# Patient Record
Sex: Female | Born: 1950
Health system: Southern US, Community
[De-identification: ages and names within clinical notes are randomized; demographics above are authoritative.]

## PROBLEM LIST (undated history)

## (undated) DIAGNOSIS — I10 Essential (primary) hypertension: Secondary | ICD-10-CM

## (undated) DIAGNOSIS — N83209 Unspecified ovarian cyst, unspecified side: Secondary | ICD-10-CM

## (undated) DIAGNOSIS — L920 Granuloma annulare: Secondary | ICD-10-CM

## (undated) DIAGNOSIS — N946 Dysmenorrhea, unspecified: Secondary | ICD-10-CM

## (undated) DIAGNOSIS — K219 Gastro-esophageal reflux disease without esophagitis: Secondary | ICD-10-CM

## (undated) DIAGNOSIS — D069 Carcinoma in situ of cervix, unspecified: Secondary | ICD-10-CM

## (undated) DIAGNOSIS — E785 Hyperlipidemia, unspecified: Secondary | ICD-10-CM

## (undated) DIAGNOSIS — T7840XA Allergy, unspecified, initial encounter: Secondary | ICD-10-CM

## (undated) HISTORY — PX: NASAL SINUS SURGERY: SHX719

## (undated) HISTORY — PX: TUBAL LIGATION: SHX77

## (undated) HISTORY — DX: Gastro-esophageal reflux disease without esophagitis: K21.9

## (undated) HISTORY — DX: Hyperlipidemia, unspecified: E78.5

## (undated) HISTORY — DX: Dysmenorrhea, unspecified: N94.6

## (undated) HISTORY — DX: Carcinoma in situ of cervix, unspecified: D06.9

## (undated) HISTORY — PX: POLYPECTOMY: SHX149

## (undated) HISTORY — PX: FOOT SURGERY: SHX648

## (undated) HISTORY — DX: Essential (primary) hypertension: I10

## (undated) HISTORY — DX: Allergy, unspecified, initial encounter: T78.40XA

## (undated) HISTORY — DX: Granuloma annulare: L92.0

## (undated) HISTORY — PX: OVARIAN CYST SURGERY: SHX726

## (undated) HISTORY — PX: VAGINAL HYSTERECTOMY: SUR661

## (undated) HISTORY — DX: Unspecified ovarian cyst, unspecified side: N83.209

---

## 2001-06-20 ENCOUNTER — Encounter: Admission: RE | Admit: 2001-06-20 | Discharge: 2001-06-20 | Payer: Self-pay | Admitting: Family Medicine

## 2003-04-21 ENCOUNTER — Encounter: Payer: Self-pay | Admitting: Family Medicine

## 2003-04-21 ENCOUNTER — Encounter: Admission: RE | Admit: 2003-04-21 | Discharge: 2003-04-21 | Payer: Self-pay | Admitting: Family Medicine

## 2003-06-11 ENCOUNTER — Other Ambulatory Visit: Admission: RE | Admit: 2003-06-11 | Discharge: 2003-06-11 | Payer: Self-pay | Admitting: Obstetrics and Gynecology

## 2004-06-15 ENCOUNTER — Other Ambulatory Visit: Admission: RE | Admit: 2004-06-15 | Discharge: 2004-06-15 | Payer: Self-pay | Admitting: Obstetrics and Gynecology

## 2004-08-15 ENCOUNTER — Ambulatory Visit: Payer: Self-pay | Admitting: Family Medicine

## 2004-08-22 ENCOUNTER — Ambulatory Visit: Payer: Self-pay | Admitting: Family Medicine

## 2005-06-19 ENCOUNTER — Other Ambulatory Visit: Admission: RE | Admit: 2005-06-19 | Discharge: 2005-06-19 | Payer: Self-pay | Admitting: Obstetrics and Gynecology

## 2005-08-23 ENCOUNTER — Ambulatory Visit: Payer: Self-pay | Admitting: Family Medicine

## 2006-03-07 ENCOUNTER — Ambulatory Visit: Payer: Self-pay | Admitting: Family Medicine

## 2006-07-08 ENCOUNTER — Other Ambulatory Visit: Admission: RE | Admit: 2006-07-08 | Discharge: 2006-07-08 | Payer: Self-pay | Admitting: Obstetrics and Gynecology

## 2006-08-26 ENCOUNTER — Ambulatory Visit: Payer: Self-pay | Admitting: Family Medicine

## 2006-08-26 LAB — CONVERTED CEMR LAB
Alkaline Phosphatase: 54 units/L (ref 39–117)
Basophils Relative: 0.2 % (ref 0.0–1.0)
CO2: 30 meq/L (ref 19–32)
Chol/HDL Ratio, serum: 3
Eosinophil percent: 1.6 % (ref 0.0–5.0)
Glomerular Filtration Rate, Af Am: 95 mL/min/{1.73_m2}
Glucose, Bld: 98 mg/dL (ref 70–99)
HCT: 39.6 % (ref 36.0–46.0)
HDL: 81.9 mg/dL (ref >39.0)
Lymphocytes Relative: 26.8 % (ref 12.0–46.0)
Monocytes Absolute: 0.6 10*3/uL (ref 0.2–0.7)
Monocytes Relative: 7.9 % (ref 3.0–11.0)
Platelets: 291 10*3/uL (ref 150–400)
Potassium: 3.5 meq/L (ref 3.5–5.1)
RBC: 4.42 M/uL (ref 3.87–5.11)
RDW: 14.4 % (ref 11.5–14.6)
Sodium: 141 meq/L (ref 135–145)
TSH: 0.97 microintl units/mL (ref 0.35–5.50)
Total Protein: 6.8 g/dL (ref 6.0–8.3)
Triglyceride fasting, serum: 68 mg/dL (ref 0–149)
VLDL: 14 mg/dL (ref 0–40)
WBC: 7.8 10*3/uL (ref 4.5–10.5)

## 2006-11-06 ENCOUNTER — Ambulatory Visit: Payer: Self-pay | Admitting: Family Medicine

## 2007-05-26 ENCOUNTER — Telehealth: Payer: Self-pay | Admitting: Family Medicine

## 2007-06-02 ENCOUNTER — Telehealth: Payer: Self-pay | Admitting: Family Medicine

## 2007-07-09 ENCOUNTER — Telehealth: Payer: Self-pay | Admitting: Family Medicine

## 2007-07-10 ENCOUNTER — Other Ambulatory Visit: Admission: RE | Admit: 2007-07-10 | Discharge: 2007-07-10 | Payer: Self-pay | Admitting: Obstetrics and Gynecology

## 2007-07-14 ENCOUNTER — Telehealth: Payer: Self-pay | Admitting: Family Medicine

## 2007-08-20 ENCOUNTER — Ambulatory Visit: Payer: Self-pay | Admitting: Family Medicine

## 2007-08-20 LAB — CONVERTED CEMR LAB
Specific Gravity, Urine: 1.015
Urobilinogen, UA: 0.2
WBC Urine, dipstick: NEGATIVE

## 2007-08-22 LAB — CONVERTED CEMR LAB
BUN: 10 mg/dL (ref 6–23)
Basophils Absolute: 0 10*3/uL (ref 0.0–0.1)
GFR calc Af Amer: 111 mL/min
GFR calc non Af Amer: 92 mL/min
Glucose, Bld: 89 mg/dL (ref 70–99)
HCT: 38.3 % (ref 36.0–46.0)
Hemoglobin: 13.3 g/dL (ref 12.0–15.0)
TSH: 1.24 microintl units/mL (ref 0.35–5.50)
Total CHOL/HDL Ratio: 3.3
Total Protein: 6.4 g/dL (ref 6.0–8.3)
Triglycerides: 119 mg/dL (ref 0–149)
VLDL: 24 mg/dL (ref 0–40)

## 2007-08-28 ENCOUNTER — Ambulatory Visit: Payer: Self-pay | Admitting: Family Medicine

## 2007-08-28 DIAGNOSIS — G4700 Insomnia, unspecified: Secondary | ICD-10-CM | POA: Insufficient documentation

## 2007-08-28 DIAGNOSIS — J309 Allergic rhinitis, unspecified: Secondary | ICD-10-CM | POA: Insufficient documentation

## 2007-08-28 DIAGNOSIS — E782 Mixed hyperlipidemia: Secondary | ICD-10-CM

## 2007-08-28 DIAGNOSIS — R519 Headache, unspecified: Secondary | ICD-10-CM | POA: Insufficient documentation

## 2007-08-28 DIAGNOSIS — R51 Headache: Secondary | ICD-10-CM | POA: Insufficient documentation

## 2007-08-28 DIAGNOSIS — I10 Essential (primary) hypertension: Secondary | ICD-10-CM | POA: Insufficient documentation

## 2007-09-08 ENCOUNTER — Encounter: Payer: Self-pay | Admitting: Family Medicine

## 2008-02-24 ENCOUNTER — Telehealth: Payer: Self-pay | Admitting: Family Medicine

## 2008-07-14 ENCOUNTER — Ambulatory Visit: Payer: Self-pay | Admitting: Obstetrics and Gynecology

## 2008-07-14 ENCOUNTER — Other Ambulatory Visit: Admission: RE | Admit: 2008-07-14 | Discharge: 2008-07-14 | Payer: Self-pay | Admitting: Obstetrics and Gynecology

## 2008-07-14 ENCOUNTER — Encounter: Payer: Self-pay | Admitting: Obstetrics and Gynecology

## 2008-07-16 ENCOUNTER — Telehealth: Payer: Self-pay | Admitting: Family Medicine

## 2008-08-25 ENCOUNTER — Ambulatory Visit: Payer: Self-pay | Admitting: Family Medicine

## 2008-08-25 LAB — CONVERTED CEMR LAB
Blood in Urine, dipstick: NEGATIVE
Glucose, Urine, Semiquant: NEGATIVE
Specific Gravity, Urine: 1.015
WBC Urine, dipstick: NEGATIVE

## 2008-08-26 LAB — CONVERTED CEMR LAB
ALT: 24 units/L (ref 0–35)
AST: 31 units/L (ref 0–37)
Albumin: 3.8 g/dL (ref 3.5–5.2)
Alkaline Phosphatase: 51 units/L (ref 39–117)
BUN: 12 mg/dL (ref 6–23)
Basophils Absolute: 0 10*3/uL (ref 0.0–0.1)
Bilirubin, Direct: 0.1 mg/dL (ref 0.0–0.3)
CO2: 32 meq/L (ref 19–32)
Chloride: 105 meq/L (ref 96–112)
Cholesterol: 230 mg/dL (ref 0–200)
Eosinophils Absolute: 0.2 10*3/uL (ref 0.0–0.7)
GFR calc Af Amer: 132 mL/min
GFR calc non Af Amer: 109 mL/min
Glucose, Bld: 87 mg/dL (ref 70–99)
HCT: 40.6 % (ref 36.0–46.0)
HDL: 82.5 mg/dL (ref >39.0)
Monocytes Absolute: 0.6 10*3/uL (ref 0.1–1.0)
Monocytes Relative: 8.5 % (ref 3.0–12.0)
Platelets: 210 10*3/uL (ref 150–400)
Potassium: 4 meq/L (ref 3.5–5.1)
RBC: 4.33 M/uL (ref 3.87–5.11)
Total CHOL/HDL Ratio: 2.8
Total Protein: 6.7 g/dL (ref 6.0–8.3)

## 2008-09-01 ENCOUNTER — Ambulatory Visit: Payer: Self-pay | Admitting: Family Medicine

## 2009-06-20 ENCOUNTER — Telehealth: Payer: Self-pay | Admitting: Family Medicine

## 2009-07-11 LAB — CONVERTED CEMR LAB: Pap Smear: NORMAL

## 2009-07-18 ENCOUNTER — Encounter: Payer: Self-pay | Admitting: Obstetrics and Gynecology

## 2009-07-18 ENCOUNTER — Ambulatory Visit: Payer: Self-pay | Admitting: Obstetrics and Gynecology

## 2009-07-18 ENCOUNTER — Other Ambulatory Visit: Admission: RE | Admit: 2009-07-18 | Discharge: 2009-07-18 | Payer: Self-pay | Admitting: Obstetrics and Gynecology

## 2009-08-17 ENCOUNTER — Ambulatory Visit: Payer: Self-pay | Admitting: Family Medicine

## 2009-08-17 LAB — CONVERTED CEMR LAB
AST: 23 units/L (ref 0–37)
Alkaline Phosphatase: 63 units/L (ref 39–117)
BUN: 11 mg/dL (ref 6–23)
Basophils Absolute: 0.1 10*3/uL (ref 0.0–0.1)
Basophils Relative: 0.8 % (ref 0.0–3.0)
Bilirubin, Direct: 0.1 mg/dL (ref 0.0–0.3)
Calcium: 9.4 mg/dL (ref 8.4–10.5)
Cholesterol: 255 mg/dL — ABNORMAL HIGH (ref 0–200)
Creatinine, Ser: 0.7 mg/dL (ref 0.4–1.2)
Direct LDL: 152.8 mg/dL
HCT: 39.5 % (ref 36.0–46.0)
HDL: 83.8 mg/dL (ref >39.00)
Hemoglobin: 13.2 g/dL (ref 12.0–15.0)
Ketones, ur: NEGATIVE mg/dL
Lymphocytes Relative: 24.1 % (ref 12.0–46.0)
MCHC: 33.3 g/dL (ref 30.0–36.0)
MCV: 89.9 fL (ref 78.0–100.0)
Monocytes Absolute: 0.6 10*3/uL (ref 0.1–1.0)
Neutro Abs: 5.4 10*3/uL (ref 1.4–7.7)
Neutrophils Relative %: 66.2 % (ref 43.0–77.0)
Nitrite: NEGATIVE
RBC: 4.4 M/uL (ref 3.87–5.11)
RDW: 13.1 % (ref 11.5–14.6)
Sodium: 143 meq/L (ref 135–145)
Total Bilirubin: 0.8 mg/dL (ref 0.3–1.2)
Total Protein, Urine: NEGATIVE mg/dL
Total Protein: 6.9 g/dL (ref 6.0–8.3)
Triglycerides: 77 mg/dL (ref 0.0–149.0)
Urine Glucose: NEGATIVE mg/dL
Urobilinogen, UA: 0.2 (ref 0.0–1.0)
pH: 7 (ref 5.0–8.0)

## 2009-09-05 ENCOUNTER — Ambulatory Visit: Payer: Self-pay | Admitting: Family Medicine

## 2010-01-17 ENCOUNTER — Telehealth: Payer: Self-pay | Admitting: Family Medicine

## 2010-06-09 ENCOUNTER — Ambulatory Visit: Payer: Self-pay | Admitting: Family Medicine

## 2010-06-09 DIAGNOSIS — D179 Benign lipomatous neoplasm, unspecified: Secondary | ICD-10-CM | POA: Insufficient documentation

## 2010-06-09 DIAGNOSIS — M79609 Pain in unspecified limb: Secondary | ICD-10-CM

## 2010-06-09 DIAGNOSIS — D485 Neoplasm of uncertain behavior of skin: Secondary | ICD-10-CM | POA: Insufficient documentation

## 2010-06-14 ENCOUNTER — Telehealth: Payer: Self-pay | Admitting: Family Medicine

## 2010-06-19 ENCOUNTER — Encounter: Payer: Self-pay | Admitting: Family Medicine

## 2010-07-10 ENCOUNTER — Telehealth: Payer: Self-pay | Admitting: Family Medicine

## 2010-07-10 ENCOUNTER — Encounter: Payer: Self-pay | Admitting: Family Medicine

## 2010-07-26 ENCOUNTER — Ambulatory Visit: Payer: Self-pay | Admitting: Obstetrics and Gynecology

## 2010-07-26 ENCOUNTER — Other Ambulatory Visit: Admission: RE | Admit: 2010-07-26 | Discharge: 2010-07-26 | Payer: Self-pay | Admitting: Obstetrics and Gynecology

## 2010-08-30 ENCOUNTER — Ambulatory Visit: Payer: Self-pay | Admitting: Family Medicine

## 2010-08-30 LAB — CONVERTED CEMR LAB
Glucose, Urine, Semiquant: NEGATIVE
Ketones, urine, test strip: NEGATIVE
Nitrite: NEGATIVE
Specific Gravity, Urine: 1.015

## 2010-09-06 ENCOUNTER — Encounter: Payer: Self-pay | Admitting: Family Medicine

## 2010-09-06 ENCOUNTER — Ambulatory Visit: Payer: Self-pay | Admitting: Family Medicine

## 2010-09-06 LAB — CONVERTED CEMR LAB
BUN: 14 mg/dL (ref 6–23)
Basophils Absolute: 0 10*3/uL (ref 0.0–0.1)
Chloride: 102 meq/L (ref 96–112)
Cholesterol: 255 mg/dL — ABNORMAL HIGH (ref 0–200)
Creatinine, Ser: 0.7 mg/dL (ref 0.4–1.2)
Direct LDL: 168.2 mg/dL
GFR calc non Af Amer: 90.63 mL/min (ref >60.00)
Glucose, Bld: 84 mg/dL (ref 70–99)
HCT: 41 % (ref 36.0–46.0)
HDL: 78.7 mg/dL (ref >39.00)
Lymphs Abs: 1.9 10*3/uL (ref 0.7–4.0)
MCV: 92.2 fL (ref 78.0–100.0)
Monocytes Absolute: 0.5 10*3/uL (ref 0.1–1.0)
Neutrophils Relative %: 64.5 % (ref 43.0–77.0)
Platelets: 206 10*3/uL (ref 150.0–400.0)
Potassium: 3.8 meq/L (ref 3.5–5.1)
RDW: 13.6 % (ref 11.5–14.6)
TSH: 1.2 microintl units/mL (ref 0.35–5.50)
Total Bilirubin: 1.2 mg/dL (ref 0.3–1.2)

## 2010-10-10 NOTE — Progress Notes (Signed)
Summary: answered  Phone Note Call from Patient Call back at Home Phone [phone]   Caller: vm Summary of Call: Last month Forgot to mention to Dr. Clent Ridges that 1) needs note to take Vitamin B, D,E & allergy pill for ear problems for health reimbursement.  Needs note that these are recommended.  Also Dr. Lajoyce Corners who he referred me to about feet recommended Vit B & I told him that I take it.   2) Ambien Rx 30 day.  [employer] prefers we use mail order.  90 day Rx  that I can pick up.  3) Rx Valium requested.   Initial call taken by: Rudy Jew, RN,  July 10, 2010 11:30 AM  Follow-up for Phone Call        what allergy pill does she take? Also I can support taking vitamin B for neuropathy, and D for her bones, but not E. Follow-up by: Nelwyn Salisbury MD,  July 10, 2010 1:05 PM  Additional Follow-up for Phone Call Additional follow up Details #1::        [facility] equiv of Claritin - Wal-trin, one daily. Additional Follow-up by: Rudy Jew, RN,  July 10, 2010 2:07 PM    Additional Follow-up for Phone Call Additional follow up Details #2::    done Follow-up by: Nelwyn Salisbury MD,  July 10, 2010 3:06 PM  Additional Follow-up for Phone Call Additional follow up Details #3:: Details for Additional Follow-up Action Taken: pt called  letter out front  Additional Follow-up by: Pura Spice, RN,  July 10, 2010 3:11 PM  New/Updated Medications: DIAZEPAM 5 MG TABS (DIAZEPAM) two times a day as needed anxiety Prescriptions: DIAZEPAM 5 MG TABS (DIAZEPAM) two times a day as needed anxiety  #180 x 1   Entered and Authorized by:   Nelwyn Salisbury MD   Signed by:   Nelwyn Salisbury MD on 07/10/2010   Method used:   Print then Give to Patient   RxID:   [id number] AMBIEN 10 MG  TABS (ZOLPIDEM TARTRATE) at bedtime  #90 x 1   Entered and Authorized by:   Nelwyn Salisbury MD   Signed by:   Nelwyn Salisbury MD on 07/10/2010   Method used:   Print then Give to Patient   RxID:    [id number]

## 2010-10-10 NOTE — Progress Notes (Signed)
Summary: refill Ambien  Phone Note Refill Request Message from:  Fax from Pharmacy on Jan 17, 2010 10:02 AM  Refills Requested: Medication #1:  AMBIEN 10 MG  TABS at bedtime   Dosage confirmed as above?Dosage Confirmed   Supply Requested: 3 months  Method Requested: Telephone to Pharmacy Initial call taken by: Raechel Ache, RN,  Jan 17, 2010 10:02 AM Caller: [facility]  Follow-up for Phone Call        call in #90 with one rf Follow-up by: Nelwyn Salisbury MD,  Jan 17, 2010 11:59 AM  Additional Follow-up for Phone Call Additional follow up Details #1::        Rx faxed to pharmacy Additional Follow-up by: Raechel Ache, RN,  Jan 17, 2010 12:43 PM    Prescriptions: Remus Loffler 10 MG  TABS (ZOLPIDEM TARTRATE) at bedtime  #90 x 1   Entered by:   Raechel Ache, RN   Authorized by:   Nelwyn Salisbury MD   Signed by:   Raechel Ache, RN on 01/17/2010   Method used:   Historical   RxID:   [id number]

## 2010-10-10 NOTE — Progress Notes (Signed)
Summary: Zpack, Zolpidem Filled  Phone Note Call from Patient   Caller: Patient Call For: Nelwyn Salisbury MD Summary of Call: Pt is complaining of dry, hacking cough and sore throat, and would like a Zpack.  Needs a note stating she needs Vitamin D, B and E for health reimbursement.  Needs refill on Ambien.  [facility] Initial call taken by: Lynann Beaver CMA,  June 14, 2010 8:56 AM  Follow-up for Phone Call        call in Zolpidem, #30 with 5 rf. Also call in a Zpack. I cannot write an order for vitamins. Follow-up by: Nelwyn Salisbury MD,  June 14, 2010 2:17 PM    New/Updated Medications: ZITHROMAX 1 GM PACK (AZITHROMYCIN) Take as directed Prescriptions: AMBIEN 10 MG  TABS (ZOLPIDEM TARTRATE) at bedtime  #30 x 5   Entered by:   Kathrynn Speed CMA   Authorized by:   Nelwyn Salisbury MD   Signed by:   Kathrynn Speed CMA on 06/14/2010   Method used:   Telephoned to ...       [facility]  [phone]* (retail)       [address] [place], [place]  [address]       Ph: [phone] or [phone]       Fax: [phone]   RxID:   [id number] ZITHROMAX 1 GM PACK (AZITHROMYCIN) Take as directed  #1 x 1   Entered by:   Kathrynn Speed CMA   Authorized by:   Nelwyn Salisbury MD   Signed by:   Kathrynn Speed CMA on 06/14/2010   Method used:   Telephoned to ...       [facility]  [phone]* (retail)       [address] [place], [place]  [address]       Ph: [phone] or [phone]       Fax: [phone]   RxID:   [id number]

## 2010-10-10 NOTE — Letter (Signed)
Summary: Requesting Coverage of Over the Counter Medications  Requesting Coverage of Over the Counter Medications   Imported By: Maryln Gottron 07/13/2010 11:26:06  _____________________________________________________________________  External Attachment:    Type:   Image     Comment:   External Document

## 2010-10-10 NOTE — Assessment & Plan Note (Signed)
Summary: fup//ccm   Vital Signs:  Patient profile:   60 year old female Menstrual status:  hysterectomy Weight:      150 pounds O2 Sat:      97 % Temp:     98.4 degrees F Pulse rate:   65 / minute BP sitting:   140 / 84  (left arm)  Vitals Entered By: Pura Spice, RN (June 09, 2010 8:45 AM) CC: painful feet all time.    History of Present Illness: Here for 3 reasons. First she has had chronic pain in both feet for 20 years. She has seen [facility] and had molded arch supports made. These did not help much. Also she has been getting more and more small lesions over her face and forehead that she wants removed. She has seen Dr. Jorja Loa in the past. Last, she has had a mildly tender lump in the left side for several months.   Allergies (verified): No Known Drug Allergies  Past History:  Past Medical History: Reviewed history from 09/01/2008 and no changes required. Allergic rhinitis Headache Hypertension Hyperlipidemia insomnia granuloma annulare (sees [facility]) xerophthalmia , sees Dr. Nile Riggs DEXA 2005, normal sees Dr. Eda Paschal for GYN exams  Past Surgical History: Reviewed history from 08/28/2007 and no changes required. Ovarian cyst removed 1976 Appendectomy Hysterectomy Tubal ligation colonoscopy 01-26-04 per Dr. Juanda Chance, repeat in 10 yrs  Review of Systems  The patient denies anorexia, fever, weight loss, weight gain, vision loss, decreased hearing, hoarseness, chest pain, syncope, dyspnea on exertion, peripheral edema, prolonged cough, headaches, hemoptysis, abdominal pain, melena, hematochezia, severe indigestion/heartburn, hematuria, incontinence, genital sores, muscle weakness, suspicious skin lesions, transient blindness, difficulty walking, depression, unusual weight change, abnormal bleeding, enlarged lymph nodes, angioedema, breast masses, and testicular masses.         Flu Vaccine Consent Questions     Do you have a history of  severe allergic reactions to this vaccine? no    Any prior history of allergic reactions to egg and/or gelatin? no    Do you have a sensitivity to the preservative Thimersol? no    Do you have a past history of Guillan-Barre Syndrome? no    Do you currently have an acute febrile illness? no    Have you ever had a severe reaction to latex? no    Vaccine information given and explained to patient? yes    Are you currently pregnant? no    Lot Number:[id number]   Exp Date:03/10/2011   Site Given  Left Deltoid IM Pura Spice, RN  June 09, 2010 8:48 AM   Physical Exam  General:  Well-developed,well-nourished,in no acute distress; alert,appropriate and cooperative throughout examination Neck:  No deformities, masses, or tenderness noted. Lungs:  Normal respiratory effort, chest expands symmetrically. Lungs are clear to auscultation, no crackles or wheezes. Heart:  Normal rate and regular rhythm. S1 and S2 normal without gallop, murmur, click, rub or other extra sounds. Extremities:  No clubbing, cyanosis, edema, or deformity noted with normal full range of motion of all joints.   Skin:  numerous seborrheic keratoses over her body including the face. Has a small lipoma inferior to the left axilla Axillary Nodes:  No palpable lymphadenopathy   Impression & Recommendations:  Problem # 1:  FOOT PAIN, BILATERAL (ICD-729.5)  Orders: Orthopedic Referral (Ortho)  Problem # 2:  SEBORRHEIC KERATOSIS (ICD-702.19)  Problem # 3:  LIPOMA (ICD-214.9)  Complete Medication List: 1)  Nabumetone 750 Mg Tabs (Nabumetone) .... Two times a  day 2)  Ambien 10 Mg Tabs (Zolpidem tartrate) .... At bedtime 3)  Klor-con M20 20 Meq Tbcr (Potassium chloride crys cr) .... Take 1 tab by mouth daily 4)  Atenolol-chlorthalidone 50-25 Mg Tabs (Atenolol-chlorthalidone) .Marland Kitchen.. 1 by mouth every day 5)  Bl Vitamin E 1000 Unit Caps (Vitamin e) .Marland Kitchen.. 1 by mouth once daily 6)  Bl Vitamin C 500 Mg Tabs (Ascorbic acid)  .Marland Kitchen.. 1 by mouth once daily 7)  Vivelle-dot 0.025 Mg/24hr Pttw (Estradiol) .... Once daily 8)  Sodium Chloride Opthalmic Ointment 5%  .... At bedtime  Other Orders: Admin 1st Vaccine (16109) Flu Vaccine 39yrs + (60454) Dermatology Referral (Derma)  Patient Instructions: 1)  Reassured her about the lipoma. will refer to Orthopedics for the feet and to Dr. Jorja Loa for her skin.

## 2010-10-10 NOTE — Consult Note (Signed)
Summary: [facility]   Imported By: Maryln Gottron 06/27/2010 11:11:43  _____________________________________________________________________  External Attachment:    Type:   Image     Comment:   External Document

## 2010-10-12 NOTE — Assessment & Plan Note (Signed)
Summary: cpx/njr   Vital Signs:  Patient profile:   60 year old female Menstrual status:  hysterectomy Height:      65.5 inches Weight:      154 pounds BMI:     25.33 O2 Sat:      97 % Temp:     98.5 degrees F Pulse rate:   68 / minute BP sitting:   140 / 80  (left arm) Cuff size:   regular  Vitals Entered By: Pura Spice, RN (September 06, 2010 8:59 AM) CC: cpx without pap .    History of Present Illness: 59 yr old female for a cpx. She feels fine and has no complaints. Her recent labs showed an excellent HDL of 78 but a very high LDL at 168. She admits to eating a poor diet lately including a lot of fatty and fried foods.   Allergies (verified): No Known Drug Allergies  Past History:  Past Medical History: Reviewed history from 09/01/2008 and no changes required. Allergic rhinitis Headache Hypertension Hyperlipidemia insomnia granuloma annulare (sees [facility]) xerophthalmia , sees Dr. Nile Riggs DEXA 2005, normal sees Dr. Eda Paschal for GYN exams  Past Surgical History: Reviewed history from 08/28/2007 and no changes required. Ovarian cyst removed 1976 Appendectomy Hysterectomy Tubal ligation colonoscopy 01-26-04 per Dr. Juanda Chance, repeat in 10 yrs  Past History:  Care Management: Gastroenterology: Corinda Gubler Gynecology: Dr Dianne Dun  Family History: Reviewed history from 08/28/2007 and no changes required. Family History of Arthritis Family History Hypertension  Social History: Reviewed history from 08/28/2007 and no changes required. Divorced Former Smoker Alcohol use-yes Drug use-yes  Review of Systems  The patient denies anorexia, fever, weight loss, weight gain, vision loss, decreased hearing, hoarseness, chest pain, syncope, dyspnea on exertion, peripheral edema, prolonged cough, headaches, hemoptysis, abdominal pain, melena, hematochezia, severe indigestion/heartburn, hematuria, incontinence, genital sores, muscle weakness, suspicious  skin lesions, transient blindness, difficulty walking, depression, unusual weight change, abnormal bleeding, enlarged lymph nodes, angioedema, breast masses, and testicular masses.    Physical Exam  General:  Well-developed,well-nourished,in no acute distress; alert,appropriate and cooperative throughout examination Head:  Normocephalic and atraumatic without obvious abnormalities. No apparent alopecia or balding. Eyes:  No corneal or conjunctival inflammation noted. EOMI. Perrla. Funduscopic exam benign, without hemorrhages, exudates or papilledema. Vision grossly normal. Ears:  External ear exam shows no significant lesions or deformities.  Otoscopic examination reveals clear canals, tympanic membranes are intact bilaterally without bulging, retraction, inflammation or discharge. Hearing is grossly normal bilaterally. Nose:  External nasal examination shows no deformity or inflammation. Nasal mucosa are pink and moist without lesions or exudates. Mouth:  Oral mucosa and oropharynx without lesions or exudates.  Teeth in good repair. Neck:  No deformities, masses, or tenderness noted. Chest Wall:  No deformities, masses, or tenderness noted. Lungs:  Normal respiratory effort, chest expands symmetrically. Lungs are clear to auscultation, no crackles or wheezes. Heart:  Normal rate and regular rhythm. S1 and S2 normal without gallop, murmur, click, rub or other extra sounds. EKG normal  Abdomen:  Bowel sounds positive,abdomen soft and non-tender without masses, organomegaly or hernias noted. Msk:  No deformity or scoliosis noted of thoracic or lumbar spine.   Pulses:  R and L carotid,radial,femoral,dorsalis pedis and posterior tibial pulses are full and equal bilaterally Extremities:  No clubbing, cyanosis, edema, or deformity noted with normal full range of motion of all joints.   Neurologic:  No cranial nerve deficits noted. Station and gait are normal. Plantar reflexes are down-going bilaterally.  DTRs  are symmetrical throughout. Sensory, motor and coordinative functions appear intact. Skin:  Intact without suspicious lesions or rashes Cervical Nodes:  No lymphadenopathy noted Axillary Nodes:  No palpable lymphadenopathy Inguinal Nodes:  No significant adenopathy Psych:  Cognition and judgment appear intact. Alert and cooperative with normal attention span and concentration. No apparent delusions, illusions, hallucinations   Impression & Recommendations:  Problem # 1:  WELL ADULT EXAM (ICD-V70.0)  Orders: EKG w/ Interpretation (93000)  Complete Medication List: 1)  Nabumetone 750 Mg Tabs (Nabumetone) .... Two times a day 2)  Ambien 10 Mg Tabs (Zolpidem tartrate) .... At bedtime 3)  Klor-con M20 20 Meq Tbcr (Potassium chloride crys cr) .... Take 1 tab by mouth daily 4)  Atenolol-chlorthalidone 50-25 Mg Tabs (Atenolol-chlorthalidone) .Marland Kitchen.. 1 by mouth every day 5)  Bl Vitamin E 1000 Unit Caps (Vitamin e) .Marland Kitchen.. 1 by mouth once daily 6)  Bl Vitamin C 500 Mg Tabs (Ascorbic acid) .Marland Kitchen.. 1 by mouth once daily 7)  Vivelle-dot 0.025 Mg/24hr Pttw (Estradiol) .... Once daily 8)  Sodium Chloride Opthalmic Ointment 5%  .... At bedtime 9)  Diazepam 5 Mg Tabs (Diazepam) .... Two times a day as needed anxiety 10)  Claritin 10 Mg Tabs (Loratadine)  Other Orders: Tdap => 68yrs IM (16109) Admin 1st Vaccine (60454)  Patient Instructions: 1)  we discussed her making some major changes in her diet, and she agreed. We will recheck lipids in 6 months.  Prescriptions: ATENOLOL-CHLORTHALIDONE 50-25 MG TABS (ATENOLOL-CHLORTHALIDONE) 1 by mouth every day  #90 x 3   Entered and Authorized by:   Nelwyn Salisbury MD   Signed by:   Nelwyn Salisbury MD on 09/06/2010   Method used:   Print then Give to Patient   RxID:   [id number] NABUMETONE 750 MG  TABS (NABUMETONE) two times a day  #180 x 3   Entered and Authorized by:   Nelwyn Salisbury MD   Signed by:   Nelwyn Salisbury MD on 09/06/2010   Method used:   Print  then Give to Patient   RxID:   [id number]    Orders Added: 1)  Tdap => 80yrs IM [41324] 2)  Admin 1st Vaccine [90471] 3)  Est. Patient 40-64 years [99396] 4)  EKG w/ Interpretation [93000]   Immunizations Administered:  Tetanus Vaccine:    Vaccine Type: Tdap    Site: left deltoid    Mfr: GlaxoSmithKline    Dose: 0.5 ml    Route: IM    Given by: Pura Spice, RN    Exp. Date: 06/29/2012    Lot #: [id number]    VIS given: 07/28/08 version given September 06, 2010.   Immunizations Administered:  Tetanus Vaccine:    Vaccine Type: Tdap    Site: left deltoid    Mfr: GlaxoSmithKline    Dose: 0.5 ml    Route: IM    Given by: Pura Spice, RN    Exp. Date: 06/29/2012    Lot #: [id number]    VIS given: 07/28/08 version given September 06, 2010.

## 2010-12-11 ENCOUNTER — Other Ambulatory Visit: Payer: Self-pay

## 2010-12-11 NOTE — Telephone Encounter (Signed)
Refill zolpidem tartrate 10mg 

## 2010-12-12 ENCOUNTER — Telehealth: Payer: Self-pay

## 2010-12-12 MED ORDER — ZOLPIDEM TARTRATE 10 MG PO TABS: 10.0000 mg | ORAL_TABLET | Freq: Every evening | ORAL | Status: DC | PRN

## 2010-12-12 NOTE — Telephone Encounter (Signed)
Called in [place]

## 2010-12-12 NOTE — Telephone Encounter (Signed)
Opened in error

## 2010-12-12 NOTE — Telephone Encounter (Signed)
Call in #30 with 5 rf 

## 2011-01-26 NOTE — Assessment & Plan Note (Signed)
[facility] OFFICE NOTE   Melody Sutton, Melody Sutton                     MRN:          [id number]  DATE:08/26/2006                            DOB:          [birth year]    This is a 60 year old woman here for a nongynecological physical exam.  In general, she is doing well and has no complaints at all.  Her joint  pains are under good control.  She is sleeping well on Ambien.  She is  off of Nexium and has good control of her acid reflux with an over-the-  counter herb she has been taking for the past month or so.  She  continues to see Dr. Eda Paschal for gynecology exams.  She had a normal  colonoscopy in 2005 and a 10-year followup was recommended at that time.  For further details of her past medical history, family history, social  history, habits, etc., refer to her last physical note dated August 24, 2005.   ALLERGIES:  None.   CURRENT MEDICATIONS:  1. Vivelle-Dot weekly.  2. Calcium and vitamin D daily.  3. Tenoretic 50/25 daily.  4. K-Dur 20 mEq daily.  5. Relafen 750 mg two tablets daily.  6. Ambien CR 12.5 mg at bedtime.   OBJECTIVE:  VITAL SIGNS:  Height 66 inches, weight 151 pounds, BP  130/80, pulse 70 and regular.  GENERAL:  She appears to be at her baseline.  SKIN:  Clear.  HEENT:  Eyes are clear, she wears glasses.  Ears are clear, pharynx  clear.  NECK:  Supple without lymphadenopathy or masses.  LUNGS:  Clear.  CARDIAC:  Rate and rhythm are regular without gallops, murmurs, rubs.  Distal pulses are full.  ABDOMEN:  Soft, normal bowel sounds, nontender, no masses.  EXTREMITIES:  No clubbing, cyanosis, or edema.  NEUROLOGIC:  Grossly intact.   ASSESSMENT AND PLAN:  1. Complete physical.  She is fasting so I will check the usual      laboratories today.  2. Hypertension, stable.  3. Insomnia, stable.  4. Gastroesophageal reflux disease, stable.  5. Degenerative joint disease,  stable.    Tera Mater. Clent Ridges, MD  Electronically Signed   SAF/MedQ  DD: 08/26/2006  DT: 08/26/2006  Job #: [id number]

## 2011-03-20 ENCOUNTER — Telehealth: Payer: Self-pay | Admitting: Family Medicine

## 2011-03-20 NOTE — Telephone Encounter (Signed)
Pt needs 3 scripts for her health savings plan ( flex spending account). #1 Claritin, #2 Vit D, #3 Vit B Complex. She is not sure of the correct dosage. She said that you helped with this in the past. She would like to pick up when ready.

## 2011-03-26 NOTE — Telephone Encounter (Signed)
Pt.notified

## 2011-03-26 NOTE — Telephone Encounter (Signed)
Please tell her that with our new Epic system we are no longer able to prescribe OTC meds

## 2011-05-19 ENCOUNTER — Other Ambulatory Visit: Payer: Self-pay | Admitting: Family Medicine

## 2011-05-23 NOTE — Telephone Encounter (Signed)
Scripts sent e-scribe and pt needs a office visit.

## 2011-06-13 ENCOUNTER — Other Ambulatory Visit: Payer: Self-pay

## 2011-06-13 NOTE — Telephone Encounter (Signed)
Rx request for zolpidem tartrate 10 mg tablet #30. Pt last seen 09/06/10. Pls advise.

## 2011-06-14 MED ORDER — ZOLPIDEM TARTRATE 10 MG PO TABS: 10.0000 mg | ORAL_TABLET | Freq: Every evening | ORAL | Status: DC | PRN

## 2011-06-14 NOTE — Telephone Encounter (Signed)
rx has been called into the pharmacy

## 2011-06-14 NOTE — Telephone Encounter (Signed)
Call in #30 with 5 rf 

## 2011-06-29 ENCOUNTER — Ambulatory Visit (INDEPENDENT_AMBULATORY_CARE_PROVIDER_SITE_OTHER): Payer: 59 | Admitting: Family Medicine

## 2011-06-29 DIAGNOSIS — E539 Vitamin B deficiency, unspecified: Secondary | ICD-10-CM

## 2011-06-29 MED ORDER — CYANOCOBALAMIN 1000 MCG/ML IJ SOLN
1000.0000 ug | Freq: Once | INTRAMUSCULAR | Status: AC
  Administered 2011-06-29: 1000 ug via INTRAMUSCULAR

## 2011-07-25 ENCOUNTER — Telehealth: Payer: Self-pay | Admitting: *Deleted

## 2011-08-03 ENCOUNTER — Ambulatory Visit (INDEPENDENT_AMBULATORY_CARE_PROVIDER_SITE_OTHER): Payer: 59

## 2011-08-03 DIAGNOSIS — Z23 Encounter for immunization: Secondary | ICD-10-CM

## 2011-08-10 ENCOUNTER — Other Ambulatory Visit: Payer: Self-pay | Admitting: Obstetrics and Gynecology

## 2011-08-29 ENCOUNTER — Encounter: Payer: Self-pay | Admitting: Gynecology

## 2011-08-29 DIAGNOSIS — D069 Carcinoma in situ of cervix, unspecified: Secondary | ICD-10-CM | POA: Insufficient documentation

## 2011-08-29 DIAGNOSIS — L920 Granuloma annulare: Secondary | ICD-10-CM | POA: Insufficient documentation

## 2011-08-29 DIAGNOSIS — N946 Dysmenorrhea, unspecified: Secondary | ICD-10-CM | POA: Insufficient documentation

## 2011-08-29 DIAGNOSIS — N83209 Unspecified ovarian cyst, unspecified side: Secondary | ICD-10-CM | POA: Insufficient documentation

## 2011-08-29 DIAGNOSIS — I10 Essential (primary) hypertension: Secondary | ICD-10-CM | POA: Insufficient documentation

## 2011-09-06 ENCOUNTER — Other Ambulatory Visit (HOSPITAL_COMMUNITY)
Admission: RE | Admit: 2011-09-06 | Discharge: 2011-09-06 | Disposition: A | Discharge status: Home / Self Care | Payer: 59 | Source: Ambulatory Visit | Attending: Obstetrics and Gynecology | Admitting: Obstetrics and Gynecology

## 2011-09-06 ENCOUNTER — Encounter: Payer: Self-pay | Admitting: Obstetrics and Gynecology

## 2011-09-06 ENCOUNTER — Ambulatory Visit (INDEPENDENT_AMBULATORY_CARE_PROVIDER_SITE_OTHER): Payer: 59 | Admitting: Obstetrics and Gynecology

## 2011-09-06 VITALS — BP 116/74 | Ht 65.5 in | Wt 150.0 lb

## 2011-09-06 DIAGNOSIS — Z01419 Encounter for gynecological examination (general) (routine) without abnormal findings: Secondary | ICD-10-CM | POA: Insufficient documentation

## 2011-09-06 MED ORDER — VIVELLE-DOT 0.05 MG/24HR TD PTTW: 1.0000 | MEDICATED_PATCH | TRANSDERMAL | Status: DC

## 2011-09-06 NOTE — Progress Notes (Signed)
Patient came to see me today for her annual GYN exam. She's been married since I last saw her. She remains on her estrogen patch with excellent results. She is scheduled her mammogram. She does her lab work from her PCP. She's having no vaginal bleeding or pelvic pain. She is having no menopausal symptoms on her HRT. While  HEENT: Within normal limits.  Melody Sutton present. Neck: No masses. Supraclavicular lymph nodes: Not enlarged. Breasts: Examined in both sitting and lying position. Symmetrical without skin changes or masses. Abdomen: Soft no masses guarding or rebound. No hernias. Pelvic: External within normal limits. BUS within normal limits. Vaginal examination shows good estrogen effect, no cystocele enterocele or rectocele. Cervix and uterus absent. Adnexa within normal limits. Rectovaginal confirmatory. Extremities within normal limits.  Assessment: #1. Menopausal symptoms #2. CIN-3 status post vaginal hysterectomy  Plan: Continue Vivelle patch. Continue yearly mammograms. Patient to check and see if she is due for another colonoscopy.

## 2011-09-07 ENCOUNTER — Other Ambulatory Visit: Payer: Self-pay | Admitting: Family Medicine

## 2011-09-10 ENCOUNTER — Encounter: Payer: Self-pay | Admitting: Family Medicine

## 2011-09-10 ENCOUNTER — Ambulatory Visit (INDEPENDENT_AMBULATORY_CARE_PROVIDER_SITE_OTHER): Payer: 59 | Admitting: Family Medicine

## 2011-09-10 VITALS — BP 118/78 | HR 68 | Temp 98.0°F | Ht 65.5 in | Wt 151.0 lb

## 2011-09-10 DIAGNOSIS — E785 Hyperlipidemia, unspecified: Secondary | ICD-10-CM

## 2011-09-10 DIAGNOSIS — Z0000 Encounter for general adult medical examination without abnormal findings: Secondary | ICD-10-CM

## 2011-09-10 LAB — BASIC METABOLIC PANEL
BUN: 10 mg/dL (ref 6–23)
Calcium: 9.1 mg/dL (ref 8.4–10.5)
Creatinine, Ser: 0.6 mg/dL (ref 0.4–1.2)
GFR: 103.9 mL/min (ref >60.00)

## 2011-09-10 LAB — POCT URINALYSIS DIPSTICK
Bilirubin, UA: NEGATIVE
Ketones, UA: NEGATIVE
Leukocytes, UA: NEGATIVE
Protein, UA: NEGATIVE

## 2011-09-10 LAB — CBC WITH DIFFERENTIAL/PLATELET
Basophils Relative: 0.2 % (ref 0.0–3.0)
Eosinophils Relative: 1.4 % (ref 0.0–5.0)
HCT: 38.5 % (ref 36.0–46.0)
Hemoglobin: 12.9 g/dL (ref 12.0–15.0)
Lymphs Abs: 2 10*3/uL (ref 0.7–4.0)
Monocytes Relative: 5.1 % (ref 3.0–12.0)
Neutro Abs: 8.6 10*3/uL — ABNORMAL HIGH (ref 1.4–7.7)
RBC: 4.31 Mil/uL (ref 3.87–5.11)
WBC: 11.4 10*3/uL — ABNORMAL HIGH (ref 4.5–10.5)

## 2011-09-10 LAB — LIPID PANEL
Cholesterol: 288 mg/dL — ABNORMAL HIGH (ref 0–200)
HDL: 88.7 mg/dL (ref >39.00)
Triglycerides: 92 mg/dL (ref 0.0–149.0)
VLDL: 18.4 mg/dL (ref 0.0–40.0)

## 2011-09-10 LAB — HEPATIC FUNCTION PANEL
ALT: 20 U/L (ref 0–35)
Total Bilirubin: 0.7 mg/dL (ref 0.3–1.2)

## 2011-09-10 LAB — LDL CHOLESTEROL, DIRECT: Direct LDL: 183.7 mg/dL

## 2011-09-10 LAB — TSH: TSH: 1.28 u[IU]/mL (ref 0.35–5.50)

## 2011-09-10 MED ORDER — ZOLPIDEM TARTRATE 10 MG PO TABS: 10.0000 mg | ORAL_TABLET | Freq: Every evening | ORAL | Status: DC | PRN

## 2011-09-10 MED ORDER — MELOXICAM 15 MG PO TABS: 15.0000 mg | ORAL_TABLET | Freq: Every day | ORAL | Status: DC

## 2011-09-10 MED ORDER — ATENOLOL-CHLORTHALIDONE 50-25 MG PO TABS: 1.0000 | ORAL_TABLET | Freq: Every day | ORAL | Status: DC

## 2011-09-10 MED ORDER — POTASSIUM CHLORIDE CRYS ER 20 MEQ PO TBCR: 20.0000 meq | EXTENDED_RELEASE_TABLET | Freq: Every day | ORAL | Status: DC

## 2011-09-10 NOTE — Progress Notes (Signed)
  Subjective:    Patient ID: Melody Sutton, female    DOB: [birth year], 60 y.o.   MRN: [id number]  HPI 60 yr old female for a cpx. She is doing well with the exception of her joints are aching more lately despite taking Relafen. She has been on this for some time.    Review of Systems  Constitutional: Negative.   HENT: Negative.   Eyes: Negative.   Respiratory: Negative.   Cardiovascular: Negative.   Gastrointestinal: Negative.   Genitourinary: Negative for dysuria, urgency, frequency, hematuria, flank pain, decreased urine volume, enuresis, difficulty urinating, pelvic pain and dyspareunia.  Musculoskeletal: Positive for arthralgias. Negative for myalgias, back pain, joint swelling and gait problem.  Skin: Negative.   Neurological: Negative.   Hematological: Negative.   Psychiatric/Behavioral: Negative.        Objective:   Physical Exam  Constitutional: She is oriented to person, place, and time. She appears well-developed and well-nourished. No distress.  HENT:  Head: Normocephalic and atraumatic.  Right Ear: External ear normal.  Left Ear: External ear normal.  Nose: Nose normal.  Mouth/Throat: Oropharynx is clear and moist. No oropharyngeal exudate.  Eyes: Conjunctivae and EOM are normal. Pupils are equal, round, and reactive to light. No scleral icterus.  Neck: Normal range of motion. Neck supple. No JVD present. No thyromegaly present.  Cardiovascular: Normal rate, regular rhythm, normal heart sounds and intact distal pulses.  Exam reveals no gallop and no friction rub.   No murmur heard.      EKG normal   Pulmonary/Chest: Effort normal and breath sounds normal. No respiratory distress. She has no wheezes. She has no rales. She exhibits no tenderness.  Abdominal: Soft. Bowel sounds are normal. She exhibits no distension and no mass. There is no tenderness. There is no rebound and no guarding.  Musculoskeletal: Normal range of motion. She exhibits no edema and no  tenderness.  Lymphadenopathy:    She has no cervical adenopathy.  Neurological: She is alert and oriented to person, place, and time. She has normal reflexes. No cranial nerve deficit. She exhibits normal muscle tone. Coordination normal.  Skin: Skin is warm and dry. No rash noted. No erythema.  Psychiatric: She has a normal mood and affect. Her behavior is normal. Judgment and thought content normal.          Assessment & Plan:  Well exam. Refilled meds, but we will try Meloxicam for her arthritis. Get another DEXA soon.

## 2011-09-12 MED ORDER — NABUMETONE 750 MG PO TABS: ORAL_TABLET | ORAL | Status: DC

## 2011-09-12 NOTE — Telephone Encounter (Signed)
I called [facility] and these 2 orders have been processed.

## 2011-09-13 ENCOUNTER — Encounter: Payer: Self-pay | Admitting: Family Medicine

## 2011-09-13 MED ORDER — ATORVASTATIN CALCIUM 40 MG PO TABS: 40.0000 mg | ORAL_TABLET | Freq: Every day | ORAL | Status: DC

## 2011-09-13 NOTE — Progress Notes (Signed)
Addended by: Aniceto Boss A on: 09/13/2011 02:35 PM   Modules accepted: Orders

## 2011-09-13 NOTE — Progress Notes (Signed)
Quick Note:  Left voice message, put a copy of results in mail, script sent e-scribe and the future lab order was already in system. ______

## 2011-09-14 ENCOUNTER — Encounter: Payer: Self-pay | Admitting: Obstetrics and Gynecology

## 2011-10-01 ENCOUNTER — Ambulatory Visit (INDEPENDENT_AMBULATORY_CARE_PROVIDER_SITE_OTHER)
Admission: RE | Admit: 2011-10-01 | Discharge: 2011-10-01 | Disposition: A | Discharge status: Home / Self Care | Payer: 59 | Source: Ambulatory Visit

## 2011-10-01 DIAGNOSIS — Z13820 Encounter for screening for osteoporosis: Secondary | ICD-10-CM

## 2011-10-01 DIAGNOSIS — Z0000 Encounter for general adult medical examination without abnormal findings: Secondary | ICD-10-CM

## 2011-10-11 ENCOUNTER — Encounter: Payer: Self-pay | Admitting: Family Medicine

## 2011-11-02 NOTE — Telephone Encounter (Signed)
Opened in error

## 2011-12-31 ENCOUNTER — Other Ambulatory Visit: Payer: Self-pay | Admitting: Family Medicine

## 2012-01-04 ENCOUNTER — Telehealth: Payer: Self-pay

## 2012-01-04 NOTE — Telephone Encounter (Signed)
Melody Sutton - make change to fax i left with you and fax back I made change to med list

## 2012-01-04 NOTE — Telephone Encounter (Signed)
Faxed back to pharmacy

## 2012-01-04 NOTE — Telephone Encounter (Signed)
Fax request from [facility] needing clarification on possible duplicate tx - Namumntone 750 - rx'd on 12/31/11 Med list also has mobic 15mg  - last written 09/10/2011 Please advise is pt using both meds? You last saw 09/10/11 for cpx

## 2012-01-04 NOTE — Telephone Encounter (Signed)
At he last OV on 09-10-11 we stopped Nabumetone and switched to Mobic. She should be on Mobic only

## 2012-03-11 ENCOUNTER — Other Ambulatory Visit: Payer: Self-pay | Admitting: Family Medicine

## 2012-06-10 ENCOUNTER — Telehealth: Payer: Self-pay | Admitting: Family Medicine

## 2012-06-10 MED ORDER — ATORVASTATIN CALCIUM 40 MG PO TABS: 40.0000 mg | ORAL_TABLET | Freq: Every day | ORAL | Status: DC

## 2012-06-10 NOTE — Telephone Encounter (Signed)
Refill request for Atorvastatin and I did send to [facility].

## 2012-09-01 ENCOUNTER — Other Ambulatory Visit: Payer: Self-pay | Admitting: *Deleted

## 2012-09-01 ENCOUNTER — Other Ambulatory Visit: Payer: Self-pay | Admitting: Family Medicine

## 2012-09-01 MED ORDER — ATENOLOL-CHLORTHALIDONE 50-25 MG PO TABS: 1.0000 | ORAL_TABLET | Freq: Every day | ORAL | Status: DC

## 2012-09-01 NOTE — Telephone Encounter (Signed)
Patient request a new prescription printed for [facility].  Left message on machine for patient Rx ready for pick up.

## 2012-09-11 ENCOUNTER — Other Ambulatory Visit: Payer: Self-pay | Admitting: Family Medicine

## 2012-09-22 ENCOUNTER — Other Ambulatory Visit (HOSPITAL_COMMUNITY)
Admission: RE | Admit: 2012-09-22 | Discharge: 2012-09-22 | Disposition: A | Discharge status: Home / Self Care | Payer: 59 | Source: Ambulatory Visit | Attending: Gynecology | Admitting: Gynecology

## 2012-09-22 ENCOUNTER — Ambulatory Visit (INDEPENDENT_AMBULATORY_CARE_PROVIDER_SITE_OTHER): Payer: 59 | Admitting: Gynecology

## 2012-09-22 ENCOUNTER — Encounter: Payer: Self-pay | Admitting: Gynecology

## 2012-09-22 VITALS — BP 128/70 | Ht 65.5 in | Wt 154.0 lb

## 2012-09-22 DIAGNOSIS — Z01419 Encounter for gynecological examination (general) (routine) without abnormal findings: Secondary | ICD-10-CM | POA: Insufficient documentation

## 2012-09-22 DIAGNOSIS — Z79890 Hormone replacement therapy (postmenopausal): Secondary | ICD-10-CM

## 2012-09-22 DIAGNOSIS — Z1151 Encounter for screening for human papillomavirus (HPV): Secondary | ICD-10-CM | POA: Insufficient documentation

## 2012-09-22 DIAGNOSIS — N951 Menopausal and female climacteric states: Secondary | ICD-10-CM | POA: Insufficient documentation

## 2012-09-22 MED ORDER — VIVELLE-DOT 0.05 MG/24HR TD PTTW: 1.0000 | MEDICATED_PATCH | TRANSDERMAL | Status: DC

## 2012-09-22 NOTE — Progress Notes (Signed)
Melody Sutton 28-Apr-1951 [id number]   History:    62 y.o.  for annual gyn exam with no complaints today. 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 Follow up pap smears have been normal. Patient has been doing well on twice a week Vivelle Dot (0.05 mg). Review of records indicated that her last colonoscopy was normal in 2005. History of benign ovarian cystectomy in [place] in 1975. Dr. Clent Ridges is her primary physician who will be doing her labs next week. Normal bone density in 2013. Tdap in 2011. Shingles  Vaccine?? Mammogram 2012 normal.  Past medical history,surgical history, family history and social history were all reviewed and documented in the EPIC chart.  Gynecologic History Patient's last menstrual period was 09/10/1978. Contraception: post menopausal status Last Pap: 2012 . Results were: normal Last mammogram: 2012. Results were: normal  Obstetric History OB History    Grav Para Term Preterm Abortions TAB SAB Ect Mult Living   1    1          # Outc Date GA Lbr Len/2nd Wgt Sex Del Anes PTL Lv   1 ABT                ROS: A ROS was performed and pertinent positives and negatives are included in the history.  GENERAL: No fevers or chills. HEENT: No change in vision, no earache, sore throat or sinus congestion. NECK: No pain or stiffness. CARDIOVASCULAR: No chest pain or pressure. No palpitations. PULMONARY: No shortness of breath, cough or wheeze. GASTROINTESTINAL: No abdominal pain, nausea, vomiting or diarrhea, melena or bright red blood per rectum. GENITOURINARY: No urinary frequency, urgency, hesitancy or dysuria. MUSCULOSKELETAL: No joint or muscle pain, no back pain, no recent trauma. DERMATOLOGIC: No rash, no itching, no lesions. ENDOCRINE: No polyuria, polydipsia, no heat or cold intolerance. No recent change in weight. HEMATOLOGICAL: No anemia or easy bruising or bleeding. NEUROLOGIC: No headache, seizures, numbness, tingling or weakness. PSYCHIATRIC: No  depression, no loss of interest in normal activity or change in sleep pattern.     Exam: chaperone present  BP 128/70  Ht 5' 5.5" (1.664 m)  Wt 154 lb (69.854 kg)  BMI 25.24 kg/m2  LMP 09/10/1978  Body mass index is 25.24 kg/(m^2).  General appearance : Well developed well nourished female. No acute distress HEENT: Neck supple, trachea midline, no carotid bruits, no thyroidmegaly Lungs: Clear to auscultation, no rhonchi or wheezes, or rib retractions  Heart: Regular rate and rhythm, no murmurs or gallops Breast:Examined in sitting and supine position were symmetrical in appearance, no palpable masses or tenderness,  no skin retraction, no nipple inversion, no nipple discharge, no skin discoloration, no axillary or supraclavicular lymphadenopathy Abdomen: no palpable masses or tenderness, no rebound or guarding Extremities: no edema or skin discoloration or tenderness   multiple senile keratotic lesions seen. 3 hyperpigment ones were note: right side of neck, left breast upper outer quadrant and left side of med abdomen  Pelvic:  Bartholin, Urethra, Skene Glands: Within normal limits             Vagina: No gross lesions or discharge  Cervix: absent  Uterus absent  Adnexa  Without masses or tenderness  Anus and perineum  normal   Rectovaginal  normal sphincter tone without palpated masses or tenderness             Hemoccult cards provided     Assessment/Plan:  62 y.o. female for annual exam . Patient  will be referred to dermatologist for possible skin biopsies:  multiple senile keratotic lesions seen. 3 hyperpigment ones were note: right side of neck, left breast upper outer quadrant and left side of med abdomen. No labs drawn. Pap smear done today. Literature information on Shingles Vaccine provide. Importance of calcium, vitamin D and regular exercise for osteoporosis provided. Patient to submit to office hemoccult card for testing.     Ok Edwards MD, 8:44 PM  09/22/2012

## 2012-09-22 NOTE — Patient Instructions (Addendum)
Shingles Vaccine What You Need to Know WHAT IS SHINGLES?  Shingles is a painful skin rash, often with blisters. It is also called Herpes Zoster or just Zoster.  A shingles rash usually appears on one side of the face or body and lasts from 2 to 4 weeks. Its main symptom is pain, which can be quite severe. Other symptoms of shingles can include fever, headache, chills, and upset stomach. Very rarely, a shingles infection can lead to pneumonia, hearing problems, blindness, brain inflammation (encephalitis), or death.  For about 1 person in 5, severe pain can continue even after the rash clears up. This is called post-herpetic neuralgia.  Shingles is caused by the Varicella Zoster virus. This is the same virus that causes chickenpox. Only someone who has had a case of chickenpox or rarely, has gotten chickenpox vaccine, can get shingles. The virus stays in your body. It can reappear many years later to cause a case of shingles.  You cannot catch shingles from another person with shingles. However, a person who has never had chickenpox (or chickenpox vaccine) could get chickenpox from someone with shingles. This is not very common.  Shingles is far more common in people 63 and older than in younger people. It is also more common in people whose immune systems are weakened because of a disease such as cancer or drugs such as steroids or chemotherapy.  At least 1 million people get shingles per year in the [place]. SHINGLES VACCINE  A vaccine for shingles was licensed in 2006. In clinical trials, the vaccine reduced the risk of shingles by 50%. It can also reduce the pain in people who still get shingles after being vaccinated.  A single dose of shingles vaccine is recommended for adults 63 years of age and older. SOME PEOPLE SHOULD NOT GET SHINGLES VACCINE OR SHOULD WAIT A person should not get shingles vaccine if he or she:  Has ever had a life-threatening allergic reaction to gelatin, the  antibiotic neomycin, or any other component of shingles vaccine. Tell your caregiver if you have any severe allergies.  Has a weakened immune system because of current:  AIDS or another disease that affects the immune system.  Treatment with drugs that affect the immune system, such as prolonged use of high-dose steroids.  Cancer treatment, such as radiation or chemotherapy.  Cancer affecting the bone marrow or lymphatic system, such as leukemia or lymphoma.  Is pregnant, or might be pregnant. Women should not become pregnant until at least 4 weeks after getting shingles vaccine. Someone with a minor illness, such as a cold, may be vaccinated. Anyone with a moderate or severe acute illness should usually wait until he or she recovers before getting the vaccine. This includes anyone with a temperature of 101.3 F (38 C) or higher. WHAT ARE THE RISKS FROM SHINGLES VACCINE?  A vaccine, like any medicine, could possibly cause serious problems, such as severe allergic reactions. However, the risk of a vaccine causing serious harm, or death, is extremely small.  No serious problems have been identified with shingles vaccine. Mild Problems  Redness, soreness, swelling, or itching at the site of the injection (about 1 person in 3).  Headache (about 1 person in 70). Like all vaccines, shingles vaccine is being closely monitored for unusual or severe problems. WHAT IF THERE IS A MODERATE OR SEVERE REACTION? What should I look for? Any unusual condition, such as a severe allergic reaction or a high fever. If a severe allergic reaction   occurred, it would be within a few minutes to an hour after the shot. Signs of a serious allergic reaction can include difficulty breathing, weakness, hoarseness or wheezing, a fast heartbeat, hives, dizziness, paleness, or swelling of the throat. What should I do?  Call your caregiver, or get the person to a caregiver right away.  Tell the caregiver what  happened, the date and time it happened, and when the vaccination was given.  Ask the caregiver to report the reaction by filing a Vaccine Adverse Event Reporting System (VAERS) form. Or, you can file this report through the VAERS web site at www.vaers.hhs.gov or by calling 1-800-822-7967. VAERS does not provide medical advice. HOW CAN I LEARN MORE?  Ask your caregiver. He or she can give you the vaccine package insert or suggest other sources of information.  Contact the Centers for Disease Control and Prevention (CDC):  Call 1-800-232-4636 (1-800-CDC-INFO).  Visit the CDC website at www.cdc.gov/vaccines CDC Shingles Vaccine VIS (06/15/08) Document Released: 06/24/2006 Document Revised: 11/19/2011 Document Reviewed: 06/15/2008 ExitCare Patient Information 2013 [employer].  

## 2012-09-23 ENCOUNTER — Other Ambulatory Visit: Payer: Self-pay | Admitting: Family Medicine

## 2012-09-26 ENCOUNTER — Other Ambulatory Visit: Payer: Self-pay | Admitting: Family Medicine

## 2012-09-29 ENCOUNTER — Other Ambulatory Visit (INDEPENDENT_AMBULATORY_CARE_PROVIDER_SITE_OTHER): Payer: 59

## 2012-09-29 DIAGNOSIS — Z0000 Encounter for general adult medical examination without abnormal findings: Secondary | ICD-10-CM

## 2012-09-29 LAB — LIPID PANEL
Cholesterol: 167 mg/dL (ref 0–200)
LDL Cholesterol: 74 mg/dL (ref 0–99)
Total CHOL/HDL Ratio: 2
Triglycerides: 64 mg/dL (ref 0.0–149.0)

## 2012-09-29 LAB — CBC WITH DIFFERENTIAL/PLATELET
Basophils Absolute: 0.1 10*3/uL (ref 0.0–0.1)
Basophils Relative: 0.9 % (ref 0.0–3.0)
Eosinophils Absolute: 0.2 10*3/uL (ref 0.0–0.7)
HCT: 37.8 % (ref 36.0–46.0)
Hemoglobin: 12.4 g/dL (ref 12.0–15.0)
Lymphs Abs: 1.6 10*3/uL (ref 0.7–4.0)
MCHC: 32.9 g/dL (ref 30.0–36.0)
Neutro Abs: 5.1 10*3/uL (ref 1.4–7.7)
RBC: 4.27 Mil/uL (ref 3.87–5.11)
RDW: 13.9 % (ref 11.5–14.6)

## 2012-09-29 LAB — HEPATIC FUNCTION PANEL
Alkaline Phosphatase: 54 U/L (ref 39–117)
Bilirubin, Direct: 0.1 mg/dL (ref 0.0–0.3)
Total Bilirubin: 0.6 mg/dL (ref 0.3–1.2)
Total Protein: 6.9 g/dL (ref 6.0–8.3)

## 2012-09-29 LAB — BASIC METABOLIC PANEL
BUN: 11 mg/dL (ref 6–23)
CO2: 29 mEq/L (ref 19–32)
Calcium: 8.9 mg/dL (ref 8.4–10.5)
Chloride: 100 mEq/L (ref 96–112)
Creatinine, Ser: 0.7 mg/dL (ref 0.4–1.2)

## 2012-09-30 ENCOUNTER — Encounter: Payer: Self-pay | Admitting: Gynecology

## 2012-09-30 NOTE — Progress Notes (Signed)
Quick Note:  Pt has CPE on 10/07/12 will go over then. ______ 

## 2012-10-07 ENCOUNTER — Encounter: Payer: Self-pay | Admitting: Family Medicine

## 2012-10-07 ENCOUNTER — Other Ambulatory Visit: Payer: Self-pay | Admitting: Anesthesiology

## 2012-10-07 ENCOUNTER — Other Ambulatory Visit: Payer: Self-pay | Admitting: Gynecology

## 2012-10-07 ENCOUNTER — Ambulatory Visit (INDEPENDENT_AMBULATORY_CARE_PROVIDER_SITE_OTHER): Payer: 59 | Admitting: Family Medicine

## 2012-10-07 VITALS — BP 120/80 | HR 93 | Temp 97.9°F | Ht 65.5 in | Wt 150.0 lb

## 2012-10-07 DIAGNOSIS — Z0000 Encounter for general adult medical examination without abnormal findings: Secondary | ICD-10-CM

## 2012-10-07 DIAGNOSIS — Z1211 Encounter for screening for malignant neoplasm of colon: Secondary | ICD-10-CM

## 2012-10-07 MED ORDER — ATENOLOL-CHLORTHALIDONE 50-25 MG PO TABS: 1.0000 | ORAL_TABLET | Freq: Every day | ORAL | Status: DC

## 2012-10-07 MED ORDER — ZOLPIDEM TARTRATE 10 MG PO TABS: 10.0000 mg | ORAL_TABLET | Freq: Every evening | ORAL | Status: DC | PRN

## 2012-10-07 MED ORDER — MELOXICAM 15 MG PO TABS: 15.0000 mg | ORAL_TABLET | Freq: Every day | ORAL | Status: DC

## 2012-10-07 MED ORDER — ATORVASTATIN CALCIUM 40 MG PO TABS: 40.0000 mg | ORAL_TABLET | Freq: Every day | ORAL | Status: DC

## 2012-10-07 MED ORDER — POTASSIUM CHLORIDE CRYS ER 20 MEQ PO TBCR: 20.0000 meq | EXTENDED_RELEASE_TABLET | Freq: Every day | ORAL | Status: DC

## 2012-10-07 NOTE — Progress Notes (Signed)
  Subjective:    Patient ID: Melody Sutton, female    DOB: 1951/08/31, 62 y.o.   MRN: [id number]  HPI 62 yr old female for a cpx. She feels fine and has no concerns.    Review of Systems  Constitutional: Negative.   HENT: Negative.   Eyes: Negative.   Respiratory: Negative.   Cardiovascular: Negative.   Gastrointestinal: Negative.   Genitourinary: Negative for dysuria, urgency, frequency, hematuria, flank pain, decreased urine volume, enuresis, difficulty urinating, pelvic pain and dyspareunia.  Musculoskeletal: Negative.   Skin: Negative.   Neurological: Negative.   Hematological: Negative.   Psychiatric/Behavioral: Negative.        Objective:   Physical Exam  Constitutional: She is oriented to person, place, and time. She appears well-developed and well-nourished. No distress.  HENT:  Head: Normocephalic and atraumatic.  Right Ear: External ear normal.  Left Ear: External ear normal.  Nose: Nose normal.  Mouth/Throat: Oropharynx is clear and moist. No oropharyngeal exudate.  Eyes: Conjunctivae normal and EOM are normal. Pupils are equal, round, and reactive to light. No scleral icterus.  Neck: Normal range of motion. Neck supple. No JVD present. No thyromegaly present.  Cardiovascular: Normal rate, regular rhythm, normal heart sounds and intact distal pulses.  Exam reveals no gallop and no friction rub.   No murmur heard.      EKG normal   Pulmonary/Chest: Effort normal and breath sounds normal. No respiratory distress. She has no wheezes. She has no rales. She exhibits no tenderness.  Abdominal: Soft. Bowel sounds are normal. She exhibits no distension and no mass. There is no tenderness. There is no rebound and no guarding.  Musculoskeletal: Normal range of motion. She exhibits no edema and no tenderness.  Lymphadenopathy:    She has no cervical adenopathy.  Neurological: She is alert and oriented to person, place, and time. She has normal reflexes. No cranial nerve  deficit. She exhibits normal muscle tone. Coordination normal.  Skin: Skin is warm and dry. No rash noted. No erythema.  Psychiatric: She has a normal mood and affect. Her behavior is normal. Judgment and thought content normal.          Assessment & Plan:  Well exam.

## 2012-10-25 ENCOUNTER — Other Ambulatory Visit: Payer: Self-pay

## 2013-03-04 ENCOUNTER — Ambulatory Visit (INDEPENDENT_AMBULATORY_CARE_PROVIDER_SITE_OTHER): Payer: 59 | Admitting: Family Medicine

## 2013-03-04 ENCOUNTER — Encounter: Payer: Self-pay | Admitting: Family Medicine

## 2013-03-04 VITALS — BP 120/80 | HR 60 | Temp 97.8°F | Wt 148.0 lb

## 2013-03-04 DIAGNOSIS — K5904 Chronic idiopathic constipation: Secondary | ICD-10-CM

## 2013-03-04 DIAGNOSIS — K5909 Other constipation: Secondary | ICD-10-CM

## 2013-03-04 MED ORDER — POLYETHYLENE GLYCOL 3350 17 GM/SCOOP PO POWD: 17.0000 g | Freq: Two times a day (BID) | ORAL | Status: DC | PRN

## 2013-03-04 MED ORDER — DOCUSATE SODIUM 100 MG PO CAPS: 100.0000 mg | ORAL_CAPSULE | Freq: Two times a day (BID) | ORAL | Status: DC

## 2013-03-04 NOTE — Progress Notes (Signed)
  Subjective:    Patient ID: Melody Sutton, female    DOB: June 14, 1951, 62 y.o.   MRN: [id number]  HPI Here for 2 months of feeling bloated with small BMs and decreased appetite. She has passed more flatus than usual. No fever or nausea. She has tried Metamucil with no response.    Review of Systems  Constitutional: Negative.   Gastrointestinal: Positive for abdominal pain, constipation and abdominal distention. Negative for nausea, vomiting, diarrhea, blood in stool and rectal pain.       Objective:   Physical Exam  Constitutional: She appears well-developed and well-nourished. No distress.  Abdominal: Soft. Bowel sounds are normal. She exhibits no distension and no mass. There is no tenderness. There is no rebound and no guarding.          Assessment & Plan:  She seems to be constipated. Switch from metamucil to Miralax to use this bid. Add Colace 100 mg bid. She will take one bottle of magnesium citrate tonight. Drink plenty of water. Recheck prn

## 2013-07-16 ENCOUNTER — Other Ambulatory Visit: Payer: Self-pay

## 2013-07-21 ENCOUNTER — Other Ambulatory Visit: Payer: Self-pay | Admitting: Physician Assistant

## 2013-07-30 ENCOUNTER — Telehealth: Payer: Self-pay | Admitting: Family Medicine

## 2013-07-30 NOTE — Telephone Encounter (Signed)
Pt is requesting a refill of the generic Ambien. Please advise.

## 2013-07-31 MED ORDER — ZOLPIDEM TARTRATE 10 MG PO TABS: 10.0000 mg | ORAL_TABLET | Freq: Every evening | ORAL | Status: DC | PRN

## 2013-07-31 NOTE — Telephone Encounter (Signed)
Pt requested a 90 day supply and send to [facility]. I printed script and it was faxed.

## 2013-07-31 NOTE — Telephone Encounter (Signed)
Okay for 6 months 

## 2013-08-02 ENCOUNTER — Other Ambulatory Visit: Payer: Self-pay | Admitting: Family Medicine

## 2013-08-30 ENCOUNTER — Other Ambulatory Visit: Payer: Self-pay | Admitting: Family Medicine

## 2013-10-15 ENCOUNTER — Telehealth: Payer: Self-pay | Admitting: Family Medicine

## 2013-10-15 ENCOUNTER — Encounter: Payer: Self-pay | Admitting: Family Medicine

## 2013-10-15 ENCOUNTER — Ambulatory Visit (INDEPENDENT_AMBULATORY_CARE_PROVIDER_SITE_OTHER): Payer: 59 | Admitting: Family Medicine

## 2013-10-15 VITALS — BP 130/80 | Temp 97.9°F | Wt 148.0 lb

## 2013-10-15 DIAGNOSIS — J019 Acute sinusitis, unspecified: Secondary | ICD-10-CM

## 2013-10-15 NOTE — Telephone Encounter (Signed)
Pt is still waiting on zpak call [facility]

## 2013-10-15 NOTE — Progress Notes (Signed)
Pre visit review using our clinic review tool, if applicable. No additional management support is needed unless otherwise documented below in the visit note. 

## 2013-10-15 NOTE — Progress Notes (Signed)
   Subjective:    Patient ID: Melody Sutton, female    DOB: 1950-12-31, 63 y.o.   MRN: [id number]  HPI Here for 3 days of sinus pressure, PND, and coughing up green sputum.    Review of Systems  Constitutional: Negative.   HENT: Positive for congestion, postnasal drip and sinus pressure.   Eyes: Negative.   Respiratory: Positive for cough.        Objective:   Physical Exam  Constitutional: She appears well-developed and well-nourished.  HENT:  Right Ear: External ear normal.  Left Ear: External ear normal.  Nose: Nose normal.  Mouth/Throat: Oropharynx is clear and moist.  Eyes: Conjunctivae are normal.  Pulmonary/Chest: Effort normal and breath sounds normal.  Lymphadenopathy:    She has no cervical adenopathy.          Assessment & Plan:  Add Mucinex

## 2013-10-16 MED ORDER — AZITHROMYCIN 250 MG PO TABS: ORAL_TABLET | ORAL | Status: DC

## 2013-10-16 NOTE — Telephone Encounter (Signed)
I sent script e-scribe and spoke with pt. 

## 2013-10-16 NOTE — Telephone Encounter (Signed)
Please call in a Zpack  °

## 2013-10-28 ENCOUNTER — Encounter: Payer: 59 | Admitting: Family Medicine

## 2013-10-28 ENCOUNTER — Other Ambulatory Visit: Payer: 59

## 2013-11-04 ENCOUNTER — Encounter: Payer: 59 | Admitting: Family Medicine

## 2013-11-26 ENCOUNTER — Encounter: Payer: Self-pay | Admitting: Internal Medicine

## 2013-12-02 ENCOUNTER — Other Ambulatory Visit (INDEPENDENT_AMBULATORY_CARE_PROVIDER_SITE_OTHER): Payer: 59

## 2013-12-02 ENCOUNTER — Other Ambulatory Visit (HOSPITAL_COMMUNITY)
Admission: RE | Admit: 2013-12-02 | Discharge: 2013-12-02 | Disposition: A | Discharge status: Home / Self Care | Payer: 59 | Source: Ambulatory Visit | Attending: Gynecology | Admitting: Gynecology

## 2013-12-02 ENCOUNTER — Encounter: Payer: Self-pay | Admitting: Gynecology

## 2013-12-02 ENCOUNTER — Ambulatory Visit (INDEPENDENT_AMBULATORY_CARE_PROVIDER_SITE_OTHER): Payer: 59 | Admitting: Gynecology

## 2013-12-02 VITALS — BP 132/78 | Ht 65.0 in | Wt 149.8 lb

## 2013-12-02 DIAGNOSIS — Z0000 Encounter for general adult medical examination without abnormal findings: Secondary | ICD-10-CM

## 2013-12-02 DIAGNOSIS — Z79890 Hormone replacement therapy (postmenopausal): Secondary | ICD-10-CM

## 2013-12-02 DIAGNOSIS — Z01419 Encounter for gynecological examination (general) (routine) without abnormal findings: Secondary | ICD-10-CM | POA: Insufficient documentation

## 2013-12-02 DIAGNOSIS — Z87410 Personal history of cervical dysplasia: Secondary | ICD-10-CM

## 2013-12-02 LAB — CBC WITH DIFFERENTIAL/PLATELET
BASOS PCT: 0.6 % (ref 0.0–3.0)
Basophils Absolute: 0 10*3/uL (ref 0.0–0.1)
EOS PCT: 2.7 % (ref 0.0–5.0)
Eosinophils Absolute: 0.2 10*3/uL (ref 0.0–0.7)
HCT: 39.4 % (ref 36.0–46.0)
HEMOGLOBIN: 13 g/dL (ref 12.0–15.0)
LYMPHS PCT: 24.2 % (ref 12.0–46.0)
Lymphs Abs: 1.7 10*3/uL (ref 0.7–4.0)
MCHC: 33 g/dL (ref 30.0–36.0)
MCV: 91 fl (ref 78.0–100.0)
MONOS PCT: 8.6 % (ref 3.0–12.0)
Monocytes Absolute: 0.6 10*3/uL (ref 0.1–1.0)
NEUTROS ABS: 4.6 10*3/uL (ref 1.4–7.7)
Neutrophils Relative %: 63.9 % (ref 43.0–77.0)
Platelets: 231 10*3/uL (ref 150.0–400.0)
RBC: 4.33 Mil/uL (ref 3.87–5.11)
RDW: 14.2 % (ref 11.5–14.6)
WBC: 7.1 10*3/uL (ref 4.5–10.5)

## 2013-12-02 LAB — HEPATIC FUNCTION PANEL
ALBUMIN: 4.1 g/dL (ref 3.5–5.2)
ALK PHOS: 48 U/L (ref 39–117)
ALT: 21 U/L (ref 0–35)
AST: 24 U/L (ref 0–37)
Bilirubin, Direct: 0.1 mg/dL (ref 0.0–0.3)
TOTAL PROTEIN: 6.8 g/dL (ref 6.0–8.3)
Total Bilirubin: 0.7 mg/dL (ref 0.3–1.2)

## 2013-12-02 LAB — POCT URINALYSIS DIPSTICK
Bilirubin, UA: NEGATIVE
GLUCOSE UA: NEGATIVE
KETONES UA: NEGATIVE
Leukocytes, UA: NEGATIVE
Nitrite, UA: NEGATIVE
SPEC GRAV UA: 1.015
UROBILINOGEN UA: 0.2
pH, UA: 8.5

## 2013-12-02 LAB — LIPID PANEL
CHOL/HDL RATIO: 2
Cholesterol: 179 mg/dL (ref 0–200)
HDL: 92.4 mg/dL (ref >39.00)
LDL CALC: 76 mg/dL (ref 0–99)
TRIGLYCERIDES: 53 mg/dL (ref 0.0–149.0)
VLDL: 10.6 mg/dL (ref 0.0–40.0)

## 2013-12-02 LAB — TSH: TSH: 1.37 u[IU]/mL (ref 0.35–5.50)

## 2013-12-02 LAB — BASIC METABOLIC PANEL
BUN: 15 mg/dL (ref 6–23)
CALCIUM: 9.6 mg/dL (ref 8.4–10.5)
CO2: 29 meq/L (ref 19–32)
Chloride: 102 mEq/L (ref 96–112)
Creatinine, Ser: 0.6 mg/dL (ref 0.4–1.2)
GFR: 103.15 mL/min (ref >60.00)
Glucose, Bld: 97 mg/dL (ref 70–99)
Potassium: 4.3 mEq/L (ref 3.5–5.1)
SODIUM: 140 meq/L (ref 135–145)

## 2013-12-02 MED ORDER — NONFORMULARY OR COMPOUNDED ITEM: Status: DC

## 2013-12-02 MED ORDER — VIVELLE-DOT 0.05 MG/24HR TD PTTW: 1.0000 | MEDICATED_PATCH | TRANSDERMAL | Status: DC

## 2013-12-02 NOTE — Progress Notes (Signed)
Melody Sutton 1951-09-02 [id number]   History:    63 y.o.  who presented to the office for her annual gynecological exam.Patient with past history in 1980 of CIN III and had TVH. Follow up pap smears have been normal. Patient has been doing well on twice a week Vivelle Dot (0.05 mg). Review of records indicated that her last colonoscopy was normal in 2005. History of benign ovarian cystectomy in [place] in 1975. Dr. Sarajane Jews is her primary physician who will be doing her labs next week. Normal bone density in 2013. Tdap in 2011.   Patient has informed me that she has been on estrogen low-dose patch for 10 years and has attempted in the past to taper off and we will try again this year. Her colonoscopy was 10 years ago and she is overdue.   Past medical history,surgical history, family history and social history were all reviewed and documented in the EPIC chart.  Gynecologic History Patient's last menstrual period was 09/10/1978. Contraception: status post hysterectomy Last Pap: 2014. Results were: normal Last mammogram: 2015. Results were: normal  Obstetric History OB History  Gravida Para Term Preterm AB SAB TAB Ectopic Multiple Living  1    1         # Outcome Date GA Lbr Len/2nd Weight Sex Delivery Anes PTL Lv  1 ABT                ROS: A ROS was performed and pertinent positives and negatives are included in the history.  GENERAL: No fevers or chills. HEENT: No change in vision, no earache, sore throat or sinus congestion. NECK: No pain or stiffness. CARDIOVASCULAR: No chest pain or pressure. No palpitations. PULMONARY: No shortness of breath, cough or wheeze. GASTROINTESTINAL: No abdominal pain, nausea, vomiting or diarrhea, melena or bright red blood per rectum. GENITOURINARY: No urinary frequency, urgency, hesitancy or dysuria. MUSCULOSKELETAL: No joint or muscle pain, no back pain, no recent trauma. DERMATOLOGIC: No rash, no itching, no lesions. ENDOCRINE: No polyuria,  polydipsia, no heat or cold intolerance. No recent change in weight. HEMATOLOGICAL: No anemia or easy bruising or bleeding. NEUROLOGIC: No headache, seizures, numbness, tingling or weakness. PSYCHIATRIC: No depression, no loss of interest in normal activity or change in sleep pattern.     Exam: chaperone present  BP 132/78  Ht 5\' 5"  (1.651 m)  Wt 149 lb 12.8 oz (67.949 kg)  BMI 24.93 kg/m2  LMP 09/10/1978  Body mass index is 24.93 kg/(m^2).  General appearance : Well developed well nourished female. No acute distress HEENT: Neck supple, trachea midline, no carotid bruits, no thyroidmegaly Lungs: Clear to auscultation, no rhonchi or wheezes, or rib retractions  Heart: Regular rate and rhythm, no murmurs or gallops Breast:Examined in sitting and supine position were symmetrical in appearance, no palpable masses or tenderness,  no skin retraction, no nipple inversion, no nipple discharge, no skin discoloration, no axillary or supraclavicular lymphadenopathy Abdomen: no palpable masses or tenderness, no rebound or guarding Extremities: no edema or skin discoloration or tenderness  Pelvic:  Bartholin, Urethra, Skene Glands: Within normal limits             Vagina: No gross lesions or discharge  Cervix: Absent  Uterus  Absent  Adnexa  Without masses or tenderness  Anus and perineum  normal   Rectovaginal  normal sphincter tone without palpated masses or tenderness             Hemoccult PCP we'll provide  Assessment/Plan:  63 y.o. female for annual exam who has been on transdermal estrogen for 10 years. We discussed the women's health initiative studies and the recommendation to taper off to minimize any potential risk for breast cancer. She will begin transitioning off the patch over the next couple weeks. I have tentatively given her a prescription for estradiol vaginal cream 0.02% to apply twice a week when she comes off the transdermal estrogen. Because of her history of CIN 3 in  the past we will continue to do Pap smears every year as we did today. She will check with her PCP but scheduling her overdue colonoscopy and he will be drawn her blood work. All her medications are up to date. She is also due for bone density study as well. We discussed importance of calcium and vitamin D in regular exercise for osteoporosis prevention.   This dictation was prepared with  Dragon/digital dictation along withSmart phrase technology. Any transcriptional errors that result from this process are unintentional.   Terrance Mass MD, 12:31 PM 12/02/2013

## 2013-12-02 NOTE — Patient Instructions (Signed)
Shingles Vaccine  What You Need to Know  WHAT IS SHINGLES?  · Shingles is a painful skin rash, often with blisters. It is also called Herpes Zoster or just Zoster.  · A shingles rash usually appears on one side of the face or body and lasts from 2 to 4 weeks. Its main symptom is pain, which can be quite severe. Other symptoms of shingles can include fever, headache, chills, and upset stomach. Very rarely, a shingles infection can lead to pneumonia, hearing problems, blindness, brain inflammation (encephalitis), or death.  · For about 1 person in 5, severe pain can continue even after the rash clears up. This is called post-herpetic neuralgia.  · Shingles is caused by the Varicella Zoster virus. This is the same virus that causes chickenpox. Only someone who has had a case of chickenpox or rarely, has gotten chickenpox vaccine, can get shingles. The virus stays in your body. It can reappear many years later to cause a case of shingles.  · You cannot catch shingles from another person with shingles. However, a person who has never had chickenpox (or chickenpox vaccine) could get chickenpox from someone with shingles. This is not very common.  · Shingles is far more common in people 50 and older than in younger people. It is also more common in people whose immune systems are weakened because of a disease such as cancer or drugs such as steroids or chemotherapy.  · At least 1 million people get shingles per year in the [place].  SHINGLES VACCINE  · A vaccine for shingles was licensed in 2006. In clinical trials, the vaccine reduced the risk of shingles by 50%. It can also reduce the pain in people who still get shingles after being vaccinated.  · A single dose of shingles vaccine is recommended for adults 60 years of age and older.  SOME PEOPLE SHOULD NOT GET SHINGLES VACCINE OR SHOULD WAIT  A person should not get shingles vaccine if he or she:  · Has ever had a life-threatening allergic reaction to gelatin, the  antibiotic neomycin, or any other component of shingles vaccine. Tell your caregiver if you have any severe allergies.  · Has a weakened immune system because of current:  · AIDS or another disease that affects the immune system.  · Treatment with drugs that affect the immune system, such as prolonged use of high-dose steroids.  · Cancer treatment, such as radiation or chemotherapy.  · Cancer affecting the bone marrow or lymphatic system, such as leukemia or lymphoma.  · Is pregnant, or might be pregnant. Women should not become pregnant until at least 4 weeks after getting shingles vaccine.  Someone with a minor illness, such as a cold, may be vaccinated. Anyone with a moderate or severe acute illness should usually wait until he or she recovers before getting the vaccine. This includes anyone with a temperature of 101.3° F (38° C) or higher.  WHAT ARE THE RISKS FROM SHINGLES VACCINE?  · A vaccine, like any medicine, could possibly cause serious problems, such as severe allergic reactions. However, the risk of a vaccine causing serious harm, or death, is extremely small.  · No serious problems have been identified with shingles vaccine.  Mild Problems  · Redness, soreness, swelling, or itching at the site of the injection (about 1 person in 3).  · Headache (about 1 person in 70).  Like all vaccines, shingles vaccine is being closely monitored for unusual or severe problems.  WHAT IF   THERE IS A MODERATE OR SEVERE REACTION?  What should I look for?  Any unusual condition, such as a severe allergic reaction or a high fever. If a severe allergic reaction occurred, it would be within a few minutes to an hour after the shot. Signs of a serious allergic reaction can include difficulty breathing, weakness, hoarseness or wheezing, a fast heartbeat, hives, dizziness, paleness, or swelling of the throat.  What should I do?  · Call your caregiver, or get the person to a caregiver right away.  · Tell the caregiver what  happened, the date and time it happened, and when the vaccination was given.  · Ask the caregiver to report the reaction by filing a Vaccine Adverse Event Reporting System (VAERS) form. Or, you can file this report through the VAERS web site at www.vaers.hhs.gov or by calling 1-800-822-7967.  VAERS does not provide medical advice.  HOW CAN I LEARN MORE?  · Ask your caregiver. He or she can give you the vaccine package insert or suggest other sources of information.  · Contact the Centers for Disease Control and Prevention (CDC):  · Call 1-800-232-4636 (1-800-CDC-INFO).  · Visit the CDC website at www.cdc.gov/vaccines  CDC Shingles Vaccine VIS (06/15/08)  Document Released: 06/24/2006 Document Revised: 11/19/2011 Document Reviewed: 12/17/2012  Exit[employer]® Patient Information ©2014 [employer].

## 2013-12-08 ENCOUNTER — Ambulatory Visit (INDEPENDENT_AMBULATORY_CARE_PROVIDER_SITE_OTHER): Payer: 59 | Admitting: Family Medicine

## 2013-12-08 ENCOUNTER — Encounter: Payer: Self-pay | Admitting: Family Medicine

## 2013-12-08 VITALS — BP 126/80 | HR 79 | Temp 98.2°F | Ht 65.0 in | Wt 150.0 lb

## 2013-12-08 DIAGNOSIS — Z0000 Encounter for general adult medical examination without abnormal findings: Secondary | ICD-10-CM

## 2013-12-08 MED ORDER — POTASSIUM CHLORIDE CRYS ER 20 MEQ PO TBCR: 20.0000 meq | EXTENDED_RELEASE_TABLET | Freq: Every day | ORAL | Status: DC

## 2013-12-08 MED ORDER — TEMAZEPAM 30 MG PO CAPS: 30.0000 mg | ORAL_CAPSULE | Freq: Every evening | ORAL | Status: DC | PRN

## 2013-12-08 MED ORDER — ATORVASTATIN CALCIUM 40 MG PO TABS: 40.0000 mg | ORAL_TABLET | Freq: Every day | ORAL | Status: DC

## 2013-12-08 MED ORDER — MELOXICAM 15 MG PO TABS: ORAL_TABLET | ORAL | Status: DC

## 2013-12-08 MED ORDER — ATENOLOL-CHLORTHALIDONE 50-25 MG PO TABS: 1.0000 | ORAL_TABLET | Freq: Every day | ORAL | Status: DC

## 2013-12-08 NOTE — Progress Notes (Signed)
Pre visit review using our clinic review tool, if applicable. No additional management support is needed unless otherwise documented below in the visit note. 

## 2013-12-08 NOTE — Progress Notes (Signed)
   Subjective:    Patient ID: Melody Sutton, female    DOB: [birth year], 63 y.o.   MRN: [id number]  HPI 63 yr old female for a cpx. She feels well. She has been on Ambien for some years and it does not works as well as it used to. She would like to try something else for sleep.    Review of Systems  Constitutional: Negative.   HENT: Negative.   Eyes: Negative.   Respiratory: Negative.   Cardiovascular: Negative.   Gastrointestinal: Negative.   Genitourinary: Negative for dysuria, urgency, frequency, hematuria, flank pain, decreased urine volume, enuresis, difficulty urinating, pelvic pain and dyspareunia.  Musculoskeletal: Negative.   Skin: Negative.   Neurological: Negative.   Psychiatric/Behavioral: Negative.        Objective:   Physical Exam  Constitutional: She is oriented to person, place, and time. She appears well-developed and well-nourished. No distress.  HENT:  Head: Normocephalic and atraumatic.  Right Ear: External ear normal.  Left Ear: External ear normal.  Nose: Nose normal.  Mouth/Throat: Oropharynx is clear and moist. No oropharyngeal exudate.  Eyes: Conjunctivae and EOM are normal. Pupils are equal, round, and reactive to light. No scleral icterus.  Neck: Normal range of motion. Neck supple. No JVD present. No thyromegaly present.  Cardiovascular: Normal rate, regular rhythm, normal heart sounds and intact distal pulses.  Exam reveals no gallop and no friction rub.   No murmur heard. EKG normal   Pulmonary/Chest: Effort normal and breath sounds normal. No respiratory distress. She has no wheezes. She has no rales. She exhibits no tenderness.  Abdominal: Soft. Bowel sounds are normal. She exhibits no distension and no mass. There is no tenderness. There is no rebound and no guarding.  Musculoskeletal: Normal range of motion. She exhibits no edema and no tenderness.  Lymphadenopathy:    She has no cervical adenopathy.  Neurological: She is alert and oriented  to person, place, and time. She has normal reflexes. No cranial nerve deficit. She exhibits normal muscle tone. Coordination normal.  Skin: Skin is warm and dry. No rash noted. No erythema.  Psychiatric: She has a normal mood and affect. Her behavior is normal. Judgment and thought content normal.          Assessment & Plan:  Well exam. Try Temazepam qhs.

## 2013-12-21 ENCOUNTER — Encounter: Payer: Self-pay | Admitting: Internal Medicine

## 2013-12-24 ENCOUNTER — Encounter: Payer: Self-pay | Admitting: Family Medicine

## 2014-02-03 ENCOUNTER — Ambulatory Visit (AMBULATORY_SURGERY_CENTER): Payer: Self-pay

## 2014-02-03 VITALS — Ht 65.5 in | Wt 152.6 lb

## 2014-02-03 DIAGNOSIS — Z1211 Encounter for screening for malignant neoplasm of colon: Secondary | ICD-10-CM

## 2014-02-03 MED ORDER — MOVIPREP 100 G PO SOLR: 1.0000 | Freq: Once | ORAL | Status: DC

## 2014-02-03 NOTE — Progress Notes (Signed)
No allergies to eggs or soy No home oxygen No past problems with anesthesia No diet/weight loss meds  Has email.  Emmi instructions given for colonoscopy. 

## 2014-02-04 ENCOUNTER — Telehealth: Payer: Self-pay | Admitting: Family Medicine

## 2014-02-04 NOTE — Telephone Encounter (Signed)
Pt wants to know can she go back to [place]?  She is currently having horrible nightmares with the current rx she is taking.  Please advise.

## 2014-02-05 MED ORDER — ZOLPIDEM TARTRATE 10 MG PO TABS: 10.0000 mg | ORAL_TABLET | Freq: Every evening | ORAL | Status: DC | PRN

## 2014-02-05 NOTE — Telephone Encounter (Signed)
Cancel Temazepam and call in Zolpidem 10 mg qhs #30 with 5 rf

## 2014-02-05 NOTE — Telephone Encounter (Signed)
I called in new script, cancelled refills on Temazepam and left a voice message for pt with this information.

## 2014-02-17 ENCOUNTER — Ambulatory Visit (AMBULATORY_SURGERY_CENTER): Payer: 59 | Admitting: Internal Medicine

## 2014-02-17 ENCOUNTER — Encounter: Payer: Self-pay | Admitting: Internal Medicine

## 2014-02-17 VITALS — BP 150/75 | HR 59 | Temp 96.9°F | Resp 15 | Ht 65.5 in | Wt 152.0 lb

## 2014-02-17 DIAGNOSIS — D126 Benign neoplasm of colon, unspecified: Secondary | ICD-10-CM

## 2014-02-17 DIAGNOSIS — Z1211 Encounter for screening for malignant neoplasm of colon: Secondary | ICD-10-CM

## 2014-02-17 HISTORY — PX: COLONOSCOPY: SHX174

## 2014-02-17 MED ORDER — SODIUM CHLORIDE 0.9 % IV SOLN: 500.0000 mL | INTRAVENOUS | Status: DC

## 2014-02-17 NOTE — Op Note (Signed)
[place]  [address]. [place], [id number]   COLONOSCOPY PROCEDURE REPORT  PATIENT: Melody Sutton, Melody Sutton  MR#: [id number] BIRTHDATE: 07/21/1951 , 62  yrs. old GENDER: Female ENDOSCOPIST: Lafayette Dragon, MD REFERRED DS:[id number] Sarajane Jews, M.D. PROCEDURE DATE:  02/17/2014 PROCEDURE:   Colonoscopy with snare polypectomy and Colonoscopy with cold biopsy polypectomy First Screening Colonoscopy - Avg.  risk and is 50 yrs.  old or older - No.  Prior Negative Screening - Now for repeat screening. 10 or more years since last screening  History of Adenoma - Now for follow-up colonoscopy & has been > or = to 3 yrs.  N/A  Polyps Removed Today? Yes. ASA CLASS:   Class II INDICATIONS:Average risk patient for colon cancer and loss colonoscopy in May 2005 was a normal exam. MEDICATIONS: MAC sedation, administered by CRNA and Propofol (Diprivan) 270 mg IV  DESCRIPTION OF PROCEDURE:   After the risks benefits and alternatives of the procedure were thoroughly explained, informed consent was obtained.  A digital rectal exam revealed no abnormalities of the rectum.   The LB [id number]-[id number]  endoscope was introduced through the anus and advanced to the cecum, which was identified by both the appendix and ileocecal valve. No adverse events experienced.   The quality of the prep was good, using MoviPrep  The instrument was then slowly withdrawn as the colon was fully examined.      COLON FINDINGS: Two sessile polyps ranging between 3-19mm in size were found at the cecum and in the sigmoid colon.  A polypectomy was performed using snare cautery and with cold forceps.  The resection was complete and the polyp tissue was completely retrieved.   Mild diverticulosis was noted in the sigmoid colon. Retroflexed views revealed no abnormalities. The time to cecum=4 minutes 48 seconds.  Withdrawal time=8 minutes 47 seconds.  The scope was withdrawn and the procedure completed. COMPLICATIONS:  There were no complications.  ENDOSCOPIC IMPRESSION: 1.   Two sessile polyps ranging between 3-67mm in size were found at the cecum and in the sigmoid colon at 20 cm; polypectomy was performed using snare cautery ( cecal polyp) and with cold forceps 2.   Mild diverticulosis was noted in the sigmoid colon  RECOMMENDATIONS: 1.  Await pathology results 2.  high-fiber diet Recall colonoscopy pending path report   eSigned:  Lafayette Dragon, MD 02/17/2014 9:10 AM   cc:   PATIENT NAME:  Melody Sutton, Melody Sutton MR#: [id number]

## 2014-02-17 NOTE — Progress Notes (Signed)
Report to PACU, RN, vss, BBS= Clear.  

## 2014-02-17 NOTE — Patient Instructions (Signed)
YOU HAD AN ENDOSCOPIC PROCEDURE TODAY AT THE [facility]: Refer to the procedure report that was given to you for any specific questions about what was found during the examination.  If the procedure report does not answer your questions, please call your gastroenterologist to clarify.  If you requested that your care partner not be given the details of your procedure findings, then the procedure report has been included in a sealed envelope for you to review at your convenience later.  YOU SHOULD EXPECT: Some feelings of bloating in the abdomen. Passage of more gas than usual.  Walking can help get rid of the air that was put into your GI tract during the procedure and reduce the bloating. If you had a lower endoscopy (such as a colonoscopy or flexible sigmoidoscopy) you may notice spotting of blood in your stool or on the toilet paper. If you underwent a bowel prep for your procedure, then you may not have a normal bowel movement for a few days.  DIET: Your first meal following the procedure should be a light meal and then it is ok to progress to your normal diet.  A half-sandwich or bowl of soup is an example of a good first meal.  Heavy or fried foods are harder to digest and may make you feel nauseous or bloated.  Likewise meals heavy in dairy and vegetables can cause extra gas to form and this can also increase the bloating.  Drink plenty of fluids but you should avoid alcoholic beverages for 24 hours.  ACTIVITY: Your care partner should take you home directly after the procedure.  You should plan to take it easy, moving slowly for the rest of the day.  You can resume normal activity the day after the procedure however you should NOT DRIVE or use heavy machinery for 24 hours (because of the sedation medicines used during the test).    SYMPTOMS TO REPORT IMMEDIATELY: A gastroenterologist can be reached at any hour.  During normal business hours, 8:30 AM to 5:00 PM Monday through Friday,  call [phone].  After hours and on weekends, please call the GI answering service at [phone] who will take a message and have the physician on call contact you.   Following lower endoscopy (colonoscopy or flexible sigmoidoscopy):  Excessive amounts of blood in the stool  Significant tenderness or worsening of abdominal pains  Swelling of the abdomen that is new, acute  Fever of 100F or higher    FOLLOW UP: If any biopsies were taken you will be contacted by phone or by letter within the next 1-3 weeks.  Call your gastroenterologist if you have not heard about the biopsies in 3 weeks.  Our staff will call the home number listed on your records the next business day following your procedure to check on you and address any questions or concerns that you may have at that time regarding the information given to you following your procedure. This is a courtesy call and so if there is no answer at the home number and we have not heard from you through the emergency physician on call, we will assume that you have returned to your regular daily activities without incident.  SIGNATURES/CONFIDENTIALITY: You and/or your care partner have signed paperwork which will be entered into your electronic medical record.  These signatures attest to the fact that that the information above on your After Visit Summary has been reviewed and is understood.  Full responsibility of the confidentiality   of this discharge information lies with you and/or your care-partner.   Information on polyps ,diverticulosis ,& high fiber diet given to you today 

## 2014-02-17 NOTE — Progress Notes (Signed)
Called to room to assist during endoscopic procedure.  Patient ID and intended procedure confirmed with present staff. Received instructions for my participation in the procedure from the performing physician.  

## 2014-02-18 ENCOUNTER — Telehealth: Payer: Self-pay

## 2014-02-18 NOTE — Telephone Encounter (Signed)
  Follow up Call-  Call back number 02/17/2014  Post procedure Call Back phone  # [phone]  Permission to leave phone message Yes     Patient questions:  Do you have a fever, pain , or abdominal swelling? no Pain Score  0 *  Have you tolerated food without any problems? yes  Have you been able to return to your normal activities? yes  Do you have any questions about your discharge instructions: Diet   no Medications  no Follow up visit  no  Do you have questions or concerns about your Care? no  Actions: * If pain score is 4 or above: No action needed, pain <4.

## 2014-02-22 ENCOUNTER — Encounter: Payer: Self-pay | Admitting: Internal Medicine

## 2014-02-24 ENCOUNTER — Encounter: Payer: Self-pay | Admitting: *Deleted

## 2014-06-12 ENCOUNTER — Other Ambulatory Visit: Payer: Self-pay | Admitting: Family Medicine

## 2014-07-12 ENCOUNTER — Encounter: Payer: Self-pay | Admitting: Internal Medicine

## 2014-08-11 ENCOUNTER — Other Ambulatory Visit: Payer: Self-pay | Admitting: Family Medicine

## 2014-08-11 NOTE — Telephone Encounter (Signed)
Refill for 6 months. 

## 2014-08-22 ENCOUNTER — Other Ambulatory Visit: Payer: Self-pay | Admitting: Family Medicine

## 2014-08-24 NOTE — Telephone Encounter (Signed)
Call in #30 with 5 rf 

## 2014-09-05 ENCOUNTER — Other Ambulatory Visit: Payer: Self-pay | Admitting: Family Medicine

## 2014-10-30 ENCOUNTER — Other Ambulatory Visit: Payer: Self-pay | Admitting: Family Medicine

## 2014-12-10 ENCOUNTER — Encounter: Payer: Self-pay | Admitting: Gynecology

## 2014-12-20 ENCOUNTER — Other Ambulatory Visit: Payer: Self-pay

## 2014-12-20 ENCOUNTER — Telehealth: Payer: Self-pay

## 2014-12-20 MED ORDER — VIVELLE-DOT 0.05 MG/24HR TD PTTW: 1.0000 | MEDICATED_PATCH | TRANSDERMAL | Status: DC

## 2014-12-20 NOTE — Telephone Encounter (Signed)
[facility] refill request for atorvastatin (LIPITOR) 40 MG tablet, potassium chloride SA (KLOR-CON M20) 20 MEQ tablet, atenolol-chlorthalidone (TENORETIC) 50-25 MG per tablet and meloxicam (MOBIC) 15 MG tablet

## 2014-12-21 MED ORDER — MELOXICAM 15 MG PO TABS: ORAL_TABLET | ORAL | Status: DC

## 2014-12-21 MED ORDER — ATORVASTATIN CALCIUM 40 MG PO TABS: 40.0000 mg | ORAL_TABLET | Freq: Every day | ORAL | Status: DC

## 2014-12-21 MED ORDER — ATENOLOL-CHLORTHALIDONE 50-25 MG PO TABS
1.0000 | ORAL_TABLET | Freq: Every day | ORAL | Status: DC
Start: 2014-12-21 — End: 2016-03-02

## 2014-12-21 MED ORDER — POTASSIUM CHLORIDE CRYS ER 20 MEQ PO TBCR: 20.0000 meq | EXTENDED_RELEASE_TABLET | Freq: Every day | ORAL | Status: DC

## 2014-12-21 NOTE — Telephone Encounter (Signed)
rx sent in electronically 

## 2014-12-22 ENCOUNTER — Encounter: Payer: Self-pay | Admitting: Family Medicine

## 2014-12-22 ENCOUNTER — Ambulatory Visit (INDEPENDENT_AMBULATORY_CARE_PROVIDER_SITE_OTHER): Payer: 59 | Admitting: Family Medicine

## 2014-12-22 VITALS — BP 131/75 | HR 63 | Temp 99.3°F | Ht 65.5 in | Wt 155.0 lb

## 2014-12-22 DIAGNOSIS — Z0000 Encounter for general adult medical examination without abnormal findings: Secondary | ICD-10-CM

## 2014-12-22 MED ORDER — CALCIUM CARBONATE ANTACID 500 MG PO CHEW: 1.0000 | CHEWABLE_TABLET | Freq: Two times a day (BID) | ORAL | Status: DC

## 2014-12-22 MED ORDER — ZOLPIDEM TARTRATE 10 MG PO TABS: ORAL_TABLET | ORAL | Status: DC

## 2014-12-22 MED ORDER — VITAMIN E 1000 UNITS PO CAPS: 1000.0000 [IU] | ORAL_CAPSULE | Freq: Every day | ORAL | Status: DC

## 2014-12-22 MED ORDER — VITAMIN D 1000 UNITS PO TABS: 1000.0000 [IU] | ORAL_TABLET | Freq: Every day | ORAL | Status: DC

## 2014-12-22 MED ORDER — TERBINAFINE HCL 250 MG PO TABS: 250.0000 mg | ORAL_TABLET | Freq: Every day | ORAL | Status: DC

## 2014-12-22 NOTE — Progress Notes (Signed)
Pre visit review using our clinic review tool, if applicable. No additional management support is needed unless otherwise documented below in the visit note. 

## 2014-12-22 NOTE — Progress Notes (Signed)
   Subjective:    Patient ID: Melody Sutton, female    DOB: 1951/09/06, 64 y.o.   MRN: [id number]  HPI 64 yr old female for a cpx. She feels well. She does ask about a discoloration on several toenails. She is keeping up with mammograms and colonoscopies.    Review of Systems  Constitutional: Negative.   HENT: Negative.   Eyes: Negative.   Respiratory: Negative.   Cardiovascular: Negative.   Gastrointestinal: Negative.   Genitourinary: Negative for dysuria, urgency, frequency, hematuria, flank pain, decreased urine volume, enuresis, difficulty urinating, pelvic pain and dyspareunia.  Musculoskeletal: Negative.   Skin: Negative.   Neurological: Negative.   Psychiatric/Behavioral: Negative.        Objective:   Physical Exam  Constitutional: She is oriented to person, place, and time. She appears well-developed and well-nourished. No distress.  HENT:  Head: Normocephalic and atraumatic.  Right Ear: External ear normal.  Left Ear: External ear normal.  Nose: Nose normal.  Mouth/Throat: Oropharynx is clear and moist. No oropharyngeal exudate.  Eyes: Conjunctivae and EOM are normal. Pupils are equal, round, and reactive to light. No scleral icterus.  Neck: Normal range of motion. Neck supple. No JVD present. No thyromegaly present.  Cardiovascular: Normal rate, regular rhythm, normal heart sounds and intact distal pulses.  Exam reveals no gallop and no friction rub.   No murmur heard. EKG normal   Pulmonary/Chest: Effort normal and breath sounds normal. No respiratory distress. She has no wheezes. She has no rales. She exhibits no tenderness.  Abdominal: Soft. Bowel sounds are normal. She exhibits no distension and no mass. There is no tenderness. There is no rebound and no guarding.  Musculoskeletal: Normal range of motion. She exhibits no edema or tenderness.  Lymphadenopathy:    She has no cervical adenopathy.  Neurological: She is alert and oriented to person, place, and  time. She has normal reflexes. No cranial nerve deficit. She exhibits normal muscle tone. Coordination normal.  Skin: Skin is warm and dry. No rash noted. No erythema.  Several toenails are thickened and dark   Psychiatric: She has a normal mood and affect. Her behavior is normal. Judgment and thought content normal.          Assessment & Plan:  Well exam. Treat the toenails with Terbinafine.

## 2015-01-04 ENCOUNTER — Telehealth: Payer: Self-pay | Admitting: Family Medicine

## 2015-01-04 NOTE — Telephone Encounter (Signed)
PA for Lamisil was denied. Patient's plan requires patient to have diagnosis of diabetes or if the patient is not immunocompromised.

## 2015-01-05 NOTE — Telephone Encounter (Signed)
Noted. Tell her she can pay cash for it

## 2015-01-06 NOTE — Telephone Encounter (Signed)
I left a voice message with below information. 

## 2015-01-07 ENCOUNTER — Other Ambulatory Visit (INDEPENDENT_AMBULATORY_CARE_PROVIDER_SITE_OTHER): Payer: 59

## 2015-01-07 DIAGNOSIS — Z0000 Encounter for general adult medical examination without abnormal findings: Secondary | ICD-10-CM

## 2015-01-07 LAB — CBC WITH DIFFERENTIAL/PLATELET
BASOS ABS: 0.1 10*3/uL (ref 0.0–0.1)
Basophils Relative: 0.8 % (ref 0.0–3.0)
Eosinophils Absolute: 0.2 10*3/uL (ref 0.0–0.7)
Eosinophils Relative: 3 % (ref 0.0–5.0)
HCT: 35.9 % — ABNORMAL LOW (ref 36.0–46.0)
HEMOGLOBIN: 12.3 g/dL (ref 12.0–15.0)
LYMPHS PCT: 20.3 % (ref 12.0–46.0)
Lymphs Abs: 1.6 10*3/uL (ref 0.7–4.0)
MCHC: 34.2 g/dL (ref 30.0–36.0)
MCV: 87.1 fl (ref 78.0–100.0)
Monocytes Absolute: 0.6 10*3/uL (ref 0.1–1.0)
Monocytes Relative: 7.1 % (ref 3.0–12.0)
Neutro Abs: 5.6 10*3/uL (ref 1.4–7.7)
Neutrophils Relative %: 68.8 % (ref 43.0–77.0)
Platelets: 247 10*3/uL (ref 150.0–400.0)
RBC: 4.12 Mil/uL (ref 3.87–5.11)
RDW: 13.6 % (ref 11.5–15.5)
WBC: 8.1 10*3/uL (ref 4.0–10.5)

## 2015-01-07 LAB — BASIC METABOLIC PANEL
BUN: 11 mg/dL (ref 6–23)
CHLORIDE: 102 meq/L (ref 96–112)
CO2: 31 meq/L (ref 19–32)
Calcium: 9.3 mg/dL (ref 8.4–10.5)
Creatinine, Ser: 0.63 mg/dL (ref 0.40–1.20)
GFR: 100.91 mL/min (ref >60.00)
Glucose, Bld: 102 mg/dL — ABNORMAL HIGH (ref 70–99)
POTASSIUM: 3.7 meq/L (ref 3.5–5.1)
Sodium: 139 mEq/L (ref 135–145)

## 2015-01-07 LAB — LIPID PANEL
CHOLESTEROL: 157 mg/dL (ref 0–200)
HDL: 75.1 mg/dL (ref >39.00)
LDL Cholesterol: 67 mg/dL (ref 0–99)
NONHDL: 81.9
Total CHOL/HDL Ratio: 2
Triglycerides: 76 mg/dL (ref 0.0–149.0)
VLDL: 15.2 mg/dL (ref 0.0–40.0)

## 2015-01-07 LAB — POCT URINALYSIS DIPSTICK
Bilirubin, UA: NEGATIVE
Glucose, UA: NEGATIVE
Ketones, UA: NEGATIVE
Leukocytes, UA: NEGATIVE
Nitrite, UA: NEGATIVE
Protein, UA: NEGATIVE
Spec Grav, UA: 1.015
Urobilinogen, UA: 0.2
pH, UA: 8.5

## 2015-01-07 LAB — HEPATIC FUNCTION PANEL
ALK PHOS: 57 U/L (ref 39–117)
ALT: 22 U/L (ref 0–35)
AST: 24 U/L (ref 0–37)
Albumin: 3.9 g/dL (ref 3.5–5.2)
Bilirubin, Direct: 0.1 mg/dL (ref 0.0–0.3)
Total Bilirubin: 0.6 mg/dL (ref 0.2–1.2)
Total Protein: 6.4 g/dL (ref 6.0–8.3)

## 2015-01-07 LAB — TSH: TSH: 0.96 u[IU]/mL (ref 0.35–4.50)

## 2015-01-09 ENCOUNTER — Other Ambulatory Visit: Payer: Self-pay | Admitting: Family Medicine

## 2015-01-18 ENCOUNTER — Encounter: Payer: Self-pay | Admitting: Gynecology

## 2015-01-24 ENCOUNTER — Ambulatory Visit (INDEPENDENT_AMBULATORY_CARE_PROVIDER_SITE_OTHER): Payer: 59 | Admitting: Gynecology

## 2015-01-24 ENCOUNTER — Other Ambulatory Visit (HOSPITAL_COMMUNITY)
Admission: RE | Admit: 2015-01-24 | Discharge: 2015-01-24 | Disposition: A | Discharge status: Home / Self Care | Payer: 59 | Source: Ambulatory Visit | Attending: Gynecology | Admitting: Gynecology

## 2015-01-24 ENCOUNTER — Encounter: Payer: Self-pay | Admitting: Gynecology

## 2015-01-24 VITALS — BP 160/92 | Ht 65.25 in | Wt 155.0 lb

## 2015-01-24 DIAGNOSIS — Z79890 Hormone replacement therapy (postmenopausal): Secondary | ICD-10-CM | POA: Diagnosis not present

## 2015-01-24 DIAGNOSIS — Z780 Asymptomatic menopausal state: Secondary | ICD-10-CM

## 2015-01-24 DIAGNOSIS — Z01419 Encounter for gynecological examination (general) (routine) without abnormal findings: Secondary | ICD-10-CM | POA: Diagnosis not present

## 2015-01-24 DIAGNOSIS — Z87410 Personal history of cervical dysplasia: Secondary | ICD-10-CM

## 2015-01-24 DIAGNOSIS — I10 Essential (primary) hypertension: Secondary | ICD-10-CM | POA: Diagnosis not present

## 2015-01-24 NOTE — Progress Notes (Signed)
Melody Sutton 02-Aug-1951 [id number]   History:    64 y.o.  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 Follow up pap smears have been normal. Patient has been doing well on twice a week Vivelle Dot (0.05 mg). Review of records indicated that her last colonoscopy was normal in 2005. History of benign ovarian cystectomy in [place] in 1975. Dr. Sarajane Jews is her primary physician who will be doing her labs next week. Normal bone density in 2013. Tdap in 2011. Her colonoscopy was normal in 2015.  Patient has informed me that she has been on estrogen low-dose patch for 10 years and has attempted in the past to taper off and we will try again this year.  Past medical history,surgical history, family history and social history were all reviewed and documented in the EPIC chart.  Gynecologic History Patient's last menstrual period was 09/10/1978. Contraception: status post hysterectomy Last Pap: 2015. Results were: normal Last mammogram: 2016. Results were: normal  Obstetric History OB History  Gravida Para Term Preterm AB SAB TAB Ectopic Multiple Living  1    1         # Outcome Date GA Lbr Len/2nd Weight Sex Delivery Anes PTL Lv  1 AB                ROS: A ROS was performed and pertinent positives and negatives are included in the history.  GENERAL: No fevers or chills. HEENT: No change in vision, no earache, sore throat or sinus congestion. NECK: No pain or stiffness. CARDIOVASCULAR: No chest pain or pressure. No palpitations. PULMONARY: No shortness of breath, cough or wheeze. GASTROINTESTINAL: No abdominal pain, nausea, vomiting or diarrhea, melena or bright red blood per rectum. GENITOURINARY: No urinary frequency, urgency, hesitancy or dysuria. MUSCULOSKELETAL: No joint or muscle pain, no back pain, no recent trauma. DERMATOLOGIC: No rash, no itching, no lesions. ENDOCRINE: No polyuria, polydipsia, no heat or cold intolerance. No  recent change in weight. HEMATOLOGICAL: No anemia or easy bruising or bleeding. NEUROLOGIC: No headache, seizures, numbness, tingling or weakness. PSYCHIATRIC: No depression, no loss of interest in normal activity or change in sleep pattern.     Exam: chaperone present  BP 160/92 mmHg  Ht 5' 5.25" (1.657 m)  Wt 155 lb (70.308 kg)  BMI 25.61 kg/m2  LMP 09/10/1978  Body mass index is 25.61 kg/(m^2).  General appearance : Well developed well nourished female. No acute distress HEENT: Eyes: no retinal hemorrhage or exudates,  Neck supple, trachea midline, no carotid bruits, no thyroidmegaly Lungs: Clear to auscultation, no rhonchi or wheezes, or rib retractions  Heart: Regular rate and rhythm, no murmurs or gallops Breast:Examined in sitting and supine position were symmetrical in appearance, no palpable masses or tenderness,  no skin retraction, no nipple inversion, no nipple discharge, no skin discoloration, no axillary or supraclavicular lymphadenopathy Abdomen: no palpable masses or tenderness, no rebound or guarding Extremities: no edema or skin discoloration or tenderness  Pelvic:  Bartholin, Urethra, Skene Glands: Within normal limits             Vagina: No gross lesions or discharge  Cervix: Absent  Uterus  , absent  Adnexa  Without masses or tenderness  Anus and perineum  normal   Rectovaginal  normal sphincter tone without palpated masses or tenderness             Hemoccult PCP provides  Assessment/Plan:  64 year old female she refuses to come off except the risk. We discussed the women's health initiative study outlining the potential risk of breast cancer as well as DVT and PE.Because of her history of CIN 3 in the past we will continue to do Pap smears every year as we did today. All her vaccinations are up-to-date. She had a normal bone density study in 2013 and her PCP will schedule. We discussed importance of calcium vitamin D and regular exercise for osteoporosis  prevention. Pap smear done today.   Terrance Mass MD, 4:42 PM 01/24/2015

## 2015-01-24 NOTE — Patient Instructions (Signed)
Shingles Vaccine What You Need to Know WHAT IS SHINGLES?  Shingles is a painful skin rash, often with blisters. It is also called Herpes Zoster or just Zoster.  A shingles rash usually appears on one side of the face or body and lasts from 2 to 4 weeks. Its main symptom is pain, which can be quite severe. Other symptoms of shingles can include fever, headache, chills, and upset stomach. Very rarely, a shingles infection can lead to pneumonia, hearing problems, blindness, brain inflammation (encephalitis), or death.  For about 1 person in 5, severe pain can continue even after the rash clears up. This is called post-herpetic neuralgia.  Shingles is caused by the Varicella Zoster virus. This is the same virus that causes chickenpox. Only someone who has had a case of chickenpox or rarely, has gotten chickenpox vaccine, can get shingles. The virus stays in your body. It can reappear many years later to cause a case of shingles.  You cannot catch shingles from another person with shingles. However, a person who has never had chickenpox (or chickenpox vaccine) could get chickenpox from someone with shingles. This is not very common.  Shingles is far more common in people 50 and older than in younger people. It is also more common in people whose immune systems are weakened because of a disease such as cancer or drugs such as steroids or chemotherapy.  At least 1 million people get shingles per year in the [place]. SHINGLES VACCINE  A vaccine for shingles was licensed in 2006. In clinical trials, the vaccine reduced the risk of shingles by 50%. It can also reduce the pain in people who still get shingles after being vaccinated.  A single dose of shingles vaccine is recommended for adults 60 years of age and older. SOME PEOPLE SHOULD NOT GET SHINGLES VACCINE OR SHOULD WAIT A person should not get shingles vaccine if he or she:  Has ever had a life-threatening allergic reaction to gelatin, the  antibiotic neomycin, or any other component of shingles vaccine. Tell your caregiver if you have any severe allergies.  Has a weakened immune system because of current:  AIDS or another disease that affects the immune system.  Treatment with drugs that affect the immune system, such as prolonged use of high-dose steroids.  Cancer treatment, such as radiation or chemotherapy.  Cancer affecting the bone marrow or lymphatic system, such as leukemia or lymphoma.  Is pregnant, or might be pregnant. Women should not become pregnant until at least 4 weeks after getting shingles vaccine. Someone with a minor illness, such as a cold, may be vaccinated. Anyone with a moderate or severe acute illness should usually wait until he or she recovers before getting the vaccine. This includes anyone with a temperature of 101.3 F (38 C) or higher. WHAT ARE THE RISKS FROM SHINGLES VACCINE?  A vaccine, like any medicine, could possibly cause serious problems, such as severe allergic reactions. However, the risk of a vaccine causing serious harm, or death, is extremely small.  No serious problems have been identified with shingles vaccine. Mild Problems  Redness, soreness, swelling, or itching at the site of the injection (about 1 person in 3).  Headache (about 1 person in 70). Like all vaccines, shingles vaccine is being closely monitored for unusual or severe problems. WHAT IF THERE IS A MODERATE OR SEVERE REACTION? What should I look for? Any unusual condition, such as a severe allergic reaction or a high fever. If a severe allergic reaction   occurred, it would be within a few minutes to an hour after the shot. Signs of a serious allergic reaction can include difficulty breathing, weakness, hoarseness or wheezing, a fast heartbeat, hives, dizziness, paleness, or swelling of the throat. What should I do?  Call your caregiver, or get the person to a caregiver right away.  Tell the caregiver what  happened, the date and time it happened, and when the vaccination was given.  Ask the caregiver to report the reaction by filing a Vaccine Adverse Event Reporting System (VAERS) form. Or, you can file this report through the VAERS web site at www.vaers.hhs.gov or by calling 1-800-822-7967. VAERS does not provide medical advice. HOW CAN I LEARN MORE?  Ask your caregiver. He or she can give you the vaccine package insert or suggest other sources of information.  Contact the Centers for Disease Control and Prevention (CDC):  Call 1-800-232-4636 (1-800-CDC-INFO).  Visit the CDC website at www.cdc.gov/vaccines CDC Shingles Vaccine VIS (06/15/08) Document Released: 06/24/2006 Document Revised: 11/19/2011 Document Reviewed: 12/17/2012 ExitCare Patient Information 2015 [employer]. This information is not intended to replace advice given to you by your health care provider. Make sure you discuss any questions you have with your health care provider.  

## 2015-01-26 ENCOUNTER — Telehealth: Payer: Self-pay | Admitting: Family Medicine

## 2015-01-26 LAB — CYTOLOGY - PAP

## 2015-01-26 NOTE — Telephone Encounter (Signed)
Pt called to ask if Dr Sarajane Jews can rx her LAMISIL . Pt had a visit with obgyn and doing her visit her  bp was high .    [place]

## 2015-01-28 NOTE — Telephone Encounter (Signed)
I left a voice message with below information. 

## 2015-01-28 NOTE — Telephone Encounter (Signed)
Her BP was quite normal when she was here one week ago. Lets follow this (she can check it at home several times a week) for a few weeks and see what we get

## 2015-03-08 ENCOUNTER — Other Ambulatory Visit: Payer: Self-pay | Admitting: Physician Assistant

## 2015-03-24 ENCOUNTER — Telehealth: Payer: Self-pay

## 2015-03-24 ENCOUNTER — Other Ambulatory Visit: Payer: Self-pay | Admitting: Gynecology

## 2015-03-24 MED ORDER — VIVELLE-DOT 0.05 MG/24HR TD PTTW: 1.0000 | MEDICATED_PATCH | TRANSDERMAL | Status: DC

## 2015-03-24 MED ORDER — ESTRADIOL 0.05 MG/24HR TD PTTW: 1.0000 | MEDICATED_PATCH | TRANSDERMAL | Status: DC

## 2015-03-24 NOTE — Telephone Encounter (Signed)
Patient called stating that at her [facility] visit in May she asked Dr. Moshe Salisbury to send her Vivelle patch 3 mos supply to [facility] order. She has not received Rx and called today and they told her Rx not sent yet. She asked if we can send 30 day supply to local pharmacy as she will be out of patches on Monday and mail order will take 2 weeks to receive. Also send Rx to mail-order.  Rx for month supply sent to local and 90 day supply sent to [place]. Patient informed.

## 2015-05-06 ENCOUNTER — Encounter: Payer: Self-pay | Admitting: Family Medicine

## 2015-05-06 ENCOUNTER — Ambulatory Visit (INDEPENDENT_AMBULATORY_CARE_PROVIDER_SITE_OTHER): Payer: 59 | Admitting: Family Medicine

## 2015-05-06 VITALS — BP 158/84 | HR 71 | Temp 98.4°F | Wt 154.0 lb

## 2015-05-06 DIAGNOSIS — J069 Acute upper respiratory infection, unspecified: Secondary | ICD-10-CM | POA: Diagnosis not present

## 2015-05-06 DIAGNOSIS — B9789 Other viral agents as the cause of diseases classified elsewhere: Principal | ICD-10-CM

## 2015-05-06 MED ORDER — GUAIFENESIN-CODEINE 100-10 MG/5ML PO SOLN: 2.5000 mL | Freq: Four times a day (QID) | ORAL | Status: DC | PRN

## 2015-05-06 NOTE — Progress Notes (Signed)
Garret Reddish, MD  Subjective:  Melody Sutton is a 64 y.o. year old very pleasant female patient who presents with:  Cough, congestion -Tuesday started with sneezing, coughing, watery eyes, nasal congestion. Coughing worst part. Mainly dry, starting to feel a little more loose. Struggle to sleep at night. No sick contacts. Decreased appetite. Mild sore throat- no contact with kids. OTC antihistamine for allergies at baseline. Slightly better today.   ROS- temperature running in 99s but no fever, some chills. No nausea/vomiting. No sinus pressure. Mild headaches in AM.   Past Medical History- hypertension, hyperlipidemia, arthritis, insomnia- still not sleeping despite ambien  Medications- reviewed and updated Current Outpatient Prescriptions  Medication Sig Dispense Refill  . atenolol-chlorthalidone (TENORETIC) 50-25 MG per tablet Take 1 tablet by mouth daily. 90 tablet 0  . atorvastatin (LIPITOR) 40 MG tablet TAKE 1 TABLET DAILY 90 tablet 0  . calcium carbonate (TUMS - DOSED IN MG ELEMENTAL CALCIUM) 500 MG chewable tablet Chew 1 tablet (200 mg of elemental calcium total) by mouth 2 (two) times daily. 180 tablet 3  . cholecalciferol (VITAMIN D) 1000 UNITS tablet Take 1 tablet (1,000 Units total) by mouth daily. 90 tablet 3  . estradiol (VIVELLE-DOT) 0.05 MG/24HR patch Place 1 patch (0.05 mg total) onto the skin 2 (two) times a week. 8 patch 0  . KLOR-CON M20 20 MEQ tablet TAKE 1 TABLET DAILY 90 tablet 0  . meloxicam (MOBIC) 15 MG tablet TAKE 1 TABLET DAILY 90 tablet 0  . OVER THE COUNTER MEDICATION [facility] brand 24-hour nondrowsy allergy med daily equivalent to Claritin    . polyethylene glycol powder (GLYCOLAX/MIRALAX) powder Take 17 g by mouth 2 (two) times daily as needed. 3350 g 1  . vitamin E 1000 UNIT capsule Take 1 capsule (1,000 Units total) by mouth daily. 90 capsule 3  . zolpidem (AMBIEN) 10 MG tablet TAKE 1 TABLET EVERY DAY AT BEDTIME AS NEEDED (Patient not taking: Reported  on 05/06/2015) 90 tablet 1   No current facility-administered medications for this visit.   Objective: BP 158/84 mmHg  Pulse 71  Temp(Src) 98.4 F (36.9 C)  Wt 154 lb (69.854 kg)  SpO2 96%  LMP 09/10/1978 Gen: NAD, resting comfortably, appears fatigued Tm normal, pharynx with mild erythema, no tonsillar exudate or swelling, no lymphadenopathy cervical, no sinus tenderness,  Nose with clear rhinorrhea CV: RRR no murmurs rubs or gallops Lungs: CTAB no crackles, wheeze, rhonchi Abdomen: soft/nontender/nondistended/normal bowel sounds. No rebound or guarding.  Ext: no edema Skin: warm, dry, no rash Neuro: grossly normal, moves all extremities  Assessment/Plan:  Viral URI with cough Symptomatic care with codeine cough syrup as cough is most bothersome issue Return precautions given Discussed no indication for antibiotics at this point  Will follow up with Dr. Sarajane Jews about Bp- slight elevations last 2 visits- likely today due to feeling poorly and up all night due to cold  Meds ordered this encounter  Medications  . guaiFENesin-codeine 100-10 MG/5ML syrup    Sig: Take 2.5-5 mLs by mouth every 6 (six) hours as needed for cough.    Dispense:  180 mL    Refill:  0

## 2015-05-06 NOTE — Patient Instructions (Signed)
Viral upper respiratory infection vs. Sinusitis  Codeine cough syrup for symptom relief  Return to care if shortness of breath, symptoms not improving significantly by 10 days, or symptoms getting better than get worse

## 2015-05-09 ENCOUNTER — Ambulatory Visit (INDEPENDENT_AMBULATORY_CARE_PROVIDER_SITE_OTHER): Payer: 59 | Admitting: Family Medicine

## 2015-05-09 ENCOUNTER — Encounter: Payer: Self-pay | Admitting: Family Medicine

## 2015-05-09 VITALS — BP 161/93 | HR 66 | Temp 98.1°F | Ht 65.25 in | Wt 150.0 lb

## 2015-05-09 DIAGNOSIS — J019 Acute sinusitis, unspecified: Secondary | ICD-10-CM

## 2015-05-09 MED ORDER — AMOXICILLIN-POT CLAVULANATE 875-125 MG PO TABS: 1.0000 | ORAL_TABLET | Freq: Two times a day (BID) | ORAL | Status: DC

## 2015-05-09 NOTE — Progress Notes (Signed)
Pre visit review using our clinic review tool, if applicable. No additional management support is needed unless otherwise documented below in the visit note. 

## 2015-05-09 NOTE — Progress Notes (Signed)
   Subjective:    Patient ID: Melody Sutton, female    DOB: [birth year], 64 y.o.   MRN: [id number]  HPI Here for 2 weeks of sinus pressure, blowing green mucus from the nose, PND, and coughing up mucus. No fever. She was here last week for this and it was felt to be viral at that time.    Review of Systems  Constitutional: Negative.   HENT: Positive for congestion, postnasal drip and sinus pressure.   Eyes: Negative.   Respiratory: Positive for cough. Negative for shortness of breath.        Objective:   Physical Exam  Constitutional: She appears well-developed and well-nourished.  HENT:  Right Ear: External ear normal.  Left Ear: External ear normal.  Nose: Nose normal.  Mouth/Throat: Oropharynx is clear and moist.  Eyes: Conjunctivae are normal.  Neck: No thyromegaly present.  Pulmonary/Chest: Effort normal and breath sounds normal.  Lymphadenopathy:    She has no cervical adenopathy.          Assessment & Plan:  Sinusitis, treat with Augmentin.

## 2015-05-20 ENCOUNTER — Telehealth: Payer: Self-pay | Admitting: Family Medicine

## 2015-05-20 MED ORDER — HYDROCODONE-HOMATROPINE 5-1.5 MG/5ML PO SYRP: 5.0000 mL | ORAL_SOLUTION | ORAL | Status: DC | PRN

## 2015-05-20 NOTE — Telephone Encounter (Signed)
Pt request refill of the following: guaiFENesin-codeine 100-10 MG/5ML syrup  Pt said Dr Yong Channel rx this med and is asking for a refill     Phamacy:

## 2015-05-20 NOTE — Telephone Encounter (Signed)
I will give her a bottle of Hydromet syrup

## 2015-05-23 NOTE — Telephone Encounter (Signed)
Script is ready for pick up and I left a voice message for pt. 

## 2015-06-21 ENCOUNTER — Other Ambulatory Visit: Payer: Self-pay

## 2015-06-21 MED ORDER — ESTRADIOL 0.05 MG/24HR TD PTTW: 1.0000 | MEDICATED_PATCH | TRANSDERMAL | Status: DC

## 2015-06-27 ENCOUNTER — Other Ambulatory Visit: Payer: Self-pay

## 2015-06-27 ENCOUNTER — Telehealth: Payer: Self-pay

## 2015-06-27 MED ORDER — ATORVASTATIN CALCIUM 40 MG PO TABS: 40.0000 mg | ORAL_TABLET | Freq: Every day | ORAL | Status: DC

## 2015-06-27 NOTE — Telephone Encounter (Signed)
Refill request Zolpidem 10mg  #90. [place].

## 2015-06-28 NOTE — Telephone Encounter (Signed)
Call in #90 with one rf 

## 2015-06-29 MED ORDER — ZOLPIDEM TARTRATE 10 MG PO TABS: ORAL_TABLET | ORAL | Status: DC

## 2015-06-29 NOTE — Telephone Encounter (Signed)
I called in script 

## 2015-06-29 NOTE — Addendum Note (Signed)
Addended by: Aggie Hacker A on: 06/29/2015 03:16 PM   Modules accepted: Orders

## 2015-08-17 ENCOUNTER — Other Ambulatory Visit: Payer: Self-pay | Admitting: Family Medicine

## 2015-08-17 MED ORDER — MELOXICAM 15 MG PO TABS: 15.0000 mg | ORAL_TABLET | Freq: Every day | ORAL | Status: DC

## 2015-08-17 MED ORDER — POTASSIUM CHLORIDE CRYS ER 20 MEQ PO TBCR: 20.0000 meq | EXTENDED_RELEASE_TABLET | Freq: Every day | ORAL | Status: DC

## 2015-08-22 ENCOUNTER — Telehealth: Payer: Self-pay | Admitting: *Deleted

## 2015-08-22 NOTE — Telephone Encounter (Signed)
PA form faxed to [facility] for vivelle-dot patch 0.05 mg, will wait for response.

## 2015-08-26 NOTE — Telephone Encounter (Signed)
Medication approved until 09/09/2016

## 2015-10-19 ENCOUNTER — Other Ambulatory Visit: Payer: Self-pay | Admitting: *Deleted

## 2015-10-19 NOTE — Telephone Encounter (Signed)
[facility] [place] [phone] Refill request zolpidem (AMBIEN) 10 MG tablet

## 2015-10-19 NOTE — Telephone Encounter (Signed)
Refill for 6 months. 

## 2015-10-20 MED ORDER — ZOLPIDEM TARTRATE 10 MG PO TABS: ORAL_TABLET | ORAL | Status: DC

## 2015-12-01 ENCOUNTER — Other Ambulatory Visit: Payer: Self-pay | Admitting: Gynecology

## 2015-12-07 ENCOUNTER — Other Ambulatory Visit: Payer: Self-pay | Admitting: Family Medicine

## 2015-12-07 NOTE — Telephone Encounter (Signed)
Can we refill this? 

## 2015-12-08 MED ORDER — POTASSIUM CHLORIDE CRYS ER 20 MEQ PO TBCR: 20.0000 meq | EXTENDED_RELEASE_TABLET | Freq: Every day | ORAL | Status: DC

## 2015-12-16 ENCOUNTER — Other Ambulatory Visit: Payer: Self-pay | Admitting: Family Medicine

## 2016-01-25 ENCOUNTER — Encounter: Payer: Self-pay | Admitting: Gynecology

## 2016-01-25 ENCOUNTER — Ambulatory Visit (INDEPENDENT_AMBULATORY_CARE_PROVIDER_SITE_OTHER): Payer: Medicare Other | Admitting: Gynecology

## 2016-01-25 VITALS — BP 136/86 | Ht 65.0 in | Wt 155.0 lb

## 2016-01-25 DIAGNOSIS — Z01419 Encounter for gynecological examination (general) (routine) without abnormal findings: Secondary | ICD-10-CM | POA: Diagnosis not present

## 2016-01-25 DIAGNOSIS — Z87410 Personal history of cervical dysplasia: Secondary | ICD-10-CM

## 2016-01-25 DIAGNOSIS — N951 Menopausal and female climacteric states: Secondary | ICD-10-CM

## 2016-01-25 DIAGNOSIS — Z79890 Hormone replacement therapy (postmenopausal): Secondary | ICD-10-CM

## 2016-01-25 NOTE — Patient Instructions (Signed)

## 2016-01-25 NOTE — Progress Notes (Addendum)
ZOEYLYNN CELESTINE 07-09-1951 [id number]   History:    65 y.o.  for annual gyn exam  With hot flashes especially during the night. Patient had been on Vivelle-Dot and she states that we had switched her because of the cost although I do not have that information  But she will obtain the name of the transdermal patch that she is using now twice a week  And the dosage indicated in the event we need to increase her dose because of her symptoms.History of benign ovarian cystectomy in [place] in 1975. Dr. Sarajane Jews is her primary physician who will be doing her labs next week. Normal bone density in 2013.   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 Follow up pap smears have been normal. Patient has been doing well on twice a week Vivelle Dot (0.05 mg). Review of records indicated that her last colonoscopy   In 2015 she was found have benign polyps and she is in her 5 year recall.   patient has informed me that she had been on low-dose transdermal estrogen patch for 10 years that has attempted to come off of it in the past and has been unsuccessful because of her severe vasomotor symptoms.  Past medical history,surgical history, family history and social history were all reviewed and documented in the EPIC chart.  Gynecologic History Patient's last menstrual period was 09/10/1978. Contraception: status post hysterectomy Last Pap:  2016. Results were: normal Last mammogram:  2016. Results were: normal  Obstetric History OB History  Gravida Para Term Preterm AB SAB TAB Ectopic Multiple Living  1    1         # Outcome Date GA Lbr Len/2nd Weight Sex Delivery Anes PTL Lv  1 AB                ROS: A ROS was performed and pertinent positives and negatives are included in the history.  GENERAL: No fevers or chills. HEENT: No change in vision, no earache, sore throat or sinus congestion. NECK: No pain or stiffness. CARDIOVASCULAR: No chest pain or pressure. No palpitations. PULMONARY: No  shortness of breath, cough or wheeze. GASTROINTESTINAL: No abdominal pain, nausea, vomiting or diarrhea, melena or bright red blood per rectum. GENITOURINARY: No urinary frequency, urgency, hesitancy or dysuria. MUSCULOSKELETAL: No joint or muscle pain, no back pain, no recent trauma. DERMATOLOGIC: No rash, no itching, no lesions. ENDOCRINE: No polyuria, polydipsia, no heat or cold intolerance. No recent change in weight. HEMATOLOGICAL: No anemia or easy bruising or bleeding. NEUROLOGIC: No headache, seizures, numbness, tingling or weakness. PSYCHIATRIC: No depression, no loss of interest in normal activity or change in sleep pattern.     Exam: chaperone present  BP 136/86 mmHg  Ht 5\' 5"  (1.651 m)  Wt 155 lb (70.308 kg)  BMI 25.79 kg/m2  LMP 09/10/1978  Body mass index is 25.79 kg/(m^2).  General appearance : Well developed well nourished female. No acute distress HEENT: Eyes: no retinal hemorrhage or exudates,  Neck supple, trachea midline, no carotid bruits, no thyroidmegaly Lungs: Clear to auscultation, no rhonchi or wheezes, or rib retractions  Heart: Regular rate and rhythm, no murmurs or gallops Breast:Examined in sitting and supine position were symmetrical in appearance, no palpable masses or tenderness,  no skin retraction, no nipple inversion, no nipple discharge, no skin discoloration, no axillary or supraclavicular lymphadenopathy Abdomen: no palpable masses or tenderness, no rebound or guarding Extremities: no edema or skin discoloration or  tenderness  Pelvic:  Bartholin, Urethra, Skene Glands: Within normal limits             Vagina: No gross lesions or discharge , atrophic changes  Cervix:  absent  Uterus   absent  Adnexa  Without masses or tenderness  Anus and perineum  normal   Rectovaginal  normal sphincter tone without palpated masses or tenderness             Hemoccult  PCP provides colonoscopy  In 2015     Assessment/Plan:  65 y.o. female for annual exam  On  transdermal estrogen patch will call the office within a month and patched that her insurance is covering and the dosage so that we may want to consider a slightly increasing her dose to help with her vasomotor symptoms are still persisting especially at night. I have offered her also to buy over-the-counter and her health food store peppermint oral to apply behind her ear before bedtime or when necessary. I provided her with a requisition to schedule her overdue mammogram and bone density. We discussed importance of calcium vitamin D and weightbearing exercise for osteoporosis prevention. Because of her history of severe dysplasia prior to her hysterectomy a Pap smear was done today. The risk benefits and pros and cons of hormone replacement therapy were discussed.   Terrance Mass MD, 5:15 PM 01/25/2016

## 2016-01-26 LAB — PAP IG W/ RFLX HPV ASCU

## 2016-02-03 ENCOUNTER — Other Ambulatory Visit: Payer: Self-pay

## 2016-02-03 MED ORDER — MELOXICAM 15 MG PO TABS: 15.0000 mg | ORAL_TABLET | Freq: Every day | ORAL | Status: DC

## 2016-02-03 NOTE — Telephone Encounter (Signed)
Pt is requesting a refill. Last filled on 08/17/15 last OV 05/09/15 okay to refill?

## 2016-02-03 NOTE — Telephone Encounter (Signed)
Refill for one year 

## 2016-02-07 ENCOUNTER — Telehealth: Payer: Self-pay | Admitting: *Deleted

## 2016-02-07 NOTE — Telephone Encounter (Signed)
Pt called requesting refill on HRT, pt left message in voicemail, bu the message was unclear of the medication. I called and left a message for pt to call me back.

## 2016-02-09 MED ORDER — ESTRADIOL 0.075 MG/24HR TD PTTW: 1.0000 | MEDICATED_PATCH | TRANSDERMAL | Status: DC

## 2016-02-09 NOTE — Telephone Encounter (Signed)
Rx sent 

## 2016-02-09 NOTE — Telephone Encounter (Signed)
Dr.Fernandez pt called regarding estradiol patch per note 01/25/16   "On transdermal estrogen patch will call the office within a month and patched that her insurance is covering and the dosage so that we may want to consider a slightly increasing her dose to help with her vasomotor symptoms are still persisting especially at night"  Pt said she would like to increase estradiol patch to 0.075 mg due symptoms. Please advise

## 2016-02-09 NOTE — Telephone Encounter (Signed)
Please call in prescription for estradiol patch 0.075 mg to apply twice a week 3 months supply with 4 refills

## 2016-02-18 DIAGNOSIS — Z1231 Encounter for screening mammogram for malignant neoplasm of breast: Secondary | ICD-10-CM | POA: Diagnosis not present

## 2016-02-18 DIAGNOSIS — Z803 Family history of malignant neoplasm of breast: Secondary | ICD-10-CM | POA: Diagnosis not present

## 2016-02-27 ENCOUNTER — Encounter: Payer: Self-pay | Admitting: Family Medicine

## 2016-03-01 ENCOUNTER — Telehealth: Payer: Self-pay | Admitting: Family Medicine

## 2016-03-01 NOTE — Telephone Encounter (Signed)
Pt need new Rx for atenolol-chlorthalidone 50-25 mg     Pharm:  [facility]

## 2016-03-02 ENCOUNTER — Other Ambulatory Visit: Payer: Self-pay

## 2016-03-02 MED ORDER — ATENOLOL-CHLORTHALIDONE 50-25 MG PO TABS: 1.0000 | ORAL_TABLET | Freq: Every day | ORAL | Status: DC

## 2016-03-02 NOTE — Telephone Encounter (Signed)
Script was sent e-scribe 

## 2016-03-07 ENCOUNTER — Encounter: Payer: 59 | Admitting: Family Medicine

## 2016-03-28 ENCOUNTER — Encounter: Payer: Self-pay | Admitting: Family Medicine

## 2016-03-28 ENCOUNTER — Ambulatory Visit (INDEPENDENT_AMBULATORY_CARE_PROVIDER_SITE_OTHER): Payer: Medicare Other | Admitting: Family Medicine

## 2016-03-28 VITALS — BP 148/87 | HR 66 | Temp 98.2°F | Ht 65.0 in | Wt 154.0 lb

## 2016-03-28 DIAGNOSIS — Z0000 Encounter for general adult medical examination without abnormal findings: Secondary | ICD-10-CM

## 2016-03-28 DIAGNOSIS — B029 Zoster without complications: Secondary | ICD-10-CM

## 2016-03-28 LAB — CBC WITH DIFFERENTIAL/PLATELET
Basophils Absolute: 0 K/uL (ref 0.0–0.1)
Basophils Relative: 0.5 % (ref 0.0–3.0)
Eosinophils Absolute: 0.3 K/uL (ref 0.0–0.7)
Eosinophils Relative: 3 % (ref 0.0–5.0)
HCT: 40.5 % (ref 36.0–46.0)
Hemoglobin: 13.4 g/dL (ref 12.0–15.0)
Lymphocytes Relative: 20.3 % (ref 12.0–46.0)
Lymphs Abs: 1.8 K/uL (ref 0.7–4.0)
MCHC: 33.2 g/dL (ref 30.0–36.0)
MCV: 88.1 fl (ref 78.0–100.0)
Monocytes Absolute: 0.7 K/uL (ref 0.1–1.0)
Monocytes Relative: 8.1 % (ref 3.0–12.0)
Neutro Abs: 5.9 K/uL (ref 1.4–7.7)
Neutrophils Relative %: 68.1 % (ref 43.0–77.0)
Platelets: 228 K/uL (ref 150.0–400.0)
RBC: 4.6 Mil/uL (ref 3.87–5.11)
RDW: 14.4 % (ref 11.5–15.5)
WBC: 8.7 K/uL (ref 4.0–10.5)

## 2016-03-28 LAB — BASIC METABOLIC PANEL
BUN: 14 mg/dL (ref 6–23)
CALCIUM: 9.8 mg/dL (ref 8.4–10.5)
CO2: 32 meq/L (ref 19–32)
CREATININE: 0.64 mg/dL (ref 0.40–1.20)
Chloride: 99 mEq/L (ref 96–112)
GFR: 98.72 mL/min (ref >60.00)
GLUCOSE: 97 mg/dL (ref 70–99)
Potassium: 3.6 mEq/L (ref 3.5–5.1)
SODIUM: 138 meq/L (ref 135–145)

## 2016-03-28 LAB — POC URINALSYSI DIPSTICK (AUTOMATED)
Bilirubin, UA: NEGATIVE
Glucose, UA: NEGATIVE
Ketones, UA: NEGATIVE
Leukocytes, UA: NEGATIVE
NITRITE UA: NEGATIVE
PH UA: 8.5
SPEC GRAV UA: 1.015
UROBILINOGEN UA: 0.2

## 2016-03-28 LAB — HEPATIC FUNCTION PANEL
ALBUMIN: 4.4 g/dL (ref 3.5–5.2)
ALK PHOS: 58 U/L (ref 39–117)
ALT: 22 U/L (ref 0–35)
AST: 24 U/L (ref 0–37)
Bilirubin, Direct: 0.2 mg/dL (ref 0.0–0.3)
TOTAL PROTEIN: 6.8 g/dL (ref 6.0–8.3)
Total Bilirubin: 1 mg/dL (ref 0.2–1.2)

## 2016-03-28 LAB — LIPID PANEL
Cholesterol: 187 mg/dL (ref 0–200)
HDL: 85.1 mg/dL
LDL Cholesterol: 88 mg/dL (ref 0–99)
NonHDL: 101.79
Total CHOL/HDL Ratio: 2
Triglycerides: 69 mg/dL (ref 0.0–149.0)
VLDL: 13.8 mg/dL (ref 0.0–40.0)

## 2016-03-28 LAB — TSH: TSH: 0.68 u[IU]/mL (ref 0.35–4.50)

## 2016-03-28 MED ORDER — ATENOLOL-CHLORTHALIDONE 50-25 MG PO TABS: 1.0000 | ORAL_TABLET | Freq: Every day | ORAL | Status: DC

## 2016-03-28 MED ORDER — VALACYCLOVIR HCL 1 G PO TABS: 1000.0000 mg | ORAL_TABLET | Freq: Three times a day (TID) | ORAL | Status: DC

## 2016-03-28 MED ORDER — METHYLPREDNISOLONE 4 MG PO TBPK: ORAL_TABLET | ORAL | Status: DC

## 2016-03-28 MED ORDER — ATORVASTATIN CALCIUM 40 MG PO TABS: ORAL_TABLET | ORAL | Status: DC

## 2016-03-28 NOTE — Progress Notes (Signed)
   Subjective:    Patient ID: Melody Sutton, female    DOB: August 25, 1951, 65 y.o.   MRN: [id number]  HPI 65 yr old female for a well exam. She has been doing well with good BP control, however when she got up this morning she noticed a rash on the right flank. There are no symptoms with this. Of note she had one episode of shingles many years ago.    Review of Systems  Constitutional: Negative.   HENT: Negative.   Eyes: Negative.   Respiratory: Negative.   Cardiovascular: Negative.   Gastrointestinal: Negative.   Genitourinary: Negative for dysuria, urgency, frequency, hematuria, flank pain, decreased urine volume, enuresis, difficulty urinating, pelvic pain and dyspareunia.  Musculoskeletal: Negative.   Skin: Positive for rash. Negative for color change, pallor and wound.  Neurological: Negative.   Psychiatric/Behavioral: Negative.        Objective:   Physical Exam  Constitutional: She is oriented to person, place, and time. She appears well-developed and well-nourished. No distress.  HENT:  Head: Normocephalic and atraumatic.  Right Ear: External ear normal.  Left Ear: External ear normal.  Nose: Nose normal.  Mouth/Throat: Oropharynx is clear and moist. No oropharyngeal exudate.  Eyes: Conjunctivae and EOM are normal. Pupils are equal, round, and reactive to light. No scleral icterus.  Neck: Normal range of motion. Neck supple. No JVD present. No thyromegaly present.  Cardiovascular: Normal rate, regular rhythm, normal heart sounds and intact distal pulses.  Exam reveals no gallop and no friction rub.   No murmur heard. EKG normal   Pulmonary/Chest: Effort normal and breath sounds normal. No respiratory distress. She has no wheezes. She has no rales. She exhibits no tenderness.  Abdominal: Soft. Bowel sounds are normal. She exhibits no distension and no mass. There is no tenderness. There is no rebound and no guarding.  Musculoskeletal: Normal range of motion. She exhibits no  edema or tenderness.  Lymphadenopathy:    She has no cervical adenopathy.  Neurological: She is alert and oriented to person, place, and time. She has normal reflexes. No cranial nerve deficit. She exhibits normal muscle tone. Coordination normal.  Skin: Skin is warm and dry. No erythema.  There is a patch of red macules and papules in the right flank  Psychiatric: She has a normal mood and affect. Her behavior is normal. Judgment and thought content normal.          Assessment & Plan:  Well exam. We discussed diet and exercise. Get fasting labs. She has early shingles so we will treat with Valtrex and steroids.  Laurey Morale, MD

## 2016-03-28 NOTE — Progress Notes (Signed)
Pre visit review using our clinic review tool, if applicable. No additional management support is needed unless otherwise documented below in the visit note. 

## 2016-04-26 ENCOUNTER — Telehealth: Payer: Self-pay | Admitting: Emergency Medicine

## 2016-04-26 NOTE — Telephone Encounter (Signed)
Pt requesting Zolpidem Tartrate 10 mg tab  List filled 10/20/15 #90 refill 1   Last seen in office 03/28/16   Okay to refill?

## 2016-04-27 NOTE — Telephone Encounter (Signed)
Call in #90 with one rf 

## 2016-04-30 ENCOUNTER — Other Ambulatory Visit: Payer: Self-pay

## 2016-04-30 MED ORDER — ZOLPIDEM TARTRATE 10 MG PO TABS: ORAL_TABLET | ORAL | 1 refills | Status: DC

## 2016-04-30 NOTE — Telephone Encounter (Signed)
Called in to [facility].

## 2016-05-29 ENCOUNTER — Encounter: Payer: Self-pay | Admitting: Family Medicine

## 2016-05-29 ENCOUNTER — Encounter: Payer: Self-pay | Admitting: *Deleted

## 2016-05-29 ENCOUNTER — Ambulatory Visit (INDEPENDENT_AMBULATORY_CARE_PROVIDER_SITE_OTHER): Payer: Medicare Other | Admitting: Family Medicine

## 2016-05-29 VITALS — BP 132/82 | HR 80 | Temp 99.7°F | Ht 65.0 in | Wt 156.2 lb

## 2016-05-29 DIAGNOSIS — J069 Acute upper respiratory infection, unspecified: Secondary | ICD-10-CM

## 2016-05-29 LAB — POCT INFLUENZA A/B: INFLUENZA A, POC: NEGATIVE

## 2016-05-29 MED ORDER — BENZONATATE 100 MG PO CAPS: 100.0000 mg | ORAL_CAPSULE | Freq: Two times a day (BID) | ORAL | 0 refills | Status: DC | PRN

## 2016-05-29 MED ORDER — AZITHROMYCIN 250 MG PO TABS: ORAL_TABLET | ORAL | 0 refills | Status: DC

## 2016-05-29 NOTE — Progress Notes (Signed)
Pre visit review using our clinic review tool, if applicable. No additional management support is needed unless otherwise documented below in the visit note. 

## 2016-05-29 NOTE — Progress Notes (Signed)
HPI:  Acute visit for Cough and congestion -started: About 3-4 days ago -symptoms:nasal congestion, sore throat, cough, fever - up to 100.4 today, malaise -denies: SOB, NVD, tooth pain -has tried: Nothing -sick contacts/travel/risks: no reported flu, strep or tick exposure  ROS: See pertinent positives and negatives per HPI.  Past Medical History:  Diagnosis Date  . CIN III (cervical intraepithelial neoplasia grade III) with severe dysplasia   . Dysmenorrhea   . Granuloma annulare   . Hypertension   . Ovarian cyst     Past Surgical History:  Procedure Laterality Date  . COLONOSCOPY  02-17-14   per Dr. Olevia Perches, adenomatous polyps, repeat in 5 yrs   . OVARIAN CYST SURGERY    . TUBAL LIGATION    . VAGINAL HYSTERECTOMY      Family History  Problem Relation Age of Onset  . Hypertension Mother   . Breast cancer Maternal Aunt   . Cancer Maternal Aunt     Vulvar Ca in situ  . Diabetes Maternal Uncle   . Colon cancer Neg Hx   . Pancreatic cancer Neg Hx   . Stomach cancer Neg Hx     Social History   Social History  . Marital status: Married    Spouse name: N/A  . Number of children: N/A  . Years of education: N/A   Social History Main Topics  . Smoking status: Former Smoker    Quit date: 06/11/1991  . Smokeless tobacco: Never Used  . Alcohol use 0.6 oz/week    1 Glasses of wine per week     Comment: daily  . Drug use: No  . Sexual activity: Yes    Birth control/ protection: Surgical   Other Topics Concern  . None   Social History Narrative  . None     Current Outpatient Prescriptions:  .  atenolol-chlorthalidone (TENORETIC) 50-25 MG tablet, Take 1 tablet by mouth daily., Disp: 90 tablet, Rfl: 3 .  atorvastatin (LIPITOR) 40 MG tablet, Take 1 tablet (40 mg total) by mouth daily., Disp: 90 tablet, Rfl: 3 .  calcium carbonate (TUMS - DOSED IN MG ELEMENTAL CALCIUM) 500 MG chewable tablet, Chew 1 tablet (200 mg of elemental calcium total) by mouth 2 (two) times  daily., Disp: 180 tablet, Rfl: 3 .  cholecalciferol (VITAMIN D) 1000 UNITS tablet, Take 1 tablet (1,000 Units total) by mouth daily. (Patient taking differently: Take 400 Units by mouth daily. ), Disp: 90 tablet, Rfl: 3 .  Cyanocobalamin (VITAMIN B 12 PO), Take 1,000 mcg by mouth daily., Disp: , Rfl:  .  estradiol (VIVELLE-DOT) 0.075 MG/24HR, Place 1 patch onto the skin 2 (two) times a week., Disp: 24 patch, Rfl: 3 .  meloxicam (MOBIC) 15 MG tablet, Take 1 tablet (15 mg total) by mouth daily., Disp: 90 tablet, Rfl: 3 .  OVER THE COUNTER MEDICATION, [facility] brand 24-hour nondrowsy allergy med daily equivalent to Claritin, Disp: , Rfl:  .  polyethylene glycol powder (GLYCOLAX/MIRALAX) powder, Take 17 g by mouth 2 (two) times daily as needed., Disp: 3350 g, Rfl: 1 .  potassium chloride SA (KLOR-CON M20) 20 MEQ tablet, Take 1 tablet (20 mEq total) by mouth daily., Disp: 90 tablet, Rfl: 3 .  pyridOXINE (VITAMIN B-6) 100 MG tablet, Take 100 mg by mouth daily., Disp: , Rfl:  .  valACYclovir (VALTREX) 1000 MG tablet, Take 1 tablet (1,000 mg total) by mouth 3 (three) times daily., Disp: 30 tablet, Rfl: 0 .  vitamin E 1000 UNIT capsule,  Take 1 capsule (1,000 Units total) by mouth daily. (Patient taking differently: Take 400 Units by mouth daily. ), Disp: 90 capsule, Rfl: 3 .  zolpidem (AMBIEN) 10 MG tablet, TAKE 1 TABLET EVERY DAY AT BEDTIME AS NEEDED, Disp: 90 tablet, Rfl: 1 .  azithromycin (ZITHROMAX) 250 MG tablet, 2 tabs day 1, then one tab daily, Disp: 6 tablet, Rfl: 0 .  benzonatate (TESSALON) 100 MG capsule, Take 1 capsule (100 mg total) by mouth 2 (two) times daily as needed for cough., Disp: 20 capsule, Rfl: 0  EXAM:  Vitals:   05/29/16 1643  BP: 132/82  Pulse: 80  Temp: 99.7 F (37.6 C)    Body mass index is 25.99 kg/m.  GENERAL: vitals reviewed and listed above, alert, oriented, appears well hydrated and in no acute distress  HEENT: atraumatic, conjunttiva clear, no obvious  abnormalities on inspection of external nose and ears, normal appearance of ear canals and TMs, clear nasal congestion, mild post oropharyngeal erythema with PND, no tonsillar edema or exudate, no sinus TTP  NECK: no obvious masses on inspection  LUNGS: clear to auscultation bilaterally, no wheezes, rales or rhonchi - except for questionable mild rhonchorous breath sounds in the right base, good air movement  CV: HRRR, no peripheral edema  MS: moves all extremities without noticeable abnormality  PSYCH: pleasant and cooperative, no obvious depression or anxiety  ASSESSMENT AND PLAN:  Discussed the following assessment and plan:  Acute upper respiratory infection - Plan: azithromycin (ZITHROMAX) 250 MG tablet, DG Chest 2 View, POC Influenza A/B  We discussed potential etiologies, with viral respiratory illness being most likely, but with mildly abnormal lung exam and mildly low O2 sats discussed possibility of mild pneumonia. She would prefer to treat with antibiotic only if confirmed on x-ray. However it is too late today to get a chest x-ray so we'll start Z-Pak. Did also please order for chest x-ray if she wishes to pursue, and couldn't stop antibiotic if x-ray negative. Cough medicine also provided We discussed treatment side effects, likely course, antibiotic misuse, transmission, and signs of developing a serious illness. -of course, we advised to return or notify a doctor immediately if symptoms worsen or persist or new concerns arise.    Patient Instructions  BEFORE YOU LEAVE: -xray sheet -work note  Start the antibiotic. If you wish, get the xray and if it is normal you may stop the antibiotic once we contact you with normal result.  It was nice to meet you today. I hope he feel better seen. I did also send a cough medication to your pharmacy.  INSTRUCTIONS FOR UPPER RESPIRATORY INFECTION:  -plenty of rest and fluids  -nasal saline wash 2-3 times daily (use prepackaged  nasal saline or bottled/distilled water if making your own)   -can use AFRIN nasal spray for drainage and nasal congestion - but do NOT use longer then 3-4 days  -can use tylenol (in no history of liver disease) or ibuprofen (if no history of kidney disease, bowel bleeding or significant heart disease) as directed for aches and sorethroat  -in the winter time, using a humidifier at night is helpful (please follow cleaning instructions)  -if you are taking a cough medication - use only as directed, may also try a teaspoon of honey to coat the throat and throat lozenges.   -for sore throat, salt water gargles can help  -follow up if you have fevers, facial pain, tooth pain, difficulty breathing or are worsening or symptoms persist longer  then expected  Upper Respiratory Infection, Adult An upper respiratory infection (URI) is also known as the common cold. It is often caused by a type of germ (virus). Colds are easily spread (contagious). You can pass it to others by kissing, coughing, sneezing, or drinking out of the same glass. Usually, you get better in 1 to 3  weeks.  However, the cough can last for even longer. HOME CARE   Only take medicine as told by your doctor. Follow instructions provided above.  Drink enough water and fluids to keep your pee (urine) clear or pale yellow.  Get plenty of rest.  Return to work when your temperature is < 100 for 24 hours or as told by your doctor. You may use a face mask and wash your hands to stop your cold from spreading. GET HELP RIGHT AWAY IF:   After the first few days, you feel you are getting worse.  You have questions about your medicine.  You have chills, shortness of breath, or red spit (mucus).  You have pain in the face for more then 1-2 days, especially when you bend forward.  You have a fever, puffy (swollen) neck, pain when you swallow, or white spots in the back of your throat.  You have a bad headache, ear pain, sinus pain,  or chest pain.  You have a high-pitched whistling sound when you breathe in and out (wheezing).  You cough up blood.  You have sore muscles or a stiff neck. MAKE SURE YOU:   Understand these instructions.  Will watch your condition.  Will get help right away if you are not doing well or get worse. Document Released: 02/13/2008 Document Revised: 11/19/2011 Document Reviewed: 12/02/2013 [facility] Patient Information 2015 [place], [place]. This information is not intended to replace advice given to you by your health care provider. Make sure you discuss any questions you have with your health care provider.     Colin Benton R., DO

## 2016-05-29 NOTE — Patient Instructions (Signed)
BEFORE YOU LEAVE: -xray sheet -work note  Start the antibiotic. If you wish, get the xray and if it is normal you may stop the antibiotic once we contact you with normal result.  It was nice to meet you today. I hope he feel better seen. I did also send a cough medication to your pharmacy.  INSTRUCTIONS FOR UPPER RESPIRATORY INFECTION:  -plenty of rest and fluids  -nasal saline wash 2-3 times daily (use prepackaged nasal saline or bottled/distilled water if making your own)   -can use AFRIN nasal spray for drainage and nasal congestion - but do NOT use longer then 3-4 days  -can use tylenol (in no history of liver disease) or ibuprofen (if no history of kidney disease, bowel bleeding or significant heart disease) as directed for aches and sorethroat  -in the winter time, using a humidifier at night is helpful (please follow cleaning instructions)  -if you are taking a cough medication - use only as directed, may also try a teaspoon of honey to coat the throat and throat lozenges.   -for sore throat, salt water gargles can help  -follow up if you have fevers, facial pain, tooth pain, difficulty breathing or are worsening or symptoms persist longer then expected  Upper Respiratory Infection, Adult An upper respiratory infection (URI) is also known as the common cold. It is often caused by a type of germ (virus). Colds are easily spread (contagious). You can pass it to others by kissing, coughing, sneezing, or drinking out of the same glass. Usually, you get better in 1 to 3  weeks.  However, the cough can last for even longer. HOME CARE   Only take medicine as told by your doctor. Follow instructions provided above.  Drink enough water and fluids to keep your pee (urine) clear or pale yellow.  Get plenty of rest.  Return to work when your temperature is < 100 for 24 hours or as told by your doctor. You may use a face mask and wash your hands to stop your cold from spreading. GET  HELP RIGHT AWAY IF:   After the first few days, you feel you are getting worse.  You have questions about your medicine.  You have chills, shortness of breath, or red spit (mucus).  You have pain in the face for more then 1-2 days, especially when you bend forward.  You have a fever, puffy (swollen) neck, pain when you swallow, or white spots in the back of your throat.  You have a bad headache, ear pain, sinus pain, or chest pain.  You have a high-pitched whistling sound when you breathe in and out (wheezing).  You cough up blood.  You have sore muscles or a stiff neck. MAKE SURE YOU:   Understand these instructions.  Will watch your condition.  Will get help right away if you are not doing well or get worse. Document Released: 02/13/2008 Document Revised: 11/19/2011 Document Reviewed: 12/02/2013 [facility] Patient Information 2015 [place], [place]. This information is not intended to replace advice given to you by your health care provider. Make sure you discuss any questions you have with your health care provider.

## 2016-05-30 ENCOUNTER — Ambulatory Visit (INDEPENDENT_AMBULATORY_CARE_PROVIDER_SITE_OTHER)
Admission: RE | Admit: 2016-05-30 | Discharge: 2016-05-30 | Disposition: A | Discharge status: Home / Self Care | Payer: Medicare Other | Source: Ambulatory Visit | Attending: Family Medicine | Admitting: Family Medicine

## 2016-05-30 DIAGNOSIS — R05 Cough: Secondary | ICD-10-CM | POA: Diagnosis not present

## 2016-05-30 DIAGNOSIS — J069 Acute upper respiratory infection, unspecified: Secondary | ICD-10-CM | POA: Diagnosis not present

## 2016-05-30 DIAGNOSIS — R0602 Shortness of breath: Secondary | ICD-10-CM | POA: Diagnosis not present

## 2016-06-01 ENCOUNTER — Telehealth: Payer: Self-pay | Admitting: Family Medicine

## 2016-06-01 MED ORDER — HYDROCODONE-HOMATROPINE 5-1.5 MG/5ML PO SYRP: 5.0000 mL | ORAL_SOLUTION | ORAL | 0 refills | Status: DC | PRN

## 2016-06-01 NOTE — Telephone Encounter (Signed)
The rx is ready to pick up  

## 2016-06-01 NOTE — Telephone Encounter (Signed)
Script is ready for pick up here at front office and I spoke with pt.  

## 2016-06-01 NOTE — Telephone Encounter (Signed)
-----   Message from Agnes Lawrence, [place] sent at 05/31/2016  5:06 PM EDT ----- I called the pt and informed her of the results.  Patient stated she still has a bad cough and the Benzonatate is not helping her cough at all and asked that Dr Sarajane Jews sent something in to help her?  Message sent to Dr Sarajane Jews and the pt is aware this will be addressed on Friday.

## 2016-07-27 ENCOUNTER — Telehealth: Payer: Self-pay | Admitting: Family Medicine

## 2016-07-27 NOTE — Telephone Encounter (Signed)
Refill request for Zolpidem Tartrate 10 mg and a 90 day supply to [facility].

## 2016-07-30 NOTE — Telephone Encounter (Signed)
Call in #90 with one rf 

## 2016-07-31 MED ORDER — ZOLPIDEM TARTRATE 10 MG PO TABS: ORAL_TABLET | ORAL | 1 refills | Status: DC

## 2016-07-31 NOTE — Telephone Encounter (Signed)
I called in script to [facility].

## 2016-08-23 ENCOUNTER — Other Ambulatory Visit: Payer: Self-pay | Admitting: Family Medicine

## 2016-09-13 ENCOUNTER — Telehealth: Payer: Self-pay | Admitting: Family Medicine

## 2016-09-13 NOTE — Telephone Encounter (Signed)
Pt would like to have a shingle and PNA vaccine is it okay for the pt to be scheduled?

## 2016-09-13 NOTE — Telephone Encounter (Signed)
Please make sure patient checked with insurance that is is okay to have Shingle vaccine before scheduling. If did and covered can schedule.

## 2016-10-22 ENCOUNTER — Other Ambulatory Visit: Payer: Self-pay | Admitting: Family Medicine

## 2016-10-22 NOTE — Telephone Encounter (Signed)
Can we refill this? 

## 2016-10-24 ENCOUNTER — Other Ambulatory Visit: Payer: Self-pay | Admitting: Gynecology

## 2016-10-25 DIAGNOSIS — Z23 Encounter for immunization: Secondary | ICD-10-CM | POA: Diagnosis not present

## 2016-12-05 ENCOUNTER — Other Ambulatory Visit: Payer: Self-pay | Admitting: Family Medicine

## 2017-01-19 ENCOUNTER — Other Ambulatory Visit: Payer: Self-pay | Admitting: Gynecology

## 2017-01-23 ENCOUNTER — Encounter: Payer: Self-pay | Admitting: Gynecology

## 2017-01-29 ENCOUNTER — Encounter: Payer: Self-pay | Admitting: Gynecology

## 2017-01-29 ENCOUNTER — Ambulatory Visit (INDEPENDENT_AMBULATORY_CARE_PROVIDER_SITE_OTHER): Payer: Medicare Other | Admitting: Gynecology

## 2017-01-29 VITALS — BP 120/78 | Ht 66.0 in | Wt 166.0 lb

## 2017-01-29 DIAGNOSIS — Z87410 Personal history of cervical dysplasia: Secondary | ICD-10-CM | POA: Diagnosis not present

## 2017-01-29 DIAGNOSIS — Z79890 Hormone replacement therapy (postmenopausal): Secondary | ICD-10-CM

## 2017-01-29 DIAGNOSIS — Z780 Asymptomatic menopausal state: Secondary | ICD-10-CM

## 2017-01-29 DIAGNOSIS — Z01419 Encounter for gynecological examination (general) (routine) without abnormal findings: Secondary | ICD-10-CM | POA: Diagnosis not present

## 2017-01-29 NOTE — Addendum Note (Signed)
Addended by: Nelva Nay on: 01/29/2017 09:34 AM   Modules accepted: Orders

## 2017-01-29 NOTE — Patient Instructions (Signed)
Shingles Vaccine: What You Need to Know 1. What is shingles? Shingles is a painful skin rash, often with blisters. It is also called Herpes Zoster, or just Zoster. A shingles rash usually appears on one side of the face or body and lasts from 2 to 4 weeks. Its main symptom is pain, which can be quite severe. Other symptoms of shingles can include fever, headache, chills and upset stomach. Very rarely, a shingles infection can lead to pneumonia, hearing problems, blindness, brain inflammation (encephalitis) or death. For about 1 person in 5, severe pain can continue even long after the rash clears up. This is called post-herpetic neuralgia. Shingles is caused by the Varicella Zoster virus, the same virus that causes chickenpox. Only someone who has had chickenpox-or, rarely, has gotten chickenpox vaccine-can get shingles. The virus stays in your body, and can cause shingles many years later. You can't catch shingles from another person with shingles. However, a person who has never had chickenpox (or chicken pox vaccine) could get chickenpox from someone with shingles. This is not very common. Shingles is far more common in people 47 years of age and older than in younger people. It is also more common in people whose immune systems are weakened because of a disease such as cancer, or drugs such as steroids or chemotherapy. At least 1 million people a year in the [place] get shingles. 2. Shingles vaccine A vaccine for shingles was licensed in 9563. In clinical trials, the vaccine reduced the risk of shingles by 50%. It can also reduce pain in people who still get shingles after being vaccinated. A single dose of shingles vaccine is recommended for adults 58 years of age and older. 3. Some people should not get shingles vaccine or should wait A person should not get shingles vaccine who:  has ever had a life-threatening allergic reaction to gelatin, the antibiotic neomycin, or any other component  of shingles vaccine. Tell your doctor if you have any severe allergies.  has a weakened immune system because of current:  AIDS or another disease that affects the immune system,  treatment with drugs that affect the immune system, such as prolonged use of high-dose steroids,  cancer treatment such as radiation or chemotherapy,  cancer affecting the bone marrow or lymphatic system, such as leukemia or lymphoma.  is pregnant, or might be pregnant. Women should not become pregnant until at least 4 weeks after getting shingles vaccine. Someone with a minor acute illness, such as a cold, may be vaccinated. But anyone with a moderate or severe acute illness should usually wait until they recover before get ting the vaccine. This includes anyone with a temperature of 101.3 F or higher. 4. What are the risks from shingles vaccine? A vaccine, like any medicine, could possibly cause serious problems, such as severe allergic reactions. However, the risk of a vaccine causing serious harm, or death, is extremely small. No serious problems have been identified with shingles vaccine. Mild problems   Redness, soreness, swelling, or itching at the site of the injection (about 1 person in 3).  Headache (about 1 person in 97). Like all vaccines, shingles vaccine is being closely monitored for unusual or severe problems. 5. What if there is a serious reaction? What should I look for?   Look for anything that concerns you, such as signs of a severe allergic reaction, very high fever, or behavior changes. Signs of a severe allergic reaction can include hives, swelling of the face and throat,  difficulty breathing, a fast heartbeat, dizziness, and weakness. These would start a few minutes to a few hours after the vaccination. What should I do?   If you think it is a severe allergic reaction or other emergency that can't wait, call 9-1-1 or get the person to the nearest hospital. Otherwise, call your  doctor.  Afterward, the reaction should be reported to the Vaccine Adverse Event Reporting System (VAERS). Your doctor might file this report, or you can do it yourself through the VAERS web site at www.vaers.SamedayNews.es or by calling 813-222-0069. VAERS is only for reporting reactions. They do not give medical advice.  6. How can I learn more?  Ask your doctor.  Call your local or state health department.  Contact the Centers for Disease Control and Prevention (CDC):  Call (347) 283-1029(1-800-CDC-INFO) or  Visit CDC's website at http://hunter.com/ CDC Vaccine Information Statement (VIS) Shingles Vaccine (06/15/2008) This information is not intended to replace advice given to you by your health care provider. Make sure you discuss any questions you have with your health care provider. Document Released: 06/24/2006 Document Revised: 07/21/2016 Document Reviewed: 07/21/2016 Elsevier Interactive Patient Education  2017 [place]. Bone Densitometry Bone densitometry is an imaging test that uses a special X-ray to measure the amount of calcium and other minerals in your bones (bone density). This test is also known as a bone mineral density test or dual-energy X-ray absorptiometry (DXA). The test can measure bone density at your hip and your spine. It is similar to having a regular X-ray. You may have this test to:  Diagnose a condition that causes weak or thin bones (osteoporosis).  Predict your risk of a broken bone (fracture).  Determine how well osteoporosis treatment is working. Tell a health care provider about:  Any allergies you have.  All medicines you are taking, including vitamins, herbs, eye drops, creams, and over-the-counter medicines.  Any problems you or family members have had with anesthetic medicines.  Any blood disorders you have.  Any surgeries you have had.  Any medical conditions you have.  Possibility of pregnancy.  Any other medical test you had  within the previous 14 days that used contrast material. What are the risks? Generally, this is a safe procedure. However, problems can occur and may include the following:  This test exposes you to a very small amount of radiation.  The risks of radiation exposure may be greater to unborn children. What happens before the procedure?  Do not take any calcium supplements for 24 hours before having the test. You can otherwise eat and drink what you usually do.  Take off all metal jewelry, eyeglasses, dental appliances, and any other metal objects. What happens during the procedure?  You may lie on an exam table. There will be an X-ray generator below you and an imaging device above you.  Other devices, such as boxes or braces, may be used to position your body properly for the scan.  You will need to lie still while the machine slowly scans your body.  The images will show up on a computer monitor. What happens after the procedure? You may need more testing at a later time. This information is not intended to replace advice given to you by your health care provider. Make sure you discuss any questions you have with your health care provider. Document Released: 09/18/2004 Document Revised: 02/02/2016 Document Reviewed: 02/04/2014 Elsevier Interactive Patient Education  2017 [employer].

## 2017-01-29 NOTE — Progress Notes (Signed)
Melody Sutton December 28, 1950 [id number]   History:    66 y.o.  for annual gyn exam with no complaints today. Patient is currently on transdermal estrogen Vivelle-Dot 0.075 which she applies twice weekly. On numerous occasions she has attempted to come off the estrogen patch and suffers from severe menopausal symptoms. We had discussed the guidelines she stated years since she's been on the medication but assumes responsibility and accepts the risk.  Her PCP is Dr. Sharlene Motts who has been doing her blood work and she scheduled to see him next month. Patient has not had the shingles vaccine yet. And her last bone density study in 2013 was normal. She did have a colonoscopy in 2015 and benign colon polyps were removed today she is on a 5 year recall.  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 Follow up pap smears have been normal.   Past medical history,surgical history, family history and social history were all reviewed and documented in the EPIC chart.  Gynecologic History Patient's last menstrual period was 09/10/1978. Contraception: status post hysterectomy Last Pap: 2017. Results were: normal Last mammogram: 2017. Results were: Normal had three-dimensional mammogram as a result of dense breasts  Obstetric History OB History  Gravida Para Term Preterm AB Living  1       1    SAB TAB Ectopic Multiple Live Births               # Outcome Date GA Lbr Len/2nd Weight Sex Delivery Anes PTL Lv  1 AB                ROS: A ROS was performed and pertinent positives and negatives are included in the history.  GENERAL: No fevers or chills. HEENT: No change in vision, no earache, sore throat or sinus congestion. NECK: No pain or stiffness. CARDIOVASCULAR: No chest pain or pressure. No palpitations. PULMONARY: No shortness of breath, cough or wheeze. GASTROINTESTINAL: No abdominal pain, nausea, vomiting or diarrhea, melena or bright red blood per rectum. GENITOURINARY: No urinary  frequency, urgency, hesitancy or dysuria. MUSCULOSKELETAL: No joint or muscle pain, no back pain, no recent trauma. DERMATOLOGIC: No rash, no itching, no lesions. ENDOCRINE: No polyuria, polydipsia, no heat or cold intolerance. No recent change in weight. HEMATOLOGICAL: No anemia or easy bruising or bleeding. NEUROLOGIC: No headache, seizures, numbness, tingling or weakness. PSYCHIATRIC: No depression, no loss of interest in normal activity or change in sleep pattern.     Exam: chaperone present  BP 120/78   Ht 5\' 6"  (1.676 m)   Wt 166 lb (75.3 kg)   LMP 09/10/1978   BMI 26.79 kg/m   Body mass index is 26.79 kg/m.  General appearance : Well developed well nourished female. No acute distress HEENT: Eyes: no retinal hemorrhage or exudates,  Neck supple, trachea midline, no carotid bruits, no thyroidmegaly Lungs: Clear to auscultation, no rhonchi or wheezes, or rib retractions  Heart: Regular rate and rhythm, no murmurs or gallops Breast:Examined in sitting and supine position were symmetrical in appearance, no palpable masses or tenderness,  no skin retraction, no nipple inversion, no nipple discharge, no skin discoloration, no axillary or supraclavicular lymphadenopathy Abdomen: no palpable masses or tenderness, no rebound or guarding Extremities: no edema or skin discoloration or tenderness  Pelvic:  Bartholin, Urethra, Skene Glands: Within normal limits             Vagina: No gross lesions or discharge  Cervix: Absent  Uterus  absent  Adnexa  Without masses or tenderness  Anus and perineum  normal   Rectovaginal  normal sphincter tone without palpated masses or tenderness             Hemoccult PCP provides     Assessment/Plan:  66 y.o. female for annual exam was counseled once again on the detrimental effects on long-term estrogen usage to include DVT, pulmonary embolism, and breast cancer. Patient fully understands the risk and is currently on Vivelle-Dot 0.075 mg which she  applies twice a week. We discussed importance of calcium vitamin D and weightbearing exercises for osteoporosis prevention. She scheduled to see her PCP next month husband schedule her bone density study which she is overdue. We discussed importance of monthly breast exams. Because of her past history of CIN-3 we'll continue to do yearly Pap smear was done this year. I provided with prescription for shingles vaccine.   Terrance Mass MD, 8:46 AM 01/29/2017

## 2017-01-30 LAB — PAP IG W/ RFLX HPV ASCU

## 2017-02-20 DIAGNOSIS — L84 Corns and callosities: Secondary | ICD-10-CM | POA: Diagnosis not present

## 2017-02-20 DIAGNOSIS — Z1231 Encounter for screening mammogram for malignant neoplasm of breast: Secondary | ICD-10-CM | POA: Diagnosis not present

## 2017-02-20 DIAGNOSIS — L821 Other seborrheic keratosis: Secondary | ICD-10-CM | POA: Diagnosis not present

## 2017-02-20 DIAGNOSIS — D229 Melanocytic nevi, unspecified: Secondary | ICD-10-CM | POA: Diagnosis not present

## 2017-02-20 LAB — HM MAMMOGRAPHY

## 2017-02-21 ENCOUNTER — Encounter: Payer: Self-pay | Admitting: Family Medicine

## 2017-02-27 ENCOUNTER — Other Ambulatory Visit: Payer: Self-pay | Admitting: Family Medicine

## 2017-02-27 ENCOUNTER — Other Ambulatory Visit: Payer: Self-pay | Admitting: Gynecology

## 2017-02-27 NOTE — Telephone Encounter (Signed)
Call in #90 with one rf 

## 2017-02-28 ENCOUNTER — Encounter: Payer: Self-pay | Admitting: Gynecology

## 2017-02-28 DIAGNOSIS — Z803 Family history of malignant neoplasm of breast: Secondary | ICD-10-CM | POA: Diagnosis not present

## 2017-02-28 DIAGNOSIS — R922 Inconclusive mammogram: Secondary | ICD-10-CM | POA: Diagnosis not present

## 2017-03-04 ENCOUNTER — Other Ambulatory Visit: Payer: Self-pay | Admitting: Family Medicine

## 2017-04-02 ENCOUNTER — Encounter: Payer: Self-pay | Admitting: Family Medicine

## 2017-04-02 ENCOUNTER — Ambulatory Visit (INDEPENDENT_AMBULATORY_CARE_PROVIDER_SITE_OTHER): Payer: Medicare Other | Admitting: Family Medicine

## 2017-04-02 VITALS — BP 150/90 | HR 71 | Temp 97.8°F | Ht 66.0 in | Wt 162.0 lb

## 2017-04-02 DIAGNOSIS — E782 Mixed hyperlipidemia: Secondary | ICD-10-CM

## 2017-04-02 DIAGNOSIS — F5101 Primary insomnia: Secondary | ICD-10-CM | POA: Diagnosis not present

## 2017-04-02 DIAGNOSIS — I10 Essential (primary) hypertension: Secondary | ICD-10-CM | POA: Diagnosis not present

## 2017-04-02 DIAGNOSIS — M810 Age-related osteoporosis without current pathological fracture: Secondary | ICD-10-CM

## 2017-04-02 LAB — LIPID PANEL
CHOL/HDL RATIO: 2
Cholesterol: 173 mg/dL (ref 0–200)
HDL: 80.4 mg/dL (ref >39.00)
LDL Cholesterol: 76 mg/dL (ref 0–99)
NONHDL: 92.16
Triglycerides: 81 mg/dL (ref 0.0–149.0)
VLDL: 16.2 mg/dL (ref 0.0–40.0)

## 2017-04-02 LAB — BASIC METABOLIC PANEL
BUN: 11 mg/dL (ref 6–23)
CHLORIDE: 102 meq/L (ref 96–112)
CO2: 30 meq/L (ref 19–32)
CREATININE: 0.65 mg/dL (ref 0.40–1.20)
Calcium: 9.6 mg/dL (ref 8.4–10.5)
GFR: 96.67 mL/min (ref >60.00)
Glucose, Bld: 101 mg/dL — ABNORMAL HIGH (ref 70–99)
POTASSIUM: 3.8 meq/L (ref 3.5–5.1)
SODIUM: 139 meq/L (ref 135–145)

## 2017-04-02 LAB — POC URINALSYSI DIPSTICK (AUTOMATED)
Bilirubin, UA: NEGATIVE
Blood, UA: NEGATIVE
CLARITY UA: NEGATIVE
GLUCOSE UA: NEGATIVE
LEUKOCYTES UA: NEGATIVE
Nitrite, UA: NEGATIVE
PROTEIN UA: NEGATIVE
Spec Grav, UA: 1.01 (ref 1.010–1.025)
Urobilinogen, UA: 0.2 E.U./dL
pH, UA: 7.5 (ref 5.0–8.0)

## 2017-04-02 LAB — CBC WITH DIFFERENTIAL/PLATELET
BASOS PCT: 0.9 % (ref 0.0–3.0)
Basophils Absolute: 0.1 10*3/uL (ref 0.0–0.1)
Eosinophils Absolute: 0.2 10*3/uL (ref 0.0–0.7)
Eosinophils Relative: 2.8 % (ref 0.0–5.0)
HCT: 38.1 % (ref 36.0–46.0)
HEMOGLOBIN: 12.6 g/dL (ref 12.0–15.0)
LYMPHS ABS: 1.7 10*3/uL (ref 0.7–4.0)
Lymphocytes Relative: 20.7 % (ref 12.0–46.0)
MCHC: 33 g/dL (ref 30.0–36.0)
MCV: 88.9 fl (ref 78.0–100.0)
MONOS PCT: 7.9 % (ref 3.0–12.0)
Monocytes Absolute: 0.6 10*3/uL (ref 0.1–1.0)
Neutro Abs: 5.5 10*3/uL (ref 1.4–7.7)
Neutrophils Relative %: 67.7 % (ref 43.0–77.0)
Platelets: 240 10*3/uL (ref 150.0–400.0)
RBC: 4.29 Mil/uL (ref 3.87–5.11)
RDW: 14.2 % (ref 11.5–15.5)
WBC: 8.1 10*3/uL (ref 4.0–10.5)

## 2017-04-02 LAB — HEPATIC FUNCTION PANEL
ALK PHOS: 56 U/L (ref 39–117)
ALT: 20 U/L (ref 0–35)
AST: 22 U/L (ref 0–37)
Albumin: 4 g/dL (ref 3.5–5.2)
BILIRUBIN DIRECT: 0.1 mg/dL (ref 0.0–0.3)
BILIRUBIN TOTAL: 0.7 mg/dL (ref 0.2–1.2)
TOTAL PROTEIN: 6.3 g/dL (ref 6.0–8.3)

## 2017-04-02 LAB — TSH: TSH: 1.12 u[IU]/mL (ref 0.35–4.50)

## 2017-04-02 NOTE — Progress Notes (Signed)
   Subjective:    Patient ID: Melody Sutton, female    DOB: [birth year], 66 y.o.   MRN: [id number]  HPI Here to follow up on several issues. Her BP had been stable but it has gone up over the past few months. She gained about 10 lbs of weight, and her BP at home has risen to the 140s over 90s. Otherwise she feels well.    Review of Systems  Constitutional: Negative.   HENT: Negative.   Eyes: Negative.   Respiratory: Negative.   Cardiovascular: Negative.   Gastrointestinal: Negative.   Genitourinary: Negative for decreased urine volume, difficulty urinating, dyspareunia, dysuria, enuresis, flank pain, frequency, hematuria, pelvic pain and urgency.  Musculoskeletal: Negative.   Skin: Negative.   Neurological: Negative.   Psychiatric/Behavioral: Negative.        Objective:   Physical Exam  Constitutional: She is oriented to person, place, and time. She appears well-developed and well-nourished. No distress.  HENT:  Head: Normocephalic and atraumatic.  Right Ear: External ear normal.  Left Ear: External ear normal.  Nose: Nose normal.  Mouth/Throat: Oropharynx is clear and moist. No oropharyngeal exudate.  Eyes: Pupils are equal, round, and reactive to light. Conjunctivae and EOM are normal. No scleral icterus.  Neck: Normal range of motion. Neck supple. No JVD present. No thyromegaly present.  Cardiovascular: Normal rate, regular rhythm, normal heart sounds and intact distal pulses.  Exam reveals no gallop and no friction rub.   No murmur heard. Pulmonary/Chest: Effort normal and breath sounds normal. No respiratory distress. She has no wheezes. She has no rales. She exhibits no tenderness.  Abdominal: Soft. Bowel sounds are normal. She exhibits no distension and no mass. There is no tenderness. There is no rebound and no guarding.  Musculoskeletal: Normal range of motion. She exhibits no edema or tenderness.  Lymphadenopathy:    She has no cervical adenopathy.  Neurological:  She is alert and oriented to person, place, and time. She has normal reflexes. No cranial nerve deficit. She exhibits normal muscle tone. Coordination normal.  Skin: Skin is warm and dry. No rash noted. No erythema.  Psychiatric: She has a normal mood and affect. Her behavior is normal. Judgment and thought content normal.          Assessment & Plan:  Her HTN is a little out of control but she is working on her diet and on losing some weight. Recheck in 90 days. We will get fasting labs today to check her lipids, etc. Set up a DEXA soon. She is sleeping well.  Alysia Penna, MD

## 2017-04-02 NOTE — Patient Instructions (Signed)
WE NOW OFFER   [facility] FAST TRACK!!!  SAME DAY Appointments for ACUTE CARE  Such as: Sprains, Injuries, cuts, abrasions, rashes, muscle pain, joint pain, back pain Colds, flu, sore throats, headache, allergies, cough, fever  Ear pain, sinus and eye infections Abdominal pain, nausea, vomiting, diarrhea, upset stomach Animal/insect bites  3 Easy Ways to Schedule: Walk-In Scheduling Call in scheduling Mychart Sign-up: [email or web address]         

## 2017-04-11 ENCOUNTER — Ambulatory Visit (INDEPENDENT_AMBULATORY_CARE_PROVIDER_SITE_OTHER)
Admission: RE | Admit: 2017-04-11 | Discharge: 2017-04-11 | Disposition: A | Discharge status: Home / Self Care | Payer: Medicare Other | Source: Ambulatory Visit | Attending: Family Medicine | Admitting: Family Medicine

## 2017-04-11 DIAGNOSIS — M810 Age-related osteoporosis without current pathological fracture: Secondary | ICD-10-CM | POA: Diagnosis not present

## 2017-04-22 ENCOUNTER — Telehealth: Payer: Self-pay | Admitting: Family Medicine

## 2017-04-22 NOTE — Telephone Encounter (Addendum)
Pt needs new rxs for all maintenance meds #90 each w/refills   to [facility]

## 2017-04-23 NOTE — Telephone Encounter (Signed)
I left a voice message for pt to return call, I really need to know which medications she needs.

## 2017-04-24 NOTE — Telephone Encounter (Signed)
Pt needs 90 days w/refills on atorvastatin, atenolol-chlor, meloxicam, potassium 20 meq and zolpidem 10 mg send to [facility]

## 2017-04-24 NOTE — Telephone Encounter (Signed)
Yes refill all these meds

## 2017-04-24 NOTE — Telephone Encounter (Signed)
I left another voice message for pt to return my call.  

## 2017-04-24 NOTE — Telephone Encounter (Signed)
Can we refill requested scripts?

## 2017-04-25 MED ORDER — POTASSIUM CHLORIDE CRYS ER 20 MEQ PO TBCR: 20.0000 meq | EXTENDED_RELEASE_TABLET | Freq: Every day | ORAL | 3 refills | Status: DC

## 2017-04-25 MED ORDER — MELOXICAM 15 MG PO TABS: 15.0000 mg | ORAL_TABLET | Freq: Every day | ORAL | 3 refills | Status: DC

## 2017-04-25 MED ORDER — ATENOLOL-CHLORTHALIDONE 50-25 MG PO TABS: 1.0000 | ORAL_TABLET | Freq: Every day | ORAL | 3 refills | Status: DC

## 2017-04-25 MED ORDER — ATORVASTATIN CALCIUM 40 MG PO TABS: ORAL_TABLET | ORAL | 3 refills | Status: DC

## 2017-04-25 MED ORDER — ZOLPIDEM TARTRATE 10 MG PO TABS: ORAL_TABLET | ORAL | 3 refills | Status: DC

## 2017-04-25 NOTE — Telephone Encounter (Signed)
I sent all scripts to [facility] mail order.

## 2017-05-30 ENCOUNTER — Other Ambulatory Visit: Payer: Self-pay | Admitting: Family Medicine

## 2017-05-30 ENCOUNTER — Encounter: Payer: Self-pay | Admitting: Family Medicine

## 2017-05-31 NOTE — Telephone Encounter (Signed)
Call in #90 with 3 rf  

## 2017-07-09 ENCOUNTER — Other Ambulatory Visit: Payer: Self-pay | Admitting: Family Medicine

## 2017-07-19 ENCOUNTER — Encounter: Payer: Self-pay | Admitting: Family Medicine

## 2017-07-19 ENCOUNTER — Ambulatory Visit (INDEPENDENT_AMBULATORY_CARE_PROVIDER_SITE_OTHER): Payer: Medicare Other | Admitting: Family Medicine

## 2017-07-19 VITALS — Temp 97.8°F

## 2017-07-19 DIAGNOSIS — J018 Other acute sinusitis: Secondary | ICD-10-CM | POA: Diagnosis not present

## 2017-07-19 MED ORDER — HYDROCODONE-HOMATROPINE 5-1.5 MG/5ML PO SYRP: 5.0000 mL | ORAL_SOLUTION | ORAL | 0 refills | Status: DC | PRN

## 2017-07-19 MED ORDER — AMOXICILLIN-POT CLAVULANATE 875-125 MG PO TABS: 1.0000 | ORAL_TABLET | Freq: Two times a day (BID) | ORAL | 0 refills | Status: DC

## 2017-07-19 NOTE — Progress Notes (Signed)
   Subjective:    Patient ID: Melody Sutton, female    DOB: Jan 31, 1951, 66 y.o.   MRN: [id number]  HPI Here for 5 days of sinus pressure, PND, and a dry cough. On Zycam.   Review of Systems  Constitutional: Negative.   HENT: Positive for congestion, postnasal drip, sinus pressure and sinus pain. Negative for sore throat.   Eyes: Negative.   Respiratory: Positive for cough.        Objective:   Physical Exam  Constitutional: She appears well-developed and well-nourished.  HENT:  Right Ear: External ear normal.  Left Ear: External ear normal.  Nose: Nose normal.  Mouth/Throat: Oropharynx is clear and moist.  Eyes: Conjunctivae are normal.  Neck: No thyromegaly present.  Pulmonary/Chest: Effort normal and breath sounds normal. No respiratory distress. She has no wheezes. She has no rales.  Lymphadenopathy:    She has no cervical adenopathy.          Assessment & Plan:  Sinusitis, treat with Augmentin. Alysia Penna, MD

## 2017-07-19 NOTE — Patient Instructions (Signed)
WE NOW OFFER   [facility] FAST TRACK!!!  SAME DAY Appointments for ACUTE CARE  Such as: Sprains, Injuries, cuts, abrasions, rashes, muscle pain, joint pain, back pain Colds, flu, sore throats, headache, allergies, cough, fever  Ear pain, sinus and eye infections Abdominal pain, nausea, vomiting, diarrhea, upset stomach Animal/insect bites  3 Easy Ways to Schedule: Walk-In Scheduling Call in scheduling Mychart Sign-up: [email or web address]         

## 2017-11-20 ENCOUNTER — Other Ambulatory Visit: Payer: Self-pay | Admitting: Family Medicine

## 2017-11-21 NOTE — Telephone Encounter (Signed)
Last OV 07/19/2017   Last refilled 05/31/2017 disp 90 with 3 refills

## 2018-01-01 ENCOUNTER — Ambulatory Visit: Payer: Medicare Other

## 2018-02-07 ENCOUNTER — Ambulatory Visit (INDEPENDENT_AMBULATORY_CARE_PROVIDER_SITE_OTHER): Payer: Medicare Other

## 2018-02-07 VITALS — BP 144/70 | HR 72 | Ht 65.0 in | Wt 154.6 lb

## 2018-02-07 DIAGNOSIS — Z0000 Encounter for general adult medical examination without abnormal findings: Secondary | ICD-10-CM | POA: Diagnosis not present

## 2018-02-07 NOTE — Patient Instructions (Addendum)
Melody Sutton , Thank you for taking time to come for your Medicare Wellness Visit. I appreciate your ongoing commitment to your health goals. Please review the following plan we discussed and let me know if I can assist you in the future.   Will make a fup apt with Dr. Sarajane Jews to evaluate abdominal bloating; no change in stools  Every day, am to pm   Will make an apt for a vision check   Will make your annual apt with Dr. Sarajane Jews after 7/24 when you leave today You also can schedule your AWV for next year   Please check with [facility] for your immunization hx. You can my chart shelby with this information so we can add it to your record  The Centers for Disease Control are now recommending 2 pneumonia vaccinations after 36. The first is the Prevnar 13. This helps to boost your immunity to community acquired pneumonia as well as some protection from bacterial pneumonia  The 2nd is the pneumovax 23, which offers more broad protection!  Please consider taking these as this is your best protection against pneumonia.  Your bone density T- score was -0.8 which normal  The first shingles vaccine was the Zostervax (live vaccine that was 60 % effective. The new vaccines are 90% effective Shingrix is a vaccine for the prevention of Shingles in Adults 50 and older.  If you are on Medicare, the shingrix is covered under your Part D plan, so you will take both of the vaccines in the series at your pharmacy. Please check with your benefits regarding applicable copays or out of pocket expenses.  The Shingrix is given in 2 vaccines approx 8 weeks apart. You must receive the 2nd dose prior to 6 months from receipt of the first. Please have the pharmacist print out you Immunization  dates for our office records     These are the goals we discussed: Goals    . Patient Stated     To get address the abd bloating issues       This is a list of the screening recommended for you and due dates:  Health  Maintenance  Topic Date Due  . Pneumonia vaccines (1 of 2 - PCV13) 05/18/2015  . Flu Shot  04/10/2018  . Colon Cancer Screening  02/18/2019  . Mammogram  02/21/2019  . Tetanus Vaccine  09/06/2020  . DEXA scan (bone density measurement)  Completed  .  Hepatitis C: One time screening is recommended by Center for Disease Control  (CDC) for  adults born from [birth year] through 1965.   Completed       Fall Prevention in the Home Falls can cause injuries. They can happen to people of all ages. There are many things you can do to make your home safe and to help prevent falls. What can I do on the outside of my home?  Regularly fix the edges of walkways and driveways and fix any cracks.  Remove anything that might make you trip as you walk through a door, such as a raised step or threshold.  Trim any bushes or trees on the path to your home.  Use bright outdoor lighting.  Clear any walking paths of anything that might make someone trip, such as rocks or tools.  Regularly check to see if handrails are loose or broken. Make sure that both sides of any steps have handrails.  Any raised decks and porches should have guardrails on the edges.  Have any  leaves, snow, or ice cleared regularly.  Use sand or salt on walking paths during winter.  Clean up any spills in your garage right away. This includes oil or grease spills. What can I do in the bathroom?  Use night lights.  Install grab bars by the toilet and in the tub and shower. Do not use towel bars as grab bars.  Use non-skid mats or decals in the tub or shower.  If you need to sit down in the shower, use a plastic, non-slip stool.  Keep the floor dry. Clean up any water that spills on the floor as soon as it happens.  Remove soap buildup in the tub or shower regularly.  Attach bath mats securely with double-sided non-slip rug tape.  Do not have throw rugs and other things on the floor that can make you trip. What can I do in  the bedroom?  Use night lights.  Make sure that you have a light by your bed that is easy to reach.  Do not use any sheets or blankets that are too big for your bed. They should not hang down onto the floor.  Have a firm chair that has side arms. You can use this for support while you get dressed.  Do not have throw rugs and other things on the floor that can make you trip. What can I do in the kitchen?  Clean up any spills right away.  Avoid walking on wet floors.  Keep items that you use a lot in easy-to-reach places.  If you need to reach something above you, use a strong step stool that has a grab bar.  Keep electrical cords out of the way.  Do not use floor polish or wax that makes floors slippery. If you must use wax, use non-skid floor wax.  Do not have throw rugs and other things on the floor that can make you trip. What can I do with my stairs?  Do not leave any items on the stairs.  Make sure that there are handrails on both sides of the stairs and use them. Fix handrails that are broken or loose. Make sure that handrails are as long as the stairways.  Check any carpeting to make sure that it is firmly attached to the stairs. Fix any carpet that is loose or worn.  Avoid having throw rugs at the top or bottom of the stairs. If you do have throw rugs, attach them to the floor with carpet tape.  Make sure that you have a light switch at the top of the stairs and the bottom of the stairs. If you do not have them, ask someone to add them for you. What else can I do to help prevent falls?  Wear shoes that: ? Do not have high heels. ? Have rubber bottoms. ? Are comfortable and fit you well. ? Are closed at the toe. Do not wear sandals.  If you use a stepladder: ? Make sure that it is fully opened. Do not climb a closed stepladder. ? Make sure that both sides of the stepladder are locked into place. ? Ask someone to hold it for you, if possible.  Clearly mark and  make sure that you can see: ? Any grab bars or handrails. ? First and last steps. ? Where the edge of each step is.  Use tools that help you move around (mobility aids) if they are needed. These include: ? Canes. ? Walkers. ? Scooters. ? Crutches.  Turn on the lights when you go into a dark area. Replace any light bulbs as soon as they burn out.  Set up your furniture so you have a clear path. Avoid moving your furniture around.  If any of your floors are uneven, fix them.  If there are any pets around you, be aware of where they are.  Review your medicines with your doctor. Some medicines can make you feel dizzy. This can increase your chance of falling. Ask your doctor what other things that you can do to help prevent falls. This information is not intended to replace advice given to you by your health care provider. Make sure you discuss any questions you have with your health care provider. Document Released: 06/23/2009 Document Revised: 02/02/2016 Document Reviewed: 10/01/2014 Elsevier Interactive Patient Education  2018 [place] Maintenance, Female Adopting a healthy lifestyle and getting preventive care can go a long way to promote health and wellness. Talk with your health care provider about what schedule of regular examinations is right for you. This is a good chance for you to check in with your provider about disease prevention and staying healthy. In between checkups, there are plenty of things you can do on your own. Experts have done a lot of research about which lifestyle changes and preventive measures are most likely to keep you healthy. Ask your health care provider for more information. Weight and diet Eat a healthy diet  Be sure to include plenty of vegetables, fruits, low-fat dairy products, and lean protein.  Do not eat a lot of foods high in solid fats, added sugars, or salt.  Get regular exercise. This is one of the most important things you  can do for your health. ? Most adults should exercise for at least 150 minutes each week. The exercise should increase your heart rate and make you sweat (moderate-intensity exercise). ? Most adults should also do strengthening exercises at least twice a week. This is in addition to the moderate-intensity exercise.  Maintain a healthy weight  Body mass index (BMI) is a measurement that can be used to identify possible weight problems. It estimates body fat based on height and weight. Your health care provider can help determine your BMI and help you achieve or maintain a healthy weight.  For females 11 years of age and older: ? A BMI below 18.5 is considered underweight. ? A BMI of 18.5 to 24.9 is normal. ? A BMI of 25 to 29.9 is considered overweight. ? A BMI of 30 and above is considered obese.  Watch levels of cholesterol and blood lipids  You should start having your blood tested for lipids and cholesterol at 68 years of age, then have this test every 5 years.  You may need to have your cholesterol levels checked more often if: ? Your lipid or cholesterol levels are high. ? You are older than 68 years of age. ? You are at high risk for heart disease.  Cancer screening Lung Cancer  Lung cancer screening is recommended for adults 27-71 years old who are at high risk for lung cancer because of a history of smoking.  A yearly low-dose CT scan of the lungs is recommended for people who: ? Currently smoke. ? Have quit within the past 15 years. ? Have at least a 30-pack-year history of smoking. A pack year is smoking an average of one pack of cigarettes a day for 1 year.  Yearly screening should continue until it has  been 15 years since you quit.  Yearly screening should stop if you develop a health problem that would prevent you from having lung cancer treatment.  Breast Cancer  Practice breast self-awareness. This means understanding how your breasts normally appear and  feel.  It also means doing regular breast self-exams. Let your health care provider know about any changes, no matter how small.  If you are in your 20s or 30s, you should have a clinical breast exam (CBE) by a health care provider every 1-3 years as part of a regular health exam.  If you are 43 or older, have a CBE every year. Also consider having a breast X-ray (mammogram) every year.  If you have a family history of breast cancer, talk to your health care provider about genetic screening.  If you are at high risk for breast cancer, talk to your health care provider about having an MRI and a mammogram every year.  Breast cancer gene (BRCA) assessment is recommended for women who have family members with BRCA-related cancers. BRCA-related cancers include: ? Breast. ? Ovarian. ? Tubal. ? Peritoneal cancers.  Results of the assessment will determine the need for genetic counseling and BRCA1 and BRCA2 testing.  Cervical Cancer Your health care provider may recommend that you be screened regularly for cancer of the pelvic organs (ovaries, uterus, and vagina). This screening involves a pelvic examination, including checking for microscopic changes to the surface of your cervix (Pap test). You may be encouraged to have this screening done every 3 years, beginning at age 25.  For women ages 65-65, health care providers may recommend pelvic exams and Pap testing every 3 years, or they may recommend the Pap and pelvic exam, combined with testing for human papilloma virus (HPV), every 5 years. Some types of HPV increase your risk of cervical cancer. Testing for HPV may also be done on women of any age with unclear Pap test results.  Other health care providers may not recommend any screening for nonpregnant women who are considered low risk for pelvic cancer and who do not have symptoms. Ask your health care provider if a screening pelvic exam is right for you.  If you have had past treatment for  cervical cancer or a condition that could lead to cancer, you need Pap tests and screening for cancer for at least 20 years after your treatment. If Pap tests have been discontinued, your risk factors (such as having a new sexual partner) need to be reassessed to determine if screening should resume. Some women have medical problems that increase the chance of getting cervical cancer. In these cases, your health care provider may recommend more frequent screening and Pap tests.  Colorectal Cancer  This type of cancer can be detected and often prevented.  Routine colorectal cancer screening usually begins at 68 years of age and continues through 68 years of age.  Your health care provider may recommend screening at an earlier age if you have risk factors for colon cancer.  Your health care provider may also recommend using home test kits to check for hidden blood in the stool.  A small camera at the end of a tube can be used to examine your colon directly (sigmoidoscopy or colonoscopy). This is done to check for the earliest forms of colorectal cancer.  Routine screening usually begins at age 28.  Direct examination of the colon should be repeated every 5-10 years through 68 years of age. However, you may need to be screened  more often if early forms of precancerous polyps or small growths are found.  Skin Cancer  Check your skin from head to toe regularly.  Tell your health care provider about any new moles or changes in moles, especially if there is a change in a mole's shape or color.  Also tell your health care provider if you have a mole that is larger than the size of a pencil eraser.  Always use sunscreen. Apply sunscreen liberally and repeatedly throughout the day.  Protect yourself by wearing long sleeves, pants, a wide-brimmed hat, and sunglasses whenever you are outside.  Heart disease, diabetes, and high blood pressure  High blood pressure causes heart disease and increases  the risk of stroke. High blood pressure is more likely to develop in: ? People who have blood pressure in the high end of the normal range (130-139/85-89 mm Hg). ? People who are overweight or obese. ? People who are African American.  If you are 77-56 years of age, have your blood pressure checked every 3-5 years. If you are 59 years of age or older, have your blood pressure checked every year. You should have your blood pressure measured twice-once when you are at a hospital or clinic, and once when you are not at a hospital or clinic. Record the average of the two measurements. To check your blood pressure when you are not at a hospital or clinic, you can use: ? An automated blood pressure machine at a pharmacy. ? A home blood pressure monitor.  If you are between 75 years and 49 years old, ask your health care provider if you should take aspirin to prevent strokes.  Have regular diabetes screenings. This involves taking a blood sample to check your fasting blood sugar level. ? If you are at a normal weight and have a low risk for diabetes, have this test once every three years after 68 years of age. ? If you are overweight and have a high risk for diabetes, consider being tested at a younger age or more often. Preventing infection Hepatitis B  If you have a higher risk for hepatitis B, you should be screened for this virus. You are considered at high risk for hepatitis B if: ? You were born in a country where hepatitis B is common. Ask your health care provider which countries are considered high risk. ? Your parents were born in a high-risk country, and you have not been immunized against hepatitis B (hepatitis B vaccine). ? You have HIV or AIDS. ? You use needles to inject street drugs. ? You live with someone who has hepatitis B. ? You have had sex with someone who has hepatitis B. ? You get hemodialysis treatment. ? You take certain medicines for conditions, including cancer, organ  transplantation, and autoimmune conditions.  Hepatitis C  Blood testing is recommended for: ? Everyone born from [birth year] through 1966. ? Anyone with known risk factors for hepatitis C.  Sexually transmitted infections (STIs)  You should be screened for sexually transmitted infections (STIs) including gonorrhea and chlamydia if: ? You are sexually active and are younger than 68 years of age. ? You are older than 68 years of age and your health care provider tells you that you are at risk for this type of infection. ? Your sexual activity has changed since you were last screened and you are at an increased risk for chlamydia or gonorrhea. Ask your health care provider if you are at risk.  If you  do not have HIV, but are at risk, it may be recommended that you take a prescription medicine daily to prevent HIV infection. This is called pre-exposure prophylaxis (PrEP). You are considered at risk if: ? You are sexually active and do not regularly use condoms or know the HIV status of your partner(s). ? You take drugs by injection. ? You are sexually active with a partner who has HIV.  Talk with your health care provider about whether you are at high risk of being infected with HIV. If you choose to begin PrEP, you should first be tested for HIV. You should then be tested every 3 months for as long as you are taking PrEP. Pregnancy  If you are premenopausal and you may become pregnant, ask your health care provider about preconception counseling.  If you may become pregnant, take 400 to 800 micrograms (mcg) of folic acid every day.  If you want to prevent pregnancy, talk to your health care provider about birth control (contraception). Osteoporosis and menopause  Osteoporosis is a disease in which the bones lose minerals and strength with aging. This can result in serious bone fractures. Your risk for osteoporosis can be identified using a bone density scan.  If you are 46 years of age or  older, or if you are at risk for osteoporosis and fractures, ask your health care provider if you should be screened.  Ask your health care provider whether you should take a calcium or vitamin D supplement to lower your risk for osteoporosis.  Menopause may have certain physical symptoms and risks.  Hormone replacement therapy may reduce some of these symptoms and risks. Talk to your health care provider about whether hormone replacement therapy is right for you. Follow these instructions at home:  Schedule regular health, dental, and eye exams.  Stay current with your immunizations.  Do not use any tobacco products including cigarettes, chewing tobacco, or electronic cigarettes.  If you are pregnant, do not drink alcohol.  If you are breastfeeding, limit how much and how often you drink alcohol.  Limit alcohol intake to no more than 1 drink per day for nonpregnant women. One drink equals 12 ounces of beer, 5 ounces of wine, or 1 ounces of hard liquor.  Do not use street drugs.  Do not share needles.  Ask your health care provider for help if you need support or information about quitting drugs.  Tell your health care provider if you often feel depressed.  Tell your health care provider if you have ever been abused or do not feel safe at home. This information is not intended to replace advice given to you by your health care provider. Make sure you discuss any questions you have with your health care provider. Document Released: 03/12/2011 Document Revised: 02/02/2016 Document Reviewed: 05/31/2015 Elsevier Interactive Patient Education  [employer].

## 2018-02-07 NOTE — Progress Notes (Addendum)
Subjective:   Melody Sutton is a 67 y.o. female who presents for an Initial Medicare Annual Wellness Visit.  Reports health as Annual Gyn Dr. 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 PNA vac Low Risk Adult (1 of 2 - PCV13) 05/18/2015   Hepatitis C; gives blood q 57 days  6 times / would have tested for Hep c  Got her first pneumonia at the [employer] And shingles; she will get dates of her vaccination  Colonoscopy 01/2004; note that colonoscopy was in 02/2014 Next one due 02/2009  GYN following:  Mammogram 02/2017  dexa in 2013 and 04/2017 (-0.8) Pap 2016  GYN doctor has retired   Educated regarding shingrix        Objective:    Today's Vitals   02/07/18 1601  BP: (!) 144/70  Pulse: 72  Weight: 154 lb 9 oz (70.1 kg)  Height: 5\' 5"  (1.651 m)   Body mass index is 25.72 kg/m.  Advanced Directives 02/07/2018 02/03/2014  Does Patient Have a Medical Advance Directive? Yes Patient has advance directive, copy not in chart  Does patient want to make changes to medical advance directive? - No change requested  Pre-existing out of facility DNR order (yellow form or pink MOST form) - No    Current Medications (verified) Outpatient Encounter Medications as of 02/07/2018  Medication Sig  . atenolol-chlorthalidone (TENORETIC) 50-25 MG tablet Take 1 tablet by mouth daily.  Marland Kitchen atorvastatin (LIPITOR) 40 MG tablet Take 1 tablet (40 mg total) by mouth daily.  . calcium carbonate (TUMS - DOSED IN MG  ELEMENTAL CALCIUM) 500 MG chewable tablet Chew 1 tablet (200 mg of elemental calcium total) by mouth 2 (two) times daily.  . cholecalciferol (VITAMIN D) 1000 UNITS tablet Take 1 tablet (1,000 Units total) by mouth daily. (Patient taking differently: Take 400 Units by mouth daily. )  . Cyanocobalamin (VITAMIN B 12 PO) Take 1,000 mcg by mouth daily.  Marland Kitchen estradiol (VIVELLE-DOT) 0.075 MG/24HR Place 1 patch onto the skin 2 (two) times a week.  . meloxicam (MOBIC) 15 MG tablet Take 1 tablet (15 mg total) by mouth daily.  Marland Kitchen OVER THE COUNTER MEDICATION [facility] brand 24-hour nondrowsy allergy med daily equivalent to Claritin  . polyethylene glycol powder (GLYCOLAX/MIRALAX) powder Take 17 g by mouth 2 (two) times daily as needed.  . potassium chloride SA (K-DUR,KLOR-CON) 20 MEQ tablet Take 1 tablet (20 mEq total) by mouth daily.  Marland Kitchen pyridOXINE (VITAMIN B-6) 100 MG tablet Take 100 mg by mouth daily.  . vitamin E 1000 UNIT capsule Take 1 capsule (1,000 Units total) by mouth daily. (Patient taking differently: Take 400 Units by mouth daily. )  . zolpidem (AMBIEN) 10 MG tablet TAKE ONE TABLET BY MOUTH AT BEDTIME AS NEEDED  . [DISCONTINUED] amoxicillin-clavulanate (AUGMENTIN) 875-125 MG tablet Take 1 tablet 2 (two) times daily by mouth.  . [DISCONTINUED] HYDROcodone-homatropine (HYDROMET)  5-1.5 MG/5ML syrup Take 5 mLs every 4 (four) hours as needed by mouth. (Patient not taking: Reported on 02/07/2018)   No facility-administered encounter medications on file as of 02/07/2018.     Allergies (verified) Patient has no known allergies.   History: Past Medical History:  Diagnosis Date  . CIN III (cervical intraepithelial neoplasia grade III) with severe dysplasia   . Dysmenorrhea   . Granuloma annulare   . Hypertension   . Ovarian cyst    Past Surgical History:  Procedure Laterality Date  . COLONOSCOPY  02-17-14   per Dr. Olevia Perches, adenomatous polyps, repeat in 5 yrs   . OVARIAN CYST SURGERY    . TUBAL  LIGATION    . VAGINAL HYSTERECTOMY     Family History  Problem Relation Age of Onset  . Hypertension Mother   . Breast cancer Maternal Aunt   . Cancer Maternal Aunt        Vulvar Ca in situ  . Diabetes Maternal Uncle   . Colon cancer Neg Hx   . Pancreatic cancer Neg Hx   . Stomach cancer Neg Hx    Social History   Socioeconomic History  . Marital status: Married    Spouse name: Not on file  . Number of children: Not on file  . Years of education: Not on file  . Highest education level: Not on file  Occupational History  . Not on file  Social Needs  . Financial resource strain: Not on file  . Food insecurity:    Worry: Not on file    Inability: Not on file  . Transportation needs:    Medical: Not on file    Non-medical: Not on file  Tobacco Use  . Smoking status: Former Smoker    Packs/day: 1.00    Years: 32.00    Pack years: 32.00    Last attempt to quit: 06/11/1991    Years since quitting: 26.6  . Smokeless tobacco: Never Used  Substance and Sexual Activity  . Alcohol use: Yes    Alcohol/week: 4.2 oz    Types: 7 Glasses of wine per week    Comment: daily ( 2)   . Drug use: No  . Sexual activity: Yes    Birth control/protection: Surgical  Lifestyle  . Physical activity:    Days per week: Not on file    Minutes per session: Not on file  . Stress: Not on file  Relationships  . Social connections:    Talks on phone: Not on file    Gets together: Not on file    Attends religious service: Not on file    Active member of club or organization: Not on file    Attends meetings of clubs or organizations: Not on file    Relationship status: Not on file  Other Topics Concern  . Not on file  Social History Narrative  . Not on file    Tobacco Counseling Counseling given: Yes   Clinical Intake:   Activities of Daily Living In your present state of health, do you have any difficulty performing the following activities: 02/07/2018  Hearing? N  Vision? N    Difficulty concentrating or making decisions? N  Walking or climbing stairs? N  Dressing or bathing? N  Doing errands, shopping? N  Preparing Food and eating ? N  Using the Toilet? N  In the past six months, have you accidently leaked urine? N  Do you have problems with loss of bowel control?  Y  Managing your Medications? N  Managing your Finances? N  Housekeeping or managing your Housekeeping? N  Some recent data might be hidden     Immunizations and Health Maintenance Immunization History  Administered Date(s) Administered  . Influenza Split 08/03/2011  . Influenza Whole 09/05/2009, 06/09/2010  . Td 09/06/2010   Health Maintenance Due  Topic Date Due  . PNA vac Low Risk Adult (1 of 2 - PCV13) 05/18/2015    Patient Care Team: Laurey Morale, MD as PCP - General  Indicate any recent Medical Services you may have received from other than [facility] providers in the past year (date may be approximate).     Assessment:   This is a routine wellness examination for Melody Sutton.  Hearing/Vision screen  Hearing Screening   125Hz  250Hz  500Hz  1000Hz  2000Hz  3000Hz  4000Hz  6000Hz  8000Hz   Right ear:       100    Left ear:       100    Comments: Hearing issues;  none  Vision Screening Comments: Vision issues Will need to get and eye apt  Dietary issues and exercise activities discussed:    Goals    . Patient Stated     To get address the abd bloating issues      Depression Screen PHQ 2/9 Scores 02/07/2018 03/28/2016  PHQ - 2 Score 0 0    Fall Risk Fall Risk  02/07/2018 03/28/2016  Falls in the past year? No No      Cognitive Function: Ad8 score reviewed for issues:  Issues making decisions:  Less interest in hobbies / activities:  Repeats questions, stories (family complaining):  Trouble using ordinary gadgets (microwave, computer, phone):  Forgets the month or year:   Mismanaging finances:   Remembering appts:  Daily problems with thinking and/or memory: Ad8  score is=0     MMSE - Mini Mental State Exam 02/07/2018  Not completed: (No Data)        Screening Tests Health Maintenance  Topic Date Due  . PNA vac Low Risk Adult (1 of 2 - PCV13) 05/18/2015  . INFLUENZA VACCINE  04/10/2018  . COLONOSCOPY  02/18/2019  . MAMMOGRAM  02/21/2019  . TETANUS/TDAP  09/06/2020  . DEXA SCAN  Completed  . Hepatitis C Screening  Completed        Plan:      PCP Notes   Health Maintenance Will schedule eye exam Will get her IMM record from [facility] to update our records  Educated on bone density -0.8,  Continues to take in Calcium and Vit D ( supplements diet)  Educated regarding shingrix  Abnormal Screens  BP but states her BP is normal and below 130/80 most days. Is coming in to see Dr. Sarajane Jews   Referrals  none  Patient concerns; C/o of bloating after eating; not much gas; starts in the am and continues to the pm; no bleeding;  Will schedule an acute fup with Dr. Sarajane Jews to discuss   Nurse Concerns; As noted Next PCP apt She will make an acute apt as noted Will make her annual apt after 7/25 for labs etc and will schedule AWV at that time     I have personally reviewed and noted the following in the patient's chart:   . Medical and social history . Use of alcohol, tobacco or illicit drugs  . Current medications and supplements . Functional ability and status . Nutritional status . Physical activity . Advanced directives . List of other  physicians . Hospitalizations, surgeries, and ER visits in previous 12 months . Vitals . Screenings to include cognitive, depression, and falls . Referrals and appointments  In addition, I have reviewed and discussed with patient certain preventive protocols, quality metrics, and best practice recommendations. A written personalized care plan for preventive services as well as general preventive health recommendations were provided to patient.     VDIXV,EZBMZ, RN   02/07/2018   I have  read this note and agree with its contents.  Alysia Penna, MD

## 2018-03-03 ENCOUNTER — Telehealth: Payer: Self-pay | Admitting: Family Medicine

## 2018-03-03 NOTE — Telephone Encounter (Signed)
Ambien refill  Last Refill:05/31/17  #90 with 3 refills Last OV: 04/02/17 Next OV: 04/07/18 PCP: Dr. Sarajane Jews Pharmacy: [facility]

## 2018-03-03 NOTE — Telephone Encounter (Signed)
Copied from [place]. Topic: Quick Communication - Rx Refill/Question >> Mar 03, 2018 12:25 PM Waylan Rocher, [place] L wrote: Medication: zolpidem (AMBIEN) 10 MG tablet (0 refills)  Has the patient contacted their pharmacy? Yes.   (Agent: If no, request that the patient contact the pharmacy for the refill.) (Agent: If yes, when and what did the pharmacy advise?)  Preferred Pharmacy (with phone number or street name): [facility], [address] [place] [address] Phone: [phone] Fax: [phone]  Agent: Please be advised that RX refills may take up to 3 business days. We ask that you follow-up with your pharmacy.

## 2018-03-04 NOTE — Telephone Encounter (Signed)
Last refilled 05/31/2017 disp 90 with 3 refills   Pt should have enough until September  Will need to call pt

## 2018-03-04 NOTE — Telephone Encounter (Signed)
Please advise thanks.

## 2018-03-10 NOTE — Telephone Encounter (Signed)
Please advise 

## 2018-03-10 NOTE — Telephone Encounter (Signed)
Patient states that [facility] advised her she did not have any refills on file with them. Please advise.

## 2018-03-10 NOTE — Telephone Encounter (Signed)
Pt has refills through [facility] not [facility] would she like to switch?   Call pt and left a VM CRM created and sent to [facility].

## 2018-03-11 ENCOUNTER — Other Ambulatory Visit: Payer: Self-pay | Admitting: Family Medicine

## 2018-03-11 NOTE — Telephone Encounter (Signed)
Called and spoke with [facility] they stated that the pt does NOT have any Rx's on file.   Pt would like this medication sent to [facility] because the medication is more afordable for her that way   Sent to PCP to advise.

## 2018-03-12 NOTE — Telephone Encounter (Signed)
Call in #90 with one rf. The computer will not let me send this in because it has already been sent to [facility]?

## 2018-03-12 NOTE — Telephone Encounter (Signed)
Rx phoned in to the number below, which per the pharmacist [facility] and [facility] are now one company.

## 2018-04-07 ENCOUNTER — Ambulatory Visit (INDEPENDENT_AMBULATORY_CARE_PROVIDER_SITE_OTHER): Payer: Medicare Other | Admitting: Family Medicine

## 2018-04-07 ENCOUNTER — Encounter: Payer: Self-pay | Admitting: Family Medicine

## 2018-04-07 VITALS — BP 140/82 | HR 75 | Temp 98.2°F | Ht 65.25 in | Wt 151.6 lb

## 2018-04-07 DIAGNOSIS — E782 Mixed hyperlipidemia: Secondary | ICD-10-CM

## 2018-04-07 DIAGNOSIS — B351 Tinea unguium: Secondary | ICD-10-CM

## 2018-04-07 DIAGNOSIS — F5101 Primary insomnia: Secondary | ICD-10-CM | POA: Diagnosis not present

## 2018-04-07 DIAGNOSIS — I10 Essential (primary) hypertension: Secondary | ICD-10-CM | POA: Diagnosis not present

## 2018-04-07 LAB — CBC WITH DIFFERENTIAL/PLATELET
BASOS ABS: 0.1 10*3/uL (ref 0.0–0.1)
BASOS PCT: 0.8 % (ref 0.0–3.0)
EOS ABS: 0.2 10*3/uL (ref 0.0–0.7)
Eosinophils Relative: 3.1 % (ref 0.0–5.0)
HEMATOCRIT: 37.8 % (ref 36.0–46.0)
Hemoglobin: 12.7 g/dL (ref 12.0–15.0)
Lymphocytes Relative: 18.9 % (ref 12.0–46.0)
Lymphs Abs: 1.5 10*3/uL (ref 0.7–4.0)
MCHC: 33.6 g/dL (ref 30.0–36.0)
MCV: 87.6 fl (ref 78.0–100.0)
MONO ABS: 0.6 10*3/uL (ref 0.1–1.0)
Monocytes Relative: 8 % (ref 3.0–12.0)
NEUTROS ABS: 5.3 10*3/uL (ref 1.4–7.7)
NEUTROS PCT: 69.2 % (ref 43.0–77.0)
PLATELETS: 254 10*3/uL (ref 150.0–400.0)
RBC: 4.32 Mil/uL (ref 3.87–5.11)
RDW: 15 % (ref 11.5–15.5)
WBC: 7.7 10*3/uL (ref 4.0–10.5)

## 2018-04-07 LAB — BASIC METABOLIC PANEL
BUN: 9 mg/dL (ref 6–23)
CHLORIDE: 100 meq/L (ref 96–112)
CO2: 32 meq/L (ref 19–32)
Calcium: 9.3 mg/dL (ref 8.4–10.5)
Creatinine, Ser: 0.59 mg/dL (ref 0.40–1.20)
GFR: 107.77 mL/min (ref >60.00)
Glucose, Bld: 98 mg/dL (ref 70–99)
POTASSIUM: 3.7 meq/L (ref 3.5–5.1)
Sodium: 140 mEq/L (ref 135–145)

## 2018-04-07 LAB — LIPID PANEL
CHOL/HDL RATIO: 2
Cholesterol: 187 mg/dL (ref 0–200)
HDL: 83.8 mg/dL (ref >39.00)
LDL Cholesterol: 87 mg/dL (ref 0–99)
NONHDL: 103.5
TRIGLYCERIDES: 83 mg/dL (ref 0.0–149.0)
VLDL: 16.6 mg/dL (ref 0.0–40.0)

## 2018-04-07 LAB — HEPATIC FUNCTION PANEL
ALT: 18 U/L (ref 0–35)
AST: 21 U/L (ref 0–37)
Albumin: 4.1 g/dL (ref 3.5–5.2)
Alkaline Phosphatase: 51 U/L (ref 39–117)
BILIRUBIN DIRECT: 0.1 mg/dL (ref 0.0–0.3)
BILIRUBIN TOTAL: 0.7 mg/dL (ref 0.2–1.2)
TOTAL PROTEIN: 6.5 g/dL (ref 6.0–8.3)

## 2018-04-07 LAB — POC URINALSYSI DIPSTICK (AUTOMATED)
BILIRUBIN UA: NEGATIVE
Blood, UA: POSITIVE
GLUCOSE UA: NEGATIVE
KETONES UA: NEGATIVE
LEUKOCYTES UA: NEGATIVE
Nitrite, UA: NEGATIVE
Protein, UA: POSITIVE — AB
SPEC GRAV UA: 1.01 (ref 1.010–1.025)
Urobilinogen, UA: 0.2 E.U./dL
pH, UA: >=9 — AB (ref 5.0–8.0)

## 2018-04-07 LAB — TSH: TSH: 1.09 u[IU]/mL (ref 0.35–4.50)

## 2018-04-07 MED ORDER — ATORVASTATIN CALCIUM 40 MG PO TABS: ORAL_TABLET | ORAL | 3 refills | Status: DC

## 2018-04-07 MED ORDER — POTASSIUM CHLORIDE CRYS ER 20 MEQ PO TBCR: 20.0000 meq | EXTENDED_RELEASE_TABLET | Freq: Every day | ORAL | 3 refills | Status: DC

## 2018-04-07 MED ORDER — TERBINAFINE HCL 250 MG PO TABS: 250.0000 mg | ORAL_TABLET | Freq: Every day | ORAL | 1 refills | Status: DC

## 2018-04-07 MED ORDER — MELOXICAM 15 MG PO TABS: 15.0000 mg | ORAL_TABLET | Freq: Every day | ORAL | 3 refills | Status: DC

## 2018-04-07 MED ORDER — ZOLPIDEM TARTRATE 10 MG PO TABS: 10.0000 mg | ORAL_TABLET | Freq: Every evening | ORAL | 1 refills | Status: DC | PRN

## 2018-04-07 MED ORDER — ATENOLOL-CHLORTHALIDONE 50-25 MG PO TABS: 1.0000 | ORAL_TABLET | Freq: Every day | ORAL | 3 refills | Status: DC

## 2018-04-07 NOTE — Progress Notes (Signed)
   Subjective:    Patient ID: Melody Sutton, female    DOB: December 31, 1950, 68 y.o.   MRN: [id number]  HPI Here to follow up on issues. She is doing well. She sleeps well. Appetite is good. She was treated for toenail fungus a few years ago but this has come back.   Review of Systems  Constitutional: Negative.   HENT: Negative.   Eyes: Negative.   Respiratory: Negative.   Cardiovascular: Negative.   Gastrointestinal: Negative.   Genitourinary: Negative for decreased urine volume, difficulty urinating, dyspareunia, dysuria, enuresis, flank pain, frequency, hematuria, pelvic pain and urgency.  Musculoskeletal: Negative.   Skin: Negative.   Neurological: Negative.   Psychiatric/Behavioral: Negative.        Objective:   Physical Exam  Constitutional: She is oriented to person, place, and time. She appears well-developed and well-nourished. No distress.  HENT:  Head: Normocephalic and atraumatic.  Right Ear: External ear normal.  Left Ear: External ear normal.  Nose: Nose normal.  Mouth/Throat: Oropharynx is clear and moist. No oropharyngeal exudate.  Eyes: Pupils are equal, round, and reactive to light. Conjunctivae and EOM are normal. No scleral icterus.  Neck: Normal range of motion. Neck supple. No JVD present. No thyromegaly present.  Cardiovascular: Normal rate, regular rhythm, normal heart sounds and intact distal pulses. Exam reveals no gallop and no friction rub.  No murmur heard. Pulmonary/Chest: Effort normal and breath sounds normal. No respiratory distress. She has no wheezes. She has no rales. She exhibits no tenderness.  Abdominal: Soft. Bowel sounds are normal. She exhibits no distension and no mass. There is no tenderness. There is no rebound and no guarding.  Musculoskeletal: Normal range of motion. She exhibits no edema or tenderness.  Lymphadenopathy:    She has no cervical adenopathy.  Neurological: She is alert and oriented to person, place, and time. She has  normal reflexes. She displays normal reflexes. No cranial nerve deficit. She exhibits normal muscle tone. Coordination normal.  Skin: Skin is warm and dry. No rash noted. No erythema.  All 10 toenails show fungal involvement   Psychiatric: She has a normal mood and affect. Her behavior is normal. Judgment and thought content normal.          Assessment & Plan:  Her HTN is stable. Treat the toenail fungus with Terbinafine. Get fasting labs for lipids, etc. Refilled medications.  Alysia Penna, MD

## 2018-04-07 NOTE — Addendum Note (Signed)
Addended by: Alysia Penna A on: 04/07/2018 09:40 AM   Modules accepted: Orders

## 2018-04-08 ENCOUNTER — Telehealth: Payer: Self-pay | Admitting: *Deleted

## 2018-04-08 NOTE — Telephone Encounter (Signed)
Patient called requesting refill on medication (did not leave name of medication) I called patient and left message on voicemail she is overdue for annual exam and to confirm Rx so Rx can be sent to pharmacy.

## 2018-04-14 MED ORDER — ESTRADIOL 0.075 MG/24HR TD PTTW: 1.0000 | MEDICATED_PATCH | TRANSDERMAL | 1 refills | Status: DC

## 2018-04-14 NOTE — Telephone Encounter (Signed)
patient called back needs refill on vivelle dot patch 0.075 mg, I sent Rx to [facility]. Left on voicemail Rx sent.

## 2018-04-14 NOTE — Addendum Note (Signed)
Addended by: Thamas Jaegers on: 04/14/2018 03:41 PM   Modules accepted: Orders

## 2018-07-24 ENCOUNTER — Ambulatory Visit (INDEPENDENT_AMBULATORY_CARE_PROVIDER_SITE_OTHER): Payer: Medicare Other | Admitting: *Deleted

## 2018-07-24 DIAGNOSIS — Z23 Encounter for immunization: Secondary | ICD-10-CM

## 2018-07-28 ENCOUNTER — Ambulatory Visit (INDEPENDENT_AMBULATORY_CARE_PROVIDER_SITE_OTHER): Payer: Medicare Other | Admitting: Obstetrics & Gynecology

## 2018-07-28 ENCOUNTER — Encounter: Payer: Self-pay | Admitting: Obstetrics & Gynecology

## 2018-07-28 VITALS — BP 140/80 | Ht 65.0 in | Wt 147.6 lb

## 2018-07-28 DIAGNOSIS — Z1272 Encounter for screening for malignant neoplasm of vagina: Secondary | ICD-10-CM

## 2018-07-28 DIAGNOSIS — D069 Carcinoma in situ of cervix, unspecified: Secondary | ICD-10-CM

## 2018-07-28 DIAGNOSIS — Z9289 Personal history of other medical treatment: Secondary | ICD-10-CM

## 2018-07-28 DIAGNOSIS — Z79890 Hormone replacement therapy (postmenopausal): Secondary | ICD-10-CM

## 2018-07-28 DIAGNOSIS — Z01419 Encounter for gynecological examination (general) (routine) without abnormal findings: Secondary | ICD-10-CM

## 2018-07-28 DIAGNOSIS — Z90710 Acquired absence of both cervix and uterus: Secondary | ICD-10-CM

## 2018-07-28 MED ORDER — ESTRADIOL 0.05 MG/24HR TD PTTW: 1.0000 | MEDICATED_PATCH | TRANSDERMAL | 4 refills | Status: DC

## 2018-07-28 NOTE — Progress Notes (Signed)
Melody Sutton Jan 07, 1951 [id number]   History:    67 y.o. G0 Married  RP:  Established patient presenting for annual gyn exam   HPI: S/P Vaginal Hysterectomy for CIN 3.  Pap tests normal since.  Menopause syndrome, still on Estradiol patch 0.075 twice weekly.  No pelvic pain.  No pain with intercourse.  Breast normal.  Body mass index 24.56.  Physically active.  Health labs with family physician.  Past medical history,surgical history, family history and social history were all reviewed and documented in the EPIC chart.  Gynecologic History Patient's last menstrual period was 09/10/1978. Contraception: status post hysterectomy Last Pap: 01/2017. Results were: Negative Last mammogram: 02/2017. Results were: Benign Bone Density: 04/2017 normal.  Repeat at 4 yrs Colonoscopy: 2015  Obstetric History OB History  Gravida Para Term Preterm AB Living  0       0 0  SAB TAB Ectopic Multiple Live Births                ROS: A ROS was performed and pertinent positives and negatives are included in the history.  GENERAL: No fevers or chills. HEENT: No change in vision, no earache, sore throat or sinus congestion. NECK: No pain or stiffness. CARDIOVASCULAR: No chest pain or pressure. No palpitations. PULMONARY: No shortness of breath, cough or wheeze. GASTROINTESTINAL: No abdominal pain, nausea, vomiting or diarrhea, melena or bright red blood per rectum. GENITOURINARY: No urinary frequency, urgency, hesitancy or dysuria. MUSCULOSKELETAL: No joint or muscle pain, no back pain, no recent trauma. DERMATOLOGIC: No rash, no itching, no lesions. ENDOCRINE: No polyuria, polydipsia, no heat or cold intolerance. No recent change in weight. HEMATOLOGICAL: No anemia or easy bruising or bleeding. NEUROLOGIC: No headache, seizures, numbness, tingling or weakness. PSYCHIATRIC: No depression, no loss of interest in normal activity or change in sleep pattern.     Exam:   BP 140/80   Ht 5\' 5"  (1.651 m)   Wt  147 lb 9.6 oz (67 kg)   LMP 09/10/1978   BMI 24.56 kg/m   Body mass index is 24.56 kg/m.  General appearance : Well developed well nourished female. No acute distress HEENT: Eyes: no retinal hemorrhage or exudates,  Neck supple, trachea midline, no carotid bruits, no thyroidmegaly Lungs: Clear to auscultation, no rhonchi or wheezes, or rib retractions  Heart: Regular rate and rhythm, no murmurs or gallops Breast:Examined in sitting and supine position were symmetrical in appearance, no palpable masses or tenderness,  no skin retraction, no nipple inversion, no nipple discharge, no skin discoloration, no axillary or supraclavicular lymphadenopathy Abdomen: no palpable masses or tenderness, no rebound or guarding Extremities: no edema or skin discoloration or tenderness  Pelvic: Vulva: Normal             Vagina: No gross lesions or discharge  Cervix/Uterus absent  Adnexa  Without masses or tenderness  Anus: Normal   Assessment/Plan:  67 y.o. female for annual exam   1. Encounter for Papanicolaou smear of vagina as part of routine gynecological examination Gynecologic exam status post hysterectomy in menopause.  Pap test negative in May 2018.  Pap reflex done today because of history of CIN-3.  Breast exam normal.  Schedule screening mammogram now.  Health labs with family physician.  Good body mass index at 24.56.  2. H/O total hysterectomy  3. Severe cervical dysplasia, histologically confirmed Pap reflex done today.  4. Post-menopause on HRT (hormone replacement therapy) Well on estradiol patch 0.05 twice weekly.  No  contraindication to continue.  Represcribed.  Other orders - Calcium-Magnesium-Vitamin D (CALCIUM 1200+D3 PO); Take by mouth. - estradiol (VIVELLE-DOT) 0.05 MG/24HR patch; Place 1 patch (0.05 mg total) onto the skin 2 (two) times a week.  Princess Bruins MD, 11:20 AM 07/28/2018

## 2018-07-30 ENCOUNTER — Encounter: Payer: Self-pay | Admitting: Obstetrics & Gynecology

## 2018-07-30 ENCOUNTER — Encounter: Payer: Self-pay | Admitting: Family Medicine

## 2018-07-30 DIAGNOSIS — Z803 Family history of malignant neoplasm of breast: Secondary | ICD-10-CM | POA: Diagnosis not present

## 2018-07-30 DIAGNOSIS — Z1231 Encounter for screening mammogram for malignant neoplasm of breast: Secondary | ICD-10-CM | POA: Diagnosis not present

## 2018-07-30 LAB — PAP IG W/ RFLX HPV ASCU

## 2018-08-03 ENCOUNTER — Encounter: Payer: Self-pay | Admitting: Obstetrics & Gynecology

## 2018-08-03 NOTE — Patient Instructions (Signed)
1. Encounter for Papanicolaou smear of vagina as part of routine gynecological examination Gynecologic exam status post hysterectomy in menopause.  Pap test negative in May 2018.  Pap reflex done today because of history of CIN-3.  Breast exam normal.  Schedule screening mammogram now.  Health labs with family physician.  Good body mass index at 24.56.  2. H/O total hysterectomy  3. Severe cervical dysplasia, histologically confirmed Pap reflex done today.  4. Post-menopause on HRT (hormone replacement therapy) Well on estradiol patch 0.05 twice weekly.  No contraindication to continue.  Represcribed.  Other orders - Calcium-Magnesium-Vitamin D (CALCIUM 1200+D3 PO); Take by mouth. - estradiol (VIVELLE-DOT) 0.05 MG/24HR patch; Place 1 patch (0.05 mg total) onto the skin 2 (two) times a week.  Antonina, it was a pleasure seeing you today!  I will inform you of your results as soon as they are available.

## 2018-09-25 ENCOUNTER — Other Ambulatory Visit: Payer: Self-pay | Admitting: Obstetrics & Gynecology

## 2018-12-02 ENCOUNTER — Other Ambulatory Visit: Payer: Self-pay | Admitting: Family Medicine

## 2018-12-05 MED ORDER — ZOLPIDEM TARTRATE 10 MG PO TABS: 10.0000 mg | ORAL_TABLET | Freq: Every evening | ORAL | 1 refills | Status: DC | PRN

## 2018-12-05 NOTE — Telephone Encounter (Signed)
Rx phoned in to [facility].

## 2018-12-05 NOTE — Telephone Encounter (Signed)
Last rx given on 7/29 for #90 with 1 ref

## 2018-12-05 NOTE — Telephone Encounter (Signed)
Call in #90 with one rf 

## 2018-12-06 ENCOUNTER — Other Ambulatory Visit: Payer: Self-pay | Admitting: Family Medicine

## 2018-12-06 NOTE — Telephone Encounter (Signed)
Please advise on refill request

## 2019-01-22 ENCOUNTER — Telehealth: Payer: Self-pay | Admitting: Family Medicine

## 2019-01-22 NOTE — Telephone Encounter (Signed)
Called pt and let her know of the msg below lmom for pt to call the office back to get scheduled.

## 2019-01-22 NOTE — Telephone Encounter (Signed)
Hello Melody Sutton can you tell me if this pt is due for PNA she is inquiring again about this vaccine I just need to know if she is due and if I can get her scheduled for this.  The first time it was sent was on 01/16/2019 and again now.

## 2019-01-22 NOTE — Telephone Encounter (Signed)
Called pt and she has been scheduled.

## 2019-01-22 NOTE — Telephone Encounter (Signed)
Good morning.  Yes she is due for the prevnar 13 per Dr. Sarajane Jews.  Thank you!!

## 2019-01-23 ENCOUNTER — Ambulatory Visit (INDEPENDENT_AMBULATORY_CARE_PROVIDER_SITE_OTHER): Payer: Medicare Other | Admitting: *Deleted

## 2019-01-23 ENCOUNTER — Other Ambulatory Visit: Payer: Self-pay

## 2019-01-23 DIAGNOSIS — Z23 Encounter for immunization: Secondary | ICD-10-CM | POA: Diagnosis not present

## 2019-02-09 ENCOUNTER — Telehealth: Payer: Self-pay

## 2019-02-09 NOTE — Telephone Encounter (Signed)
Left message for patient to call back. CRM created.  The patient will nee to reschedule their AWV or see her provider. Health Coach is out of the office until July.

## 2019-02-11 ENCOUNTER — Encounter: Payer: Self-pay | Admitting: Family Medicine

## 2019-02-11 ENCOUNTER — Ambulatory Visit (INDEPENDENT_AMBULATORY_CARE_PROVIDER_SITE_OTHER): Payer: Medicare Other | Admitting: Family Medicine

## 2019-02-11 ENCOUNTER — Other Ambulatory Visit: Payer: Self-pay

## 2019-02-11 ENCOUNTER — Ambulatory Visit: Payer: Medicare Other

## 2019-02-11 VITALS — BP 138/84 | HR 68 | Temp 97.6°F | Wt 146.1 lb

## 2019-02-11 DIAGNOSIS — B351 Tinea unguium: Secondary | ICD-10-CM | POA: Diagnosis not present

## 2019-02-11 DIAGNOSIS — B070 Plantar wart: Secondary | ICD-10-CM

## 2019-02-11 LAB — HEPATIC FUNCTION PANEL
ALT: 18 U/L (ref 0–35)
AST: 17 U/L (ref 0–37)
Albumin: 4.4 g/dL (ref 3.5–5.2)
Alkaline Phosphatase: 55 U/L (ref 39–117)
Bilirubin, Direct: 0.1 mg/dL (ref 0.0–0.3)
Total Bilirubin: 0.6 mg/dL (ref 0.2–1.2)
Total Protein: 6.8 g/dL (ref 6.0–8.3)

## 2019-02-11 MED ORDER — TERBINAFINE HCL 250 MG PO TABS: 250.0000 mg | ORAL_TABLET | Freq: Every day | ORAL | 1 refills | Status: DC

## 2019-02-11 NOTE — Progress Notes (Signed)
   Subjective:    Patient ID: Melody Sutton, female    DOB: 06-28-1951, 68 y.o.   MRN: [id number]  HPI Here for 2 issues. First we treated her last year for toenail fungus and she took Terbinafine for 3 months. It seemed to work but it has reappeared now. Also she has dealt with warts on the bottoms of her feet for years. Currently she has 3 painful warts, 2 on there right foot and one on the left.    Review of Systems  Constitutional: Negative.   Respiratory: Negative.   Cardiovascular: Negative.        Objective:   Physical Exam Constitutional:      Appearance: Normal appearance.  Cardiovascular:     Rate and Rhythm: Normal rate and regular rhythm.     Pulses: Normal pulses.     Heart sounds: Normal heart sounds.  Pulmonary:     Effort: Pulmonary effort is normal.     Breath sounds: Normal breath sounds.  Skin:    Comments: Several toenails show fungal involvement. The right sole has 2 plantar warts and the left one has one   Neurological:     Mental Status: She is alert.           Assessment & Plan:  For the onychomycosis, she will take Terbinafine for 6 months. For the warts, these were treated today with several cycles of cryotherapy.  Alysia Penna, MD

## 2019-02-12 ENCOUNTER — Encounter: Payer: Self-pay | Admitting: *Deleted

## 2019-02-27 ENCOUNTER — Encounter: Payer: Self-pay | Admitting: Gastroenterology

## 2019-05-12 ENCOUNTER — Other Ambulatory Visit: Payer: Self-pay | Admitting: Family Medicine

## 2019-05-12 NOTE — Telephone Encounter (Signed)
Medication Refill - Medication: atenolol-chlorthalidone (TENORETIC) 50-25 MG tablet   atorvastatin (LIPITOR) 40 MG tablet   meloxicam (MOBIC) 15 MG tablet  potassium chloride SA (K-DUR,KLOR-CON) 20 MEQ tablet  terbinafine (LAMISIL) 250 MG tablet   zolpidem (AMBIEN) 10 MG tablet   Has the patient contacted their pharmacy? Yes.   (Agent: If no, request that the patient contact the pharmacy for the refill.) (Agent: If yes, when and what did the pharmacy advise?)  Preferred Pharmacy (with phone number or street name):  [facility] [place], [place] [facility] [phone] (Phone) [phone] (Fax)     Agent: Please be advised that RX refills may take up to 3 business days. We ask that you follow-up with your pharmacy.

## 2019-05-12 NOTE — Telephone Encounter (Signed)
All medications pended.  All last filled on 04/07/18 Zolpidem last filled 12/05/2018  Last OV 02/11/2019 (acute) Patient is due for a physical.  Ok to fill?

## 2019-05-13 NOTE — Telephone Encounter (Signed)
She will need a CPE with labs before we can refill these

## 2019-05-13 NOTE — Telephone Encounter (Signed)
Left message for patient to call back. CRM created 

## 2019-05-14 ENCOUNTER — Telehealth: Payer: Self-pay

## 2019-05-14 MED ORDER — ATENOLOL-CHLORTHALIDONE 50-25 MG PO TABS: 1.0000 | ORAL_TABLET | Freq: Every day | ORAL | 0 refills | Status: DC

## 2019-05-14 MED ORDER — TERBINAFINE HCL 250 MG PO TABS: 250.0000 mg | ORAL_TABLET | Freq: Every day | ORAL | 0 refills | Status: DC

## 2019-05-14 MED ORDER — ATORVASTATIN CALCIUM 40 MG PO TABS: ORAL_TABLET | ORAL | 0 refills | Status: DC

## 2019-05-14 MED ORDER — POTASSIUM CHLORIDE CRYS ER 20 MEQ PO TBCR: 20.0000 meq | EXTENDED_RELEASE_TABLET | Freq: Every day | ORAL | 0 refills | Status: DC

## 2019-05-14 NOTE — Telephone Encounter (Signed)
Please advise. [facility] for temporary supply?

## 2019-05-14 NOTE — Telephone Encounter (Signed)
Copied from [place] [id number]. Topic: Quick Communication - See Telephone Encounter >> May 13, 2019  2:52 PM Rebecca Eaton, [place] wrote: CRM for notification. See refill encounter for: 05/12/19. Please let the patient know that she needs to have a physical before anymore refills can be sent to her pharmacy. Schedule patient of physical please. >> May 14, 2019 10:52 AM Percell Belt A wrote: Pt called and made a cpe for 9/18,  She would like to get enough meds to make it to her appt .   [facility]

## 2019-05-14 NOTE — Telephone Encounter (Signed)
Yes call in 30 days of everything except Zolpidem (she should have enough of that)

## 2019-05-14 NOTE — Telephone Encounter (Signed)
Prescriptions have been sent in. LM notifying patient.

## 2019-05-29 ENCOUNTER — Encounter: Payer: Self-pay | Admitting: Family Medicine

## 2019-05-29 ENCOUNTER — Ambulatory Visit (INDEPENDENT_AMBULATORY_CARE_PROVIDER_SITE_OTHER): Payer: Medicare Other | Admitting: Family Medicine

## 2019-05-29 ENCOUNTER — Other Ambulatory Visit: Payer: Self-pay

## 2019-05-29 VITALS — BP 180/88 | HR 63 | Temp 97.7°F | Ht 65.0 in | Wt 141.6 lb

## 2019-05-29 DIAGNOSIS — Z0000 Encounter for general adult medical examination without abnormal findings: Secondary | ICD-10-CM

## 2019-05-29 DIAGNOSIS — F5101 Primary insomnia: Secondary | ICD-10-CM | POA: Diagnosis not present

## 2019-05-29 DIAGNOSIS — E782 Mixed hyperlipidemia: Secondary | ICD-10-CM | POA: Diagnosis not present

## 2019-05-29 DIAGNOSIS — Z23 Encounter for immunization: Secondary | ICD-10-CM | POA: Diagnosis not present

## 2019-05-29 DIAGNOSIS — I10 Essential (primary) hypertension: Secondary | ICD-10-CM | POA: Diagnosis not present

## 2019-05-29 MED ORDER — ATENOLOL-CHLORTHALIDONE 50-25 MG PO TABS: 1.0000 | ORAL_TABLET | Freq: Every day | ORAL | 3 refills | Status: DC

## 2019-05-29 MED ORDER — ATORVASTATIN CALCIUM 40 MG PO TABS: ORAL_TABLET | ORAL | 3 refills | Status: DC

## 2019-05-29 MED ORDER — LOSARTAN POTASSIUM 50 MG PO TABS: 50.0000 mg | ORAL_TABLET | Freq: Every day | ORAL | 3 refills | Status: DC

## 2019-05-29 MED ORDER — POTASSIUM CHLORIDE CRYS ER 20 MEQ PO TBCR: 20.0000 meq | EXTENDED_RELEASE_TABLET | Freq: Every day | ORAL | 3 refills | Status: DC

## 2019-05-29 MED ORDER — MELOXICAM 15 MG PO TABS: 15.0000 mg | ORAL_TABLET | Freq: Every day | ORAL | 3 refills | Status: DC

## 2019-05-29 MED ORDER — TRAZODONE HCL 100 MG PO TABS: 100.0000 mg | ORAL_TABLET | Freq: Every day | ORAL | 1 refills | Status: DC

## 2019-05-29 MED ORDER — DIAZEPAM 5 MG PO TABS: 5.0000 mg | ORAL_TABLET | Freq: Two times a day (BID) | ORAL | 1 refills | Status: DC | PRN

## 2019-05-29 NOTE — Progress Notes (Signed)
Subjective:    Patient ID: Melody Sutton, female    DOB: Oct 13, 1950, 68 y.o.   MRN: [id number]  HPI Here to follow up on issues. She feels well. Her BP has been creeping up for a few months. She was to have had a colonoscopy in June but she has not heard from the GI office. She has had trouble sleeping lately. She has taken Ambie for years and it does not work as well as it used to.    Review of Systems  Constitutional: Negative.   HENT: Negative.   Eyes: Negative.   Respiratory: Negative.   Cardiovascular: Negative.   Gastrointestinal: Negative.   Genitourinary: Negative for decreased urine volume, difficulty urinating, dyspareunia, dysuria, enuresis, flank pain, frequency, hematuria, pelvic pain and urgency.  Musculoskeletal: Negative.   Skin: Negative.   Neurological: Negative.   Psychiatric/Behavioral: Positive for sleep disturbance.       Objective:   Physical Exam Constitutional:      General: She is not in acute distress.    Appearance: She is well-developed.  HENT:     Head: Normocephalic and atraumatic.     Right Ear: External ear normal.     Left Ear: External ear normal.     Nose: Nose normal.     Mouth/Throat:     Pharynx: No oropharyngeal exudate.  Eyes:     General: No scleral icterus.    Conjunctiva/sclera: Conjunctivae normal.     Pupils: Pupils are equal, round, and reactive to light.  Neck:     Musculoskeletal: Normal range of motion and neck supple.     Thyroid: No thyromegaly.     Vascular: No JVD.  Cardiovascular:     Rate and Rhythm: Normal rate and regular rhythm.     Heart sounds: Normal heart sounds. No murmur. No friction rub. No gallop.   Pulmonary:     Effort: Pulmonary effort is normal. No respiratory distress.     Breath sounds: Normal breath sounds. No wheezing or rales.  Chest:     Chest wall: No tenderness.  Abdominal:     General: Bowel sounds are normal. There is no distension.     Palpations: Abdomen is soft. There is no  mass.     Tenderness: There is no abdominal tenderness. There is no guarding or rebound.  Musculoskeletal: Normal range of motion.        General: No tenderness.  Lymphadenopathy:     Cervical: No cervical adenopathy.  Skin:    General: Skin is warm and dry.     Findings: No erythema or rash.  Neurological:     Mental Status: She is alert and oriented to person, place, and time.     Cranial Nerves: No cranial nerve deficit.     Motor: No abnormal muscle tone.     Coordination: Coordination normal.     Deep Tendon Reflexes: Reflexes are normal and symmetric. Reflexes normal.  Psychiatric:        Behavior: Behavior normal.        Thought Content: Thought content normal.        Judgment: Judgment normal.           Assessment & Plan:  Her HTN is not well controlled, so we will add Losartan 50 mg daily to her regimen. For sleep she will stop Ambien and try Trazodone 100 mg qhs. We will have the GI contact her about another colonoscopy. Set up fasting labs to check lipids, etc.  Alysia Penna, MD

## 2019-05-29 NOTE — Patient Instructions (Signed)
Health Maintenance Due  Topic Date Due  . COLONOSCOPY  02/18/2019  . INFLUENZA VACCINE  04/11/2019    Depression screen [facility] 2/9 02/07/2018 03/28/2016  Decreased Interest 0 0  Down, Depressed, Hopeless 0 0  PHQ - 2 Score 0 0

## 2019-06-01 ENCOUNTER — Other Ambulatory Visit: Payer: Self-pay

## 2019-06-01 ENCOUNTER — Other Ambulatory Visit (INDEPENDENT_AMBULATORY_CARE_PROVIDER_SITE_OTHER): Payer: Medicare Other

## 2019-06-01 DIAGNOSIS — I10 Essential (primary) hypertension: Secondary | ICD-10-CM | POA: Diagnosis not present

## 2019-06-01 LAB — BASIC METABOLIC PANEL
BUN: 13 mg/dL (ref 6–23)
CO2: 31 mEq/L (ref 19–32)
Calcium: 9.5 mg/dL (ref 8.4–10.5)
Chloride: 99 mEq/L (ref 96–112)
Creatinine, Ser: 0.65 mg/dL (ref 0.40–1.20)
GFR: 90.37 mL/min (ref >60.00)
Glucose, Bld: 89 mg/dL (ref 70–99)
Potassium: 3.6 mEq/L (ref 3.5–5.1)
Sodium: 138 mEq/L (ref 135–145)

## 2019-06-01 LAB — POC URINALSYSI DIPSTICK (AUTOMATED)
Bilirubin, UA: NEGATIVE
Glucose, UA: NEGATIVE
Ketones, UA: NEGATIVE
Leukocytes, UA: NEGATIVE
Nitrite, UA: NEGATIVE
Protein, UA: NEGATIVE
Spec Grav, UA: 1.015 (ref 1.010–1.025)
Urobilinogen, UA: 0.2 E.U./dL
pH, UA: 7 (ref 5.0–8.0)

## 2019-06-01 LAB — CBC WITH DIFFERENTIAL/PLATELET
Basophils Absolute: 0.1 10*3/uL (ref 0.0–0.1)
Basophils Relative: 0.7 % (ref 0.0–3.0)
Eosinophils Absolute: 0.3 10*3/uL (ref 0.0–0.7)
Eosinophils Relative: 3.2 % (ref 0.0–5.0)
HCT: 36.8 % (ref 36.0–46.0)
Hemoglobin: 12.5 g/dL (ref 12.0–15.0)
Lymphocytes Relative: 14.2 % (ref 12.0–46.0)
Lymphs Abs: 1.2 10*3/uL (ref 0.7–4.0)
MCHC: 34.1 g/dL (ref 30.0–36.0)
MCV: 90 fl (ref 78.0–100.0)
Monocytes Absolute: 0.7 10*3/uL (ref 0.1–1.0)
Monocytes Relative: 7.9 % (ref 3.0–12.0)
Neutro Abs: 6.4 10*3/uL (ref 1.4–7.7)
Neutrophils Relative %: 74 % (ref 43.0–77.0)
Platelets: 235 10*3/uL (ref 150.0–400.0)
RBC: 4.08 Mil/uL (ref 3.87–5.11)
RDW: 14 % (ref 11.5–15.5)
WBC: 8.7 10*3/uL (ref 4.0–10.5)

## 2019-06-01 LAB — HEPATIC FUNCTION PANEL
ALT: 23 U/L (ref 0–35)
AST: 26 U/L (ref 0–37)
Albumin: 4.2 g/dL (ref 3.5–5.2)
Alkaline Phosphatase: 46 U/L (ref 39–117)
Bilirubin, Direct: 0.1 mg/dL (ref 0.0–0.3)
Total Bilirubin: 0.5 mg/dL (ref 0.2–1.2)
Total Protein: 6.3 g/dL (ref 6.0–8.3)

## 2019-06-01 LAB — LIPID PANEL
Cholesterol: 171 mg/dL (ref 0–200)
HDL: 82 mg/dL (ref >39.00)
LDL Cholesterol: 77 mg/dL (ref 0–99)
NonHDL: 88.64
Total CHOL/HDL Ratio: 2
Triglycerides: 56 mg/dL (ref 0.0–149.0)
VLDL: 11.2 mg/dL (ref 0.0–40.0)

## 2019-06-01 LAB — TSH: TSH: 0.97 u[IU]/mL (ref 0.35–4.50)

## 2019-06-12 ENCOUNTER — Encounter: Payer: Self-pay | Admitting: Family Medicine

## 2019-06-12 ENCOUNTER — Other Ambulatory Visit: Payer: Self-pay | Admitting: Family Medicine

## 2019-06-12 MED ORDER — ZOLPIDEM TARTRATE 10 MG PO TABS: 10.0000 mg | ORAL_TABLET | Freq: Every evening | ORAL | 0 refills | Status: DC | PRN

## 2019-06-12 MED ORDER — ZOLPIDEM TARTRATE 10 MG PO TABS: 10.0000 mg | ORAL_TABLET | Freq: Every evening | ORAL | 1 refills | Status: DC | PRN

## 2019-06-12 NOTE — Telephone Encounter (Signed)
Done

## 2019-07-02 ENCOUNTER — Encounter: Payer: Self-pay | Admitting: Family Medicine

## 2019-07-22 ENCOUNTER — Encounter: Payer: Self-pay | Admitting: Family Medicine

## 2019-07-30 ENCOUNTER — Encounter: Payer: Self-pay | Admitting: Obstetrics & Gynecology

## 2019-07-30 ENCOUNTER — Other Ambulatory Visit: Payer: Self-pay | Admitting: Family Medicine

## 2019-07-30 ENCOUNTER — Ambulatory Visit (INDEPENDENT_AMBULATORY_CARE_PROVIDER_SITE_OTHER): Payer: Medicare Other | Admitting: Obstetrics & Gynecology

## 2019-07-30 ENCOUNTER — Other Ambulatory Visit: Payer: Self-pay

## 2019-07-30 VITALS — BP 126/78 | Ht 64.5 in | Wt 140.0 lb

## 2019-07-30 DIAGNOSIS — Z9289 Personal history of other medical treatment: Secondary | ICD-10-CM

## 2019-07-30 DIAGNOSIS — Z9189 Other specified personal risk factors, not elsewhere classified: Secondary | ICD-10-CM | POA: Diagnosis not present

## 2019-07-30 DIAGNOSIS — Z79890 Hormone replacement therapy (postmenopausal): Secondary | ICD-10-CM

## 2019-07-30 DIAGNOSIS — Z1272 Encounter for screening for malignant neoplasm of vagina: Secondary | ICD-10-CM

## 2019-07-30 DIAGNOSIS — M810 Age-related osteoporosis without current pathological fracture: Secondary | ICD-10-CM

## 2019-07-30 DIAGNOSIS — Z86001 Personal history of in-situ neoplasm of cervix uteri: Secondary | ICD-10-CM | POA: Diagnosis not present

## 2019-07-30 DIAGNOSIS — Z01419 Encounter for gynecological examination (general) (routine) without abnormal findings: Secondary | ICD-10-CM

## 2019-07-30 DIAGNOSIS — Z90710 Acquired absence of both cervix and uterus: Secondary | ICD-10-CM

## 2019-07-30 DIAGNOSIS — D069 Carcinoma in situ of cervix, unspecified: Secondary | ICD-10-CM

## 2019-07-30 MED ORDER — ESTRADIOL 0.025 MG/24HR TD PTTW: 1.0000 | MEDICATED_PATCH | TRANSDERMAL | 4 refills | Status: DC

## 2019-07-30 NOTE — Progress Notes (Signed)
Melody Sutton 07-05-1951 [id number]   History:    68 y.o. G0 Married  RP:  Established patient presenting for annual gyn exam   HPI: S/P Vaginal Hysterectomy for CIN 3.  Pap tests normal since.  Menopause syndrome, weaning x last year, now on Estradiol patch 0.05 twice weekly.  No pelvic pain.  No pain with intercourse. Breast normal.  Body mass index 23.66.  Physically active, will start a [place] labs with family physician.  Past medical history,surgical history, family history and social history were all reviewed and documented in the EPIC chart.  Gynecologic History Patient's last menstrual period was 09/10/1978. Contraception: status post hysterectomy Last Pap: 07/2018. Results were: Negative Last mammogram: 07/2018. Results were: Negative Bone Density: 04/2017 Normal Colonoscopy: 02/2014  Obstetric History OB History  Gravida Para Term Preterm AB Living  0       0 0  SAB TAB Ectopic Multiple Live Births                ROS: A ROS was performed and pertinent positives and negatives are included in the history.  GENERAL: No fevers or chills. HEENT: No change in vision, no earache, sore throat or sinus congestion. NECK: No pain or stiffness. CARDIOVASCULAR: No chest pain or pressure. No palpitations. PULMONARY: No shortness of breath, cough or wheeze. GASTROINTESTINAL: No abdominal pain, nausea, vomiting or diarrhea, melena or bright red blood per rectum. GENITOURINARY: No urinary frequency, urgency, hesitancy or dysuria. MUSCULOSKELETAL: No joint or muscle pain, no back pain, no recent trauma. DERMATOLOGIC: No rash, no itching, no lesions. ENDOCRINE: No polyuria, polydipsia, no heat or cold intolerance. No recent change in weight. HEMATOLOGICAL: No anemia or easy bruising or bleeding. NEUROLOGIC: No headache, seizures, numbness, tingling or weakness. PSYCHIATRIC: No depression, no loss of interest in normal activity or change in sleep pattern.     Exam:    BP 126/78   Ht 5' 4.5" (1.638 m)   Wt 140 lb (63.5 kg)   LMP 09/10/1978   BMI 23.66 kg/m   Body mass index is 23.66 kg/m.  General appearance : Well developed well nourished female. No acute distress HEENT: Eyes: no retinal hemorrhage or exudates,  Neck supple, trachea midline, no carotid bruits, no thyroidmegaly Lungs: Clear to auscultation, no rhonchi or wheezes, or rib retractions  Heart: Regular rate and rhythm, no murmurs or gallops Breast:Examined in sitting and supine position were symmetrical in appearance, no palpable masses or tenderness,  no skin retraction, no nipple inversion, no nipple discharge, no skin discoloration, no axillary or supraclavicular lymphadenopathy Abdomen: no palpable masses or tenderness, no rebound or guarding Extremities: no edema or skin discoloration or tenderness  Pelvic: Vulva: Normal             Vagina: No gross lesions or discharge.  Pap reflex done  Cervix/Uterus absent  Adnexa  Without masses or tenderness  Anus: Normal   Assessment/Plan:  68 y.o. female for annual exam   1. Encounter for Papanicolaou smear of vagina as part of routine gynecological examination Gynecologic exam status post total hysterectomy and menopause.  Pap test of the vaginal vault was done today.  Breast exam normal.  Screening mammogram November 2019 was negative.  Colonoscopy in June 2015.  Health labs with family physician.  Good body mass index at 23.66.  We will continue with healthy nutrition and fitness.  2. H/O total hysterectomy  3. Severe cervical dysplasia, histologically confirmed S/P Total Hysterectomy.  Pap  tests negative since then.  4. Post-menopause on HRT (hormone replacement therapy) Post menopause, well on estradiol 0.025 patch weekly.  No contraindication to continue at this low dose.  Recommend vitamin D supplements, calcium intake of 1200 mg daily and regular weightbearing physical activities.  Other orders - fluticasone (FLONASE) 50  MCG/ACT nasal spray; Place into both nostrils daily. - estradiol (VIVELLE-DOT) 0.025 MG/24HR; Place 1 patch onto the skin 2 (two) times a week.  Princess Bruins MD, 12:02 PM 07/30/2019

## 2019-07-31 ENCOUNTER — Encounter: Payer: Self-pay | Admitting: Family Medicine

## 2019-08-01 DIAGNOSIS — Z1231 Encounter for screening mammogram for malignant neoplasm of breast: Secondary | ICD-10-CM | POA: Diagnosis not present

## 2019-08-01 DIAGNOSIS — Z803 Family history of malignant neoplasm of breast: Secondary | ICD-10-CM | POA: Diagnosis not present

## 2019-08-03 LAB — PAP IG W/ RFLX HPV ASCU

## 2019-08-04 ENCOUNTER — Other Ambulatory Visit: Payer: Self-pay

## 2019-08-04 ENCOUNTER — Ambulatory Visit
Admission: RE | Admit: 2019-08-04 | Discharge: 2019-08-04 | Disposition: A | Discharge status: Home / Self Care | Payer: Medicare Other | Source: Ambulatory Visit | Attending: Family Medicine | Admitting: Family Medicine

## 2019-08-04 DIAGNOSIS — M810 Age-related osteoporosis without current pathological fracture: Secondary | ICD-10-CM

## 2019-08-04 DIAGNOSIS — Z780 Asymptomatic menopausal state: Secondary | ICD-10-CM | POA: Diagnosis not present

## 2019-08-04 DIAGNOSIS — Z13820 Encounter for screening for osteoporosis: Secondary | ICD-10-CM | POA: Diagnosis not present

## 2019-08-09 ENCOUNTER — Encounter: Payer: Self-pay | Admitting: Obstetrics & Gynecology

## 2019-08-09 NOTE — Patient Instructions (Signed)
1. Encounter for Papanicolaou smear of vagina as part of routine gynecological examination Gynecologic exam status post total hysterectomy and menopause.  Pap test of the vaginal vault was done today.  Breast exam normal.  Screening mammogram November 2019 was negative.  Colonoscopy in June 2015.  Health labs with family physician.  Good body mass index at 23.66.  We will continue with healthy nutrition and fitness.  2. H/O total hysterectomy  3. Severe cervical dysplasia, histologically confirmed S/P Total Hysterectomy.  Pap tests negative since then.  4. Post-menopause on HRT (hormone replacement therapy) Post menopause, well on estradiol 0.025 patch weekly.  No contraindication to continue at this low dose.  Recommend vitamin D supplements, calcium intake of 1200 mg daily and regular weightbearing physical activities.  Other orders - fluticasone (FLONASE) 50 MCG/ACT nasal spray; Place into both nostrils daily. - estradiol (VIVELLE-DOT) 0.025 MG/24HR; Place 1 patch onto the skin 2 (two) times a week.  Hajrah, it was a pleasure seeing you today!  I will inform you of your results as soon as they are available.

## 2019-08-10 ENCOUNTER — Encounter: Payer: Self-pay | Admitting: *Deleted

## 2019-08-29 ENCOUNTER — Other Ambulatory Visit: Payer: Self-pay | Admitting: Obstetrics & Gynecology

## 2019-11-05 ENCOUNTER — Ambulatory Visit: Payer: Medicare Other | Attending: Internal Medicine

## 2019-11-05 DIAGNOSIS — Z23 Encounter for immunization: Secondary | ICD-10-CM | POA: Insufficient documentation

## 2019-11-05 NOTE — Progress Notes (Signed)
   Covid-19 Vaccination Clinic  Name:  Melody Sutton    MRN: [id number] DOB: Jun 03, 1951  11/05/2019  [name] was observed post Covid-19 immunization for 15 minutes without incidence. She was provided with Vaccine Information Sheet and instruction to access the V-Safe system.   [name] was instructed to call 911 with any severe reactions post vaccine: Marland Kitchen Difficulty breathing  . Swelling of your face and throat  . A fast heartbeat  . A bad rash all over your body  . Dizziness and weakness    Immunizations Administered    Name Date Dose VIS Date Route   Pfizer COVID-19 Vaccine 11/05/2019  3:55 PM 0.3 mL 08/21/2019 Intramuscular   Manufacturer: [place]   Lot: [id number]   [place]: [id number]

## 2019-12-02 ENCOUNTER — Ambulatory Visit: Payer: Medicare Other | Attending: Internal Medicine

## 2019-12-02 DIAGNOSIS — Z23 Encounter for immunization: Secondary | ICD-10-CM

## 2019-12-02 NOTE — Progress Notes (Signed)
   Covid-19 Vaccination Clinic  Name:  JAKALYN RENNA    MRN: [id number] DOB: Jun 23, 1951  12/02/2019  Ms. Haven was observed post Covid-19 immunization for 15 minutes without incident. She was provided with Vaccine Information Sheet and instruction to access the V-Safe system.   Ms. Raymon was instructed to call 911 with any severe reactions post vaccine: Marland Kitchen Difficulty breathing  . Swelling of face and throat  . A fast heartbeat  . A bad rash all over body  . Dizziness and weakness   Immunizations Administered    Name Date Dose VIS Date Route   Pfizer COVID-19 Vaccine 12/02/2019 10:58 AM 0.3 mL 08/21/2019 Intramuscular   Manufacturer: [place]   Lot: [id number]   [place]: [id number]

## 2019-12-30 ENCOUNTER — Other Ambulatory Visit: Payer: Self-pay | Admitting: Family Medicine

## 2019-12-31 NOTE — Telephone Encounter (Signed)
Advised to leave for Dr. Sarajane Jews upon his return.

## 2019-12-31 NOTE — Telephone Encounter (Signed)
Noted. Will send to Dr. Fry °

## 2019-12-31 NOTE — Telephone Encounter (Signed)
Last OV 05/29/2019  Last filled 06/12/2019, # [phone], with 1 refill

## 2019-12-31 NOTE — Telephone Encounter (Signed)
Option  1  I will send  30 day refill to local pharmacy in Dr Sarajane Jews absence  Option 2   Wait for dr Clarene Duke to return to decide on mail away refills   Let me know

## 2020-03-18 ENCOUNTER — Other Ambulatory Visit: Payer: Self-pay | Admitting: Family Medicine

## 2020-04-03 ENCOUNTER — Other Ambulatory Visit: Payer: Self-pay | Admitting: Family Medicine

## 2020-04-18 ENCOUNTER — Telehealth: Payer: Self-pay | Admitting: Family Medicine

## 2020-04-18 DIAGNOSIS — Z0000 Encounter for general adult medical examination without abnormal findings: Secondary | ICD-10-CM

## 2020-04-18 NOTE — Telephone Encounter (Signed)
Done

## 2020-05-24 ENCOUNTER — Telehealth: Payer: Self-pay | Admitting: Family Medicine

## 2020-05-24 NOTE — Telephone Encounter (Signed)
Okay to schedule

## 2020-05-24 NOTE — Telephone Encounter (Signed)
Pt would like the pneumonia vaccine inj-sending message back for approval to schedule   Pt can be reached at [phone] or [email or web address]

## 2020-05-25 ENCOUNTER — Other Ambulatory Visit: Payer: Self-pay | Admitting: Family Medicine

## 2020-05-25 NOTE — Telephone Encounter (Signed)
Yes please give her a pneumococcal 23 vaccine

## 2020-05-25 NOTE — Telephone Encounter (Signed)
Okay to schedule

## 2020-05-30 ENCOUNTER — Other Ambulatory Visit: Payer: Self-pay | Admitting: Family Medicine

## 2020-06-02 ENCOUNTER — Other Ambulatory Visit: Payer: Self-pay

## 2020-06-02 ENCOUNTER — Ambulatory Visit (INDEPENDENT_AMBULATORY_CARE_PROVIDER_SITE_OTHER): Payer: Medicare Other | Admitting: *Deleted

## 2020-06-02 DIAGNOSIS — Z23 Encounter for immunization: Secondary | ICD-10-CM

## 2020-06-20 ENCOUNTER — Other Ambulatory Visit: Payer: Self-pay

## 2020-06-21 ENCOUNTER — Encounter: Payer: Self-pay | Admitting: Family Medicine

## 2020-06-21 ENCOUNTER — Ambulatory Visit (INDEPENDENT_AMBULATORY_CARE_PROVIDER_SITE_OTHER): Payer: Medicare Other | Admitting: Family Medicine

## 2020-06-21 VITALS — BP 138/80 | HR 65 | Temp 98.4°F | Ht 65.0 in | Wt 137.6 lb

## 2020-06-21 DIAGNOSIS — I10 Essential (primary) hypertension: Secondary | ICD-10-CM

## 2020-06-21 DIAGNOSIS — E782 Mixed hyperlipidemia: Secondary | ICD-10-CM

## 2020-06-21 DIAGNOSIS — F5101 Primary insomnia: Secondary | ICD-10-CM | POA: Diagnosis not present

## 2020-06-21 MED ORDER — ATORVASTATIN CALCIUM 40 MG PO TABS: ORAL_TABLET | ORAL | 3 refills | Status: DC

## 2020-06-21 MED ORDER — POTASSIUM CHLORIDE CRYS ER 20 MEQ PO TBCR
20.0000 meq | EXTENDED_RELEASE_TABLET | Freq: Every day | ORAL | 3 refills | Status: DC
Start: 2020-06-21 — End: 2021-07-25

## 2020-06-21 MED ORDER — VALACYCLOVIR HCL 1 G PO TABS: 1000.0000 mg | ORAL_TABLET | Freq: Three times a day (TID) | ORAL | 0 refills | Status: DC

## 2020-06-21 MED ORDER — ZOLPIDEM TARTRATE ER 12.5 MG PO TBCR: 12.5000 mg | EXTENDED_RELEASE_TABLET | Freq: Every evening | ORAL | 1 refills | Status: DC | PRN

## 2020-06-21 MED ORDER — LOSARTAN POTASSIUM 50 MG PO TABS: 50.0000 mg | ORAL_TABLET | Freq: Every day | ORAL | 3 refills | Status: DC

## 2020-06-21 MED ORDER — MELOXICAM 15 MG PO TABS: 15.0000 mg | ORAL_TABLET | Freq: Every day | ORAL | 3 refills | Status: DC

## 2020-06-21 NOTE — Progress Notes (Signed)
   Subjective:    Patient ID: Melody Sutton, female    DOB: April 12, 1951, 69 y.o.   MRN: [id number]  HPI Here to follow up on issues. She feels well. Her BP has been stable. She feels asleep easily but she tends to wake up early in the mornings. She asks if there is a time release from of Ambien to try. Her GYN has been decreasing her dose of Estradiol patches with the goal of stopping this completely sometime soon.    Review of Systems  Constitutional: Negative.   HENT: Negative.   Eyes: Negative.   Respiratory: Negative.   Cardiovascular: Negative.   Gastrointestinal: Negative.   Genitourinary: Negative for decreased urine volume, difficulty urinating, dyspareunia, dysuria, enuresis, flank pain, frequency, hematuria, pelvic pain and urgency.  Musculoskeletal: Negative.   Skin: Negative.   Neurological: Negative.   Psychiatric/Behavioral: Positive for sleep disturbance.       Objective:   Physical Exam Constitutional:      General: She is not in acute distress.    Appearance: She is well-developed.  HENT:     Head: Normocephalic and atraumatic.     Right Ear: External ear normal.     Left Ear: External ear normal.     Nose: Nose normal.     Mouth/Throat:     Pharynx: No oropharyngeal exudate.  Eyes:     General: No scleral icterus.    Conjunctiva/sclera: Conjunctivae normal.     Pupils: Pupils are equal, round, and reactive to light.  Neck:     Thyroid: No thyromegaly.     Vascular: No JVD.  Cardiovascular:     Rate and Rhythm: Normal rate and regular rhythm.     Heart sounds: Normal heart sounds. No murmur heard.  No friction rub. No gallop.   Pulmonary:     Effort: Pulmonary effort is normal. No respiratory distress.     Breath sounds: Normal breath sounds. No wheezing or rales.  Chest:     Chest wall: No tenderness.  Abdominal:     General: Bowel sounds are normal. There is no distension.     Palpations: Abdomen is soft. There is no mass.     Tenderness: There  is no abdominal tenderness. There is no guarding or rebound.  Musculoskeletal:        General: No tenderness. Normal range of motion.     Cervical back: Normal range of motion and neck supple.  Lymphadenopathy:     Cervical: No cervical adenopathy.  Skin:    General: Skin is warm and dry.     Findings: No erythema or rash.  Neurological:     Mental Status: She is alert and oriented to person, place, and time.     Cranial Nerves: No cranial nerve deficit.     Motor: No abnormal muscle tone.     Coordination: Coordination normal.     Deep Tendon Reflexes: Reflexes are normal and symmetric. Reflexes normal.  Psychiatric:        Behavior: Behavior normal.        Thought Content: Thought content normal.        Judgment: Judgment normal.           Assessment & Plan:  Her arthritis and HTN are stable. For the insomnia, she will try Ambien CR at bedtime. Get fasting labs today to check lipids, etc.  Alysia Penna, MD

## 2020-06-22 LAB — HEPATIC FUNCTION PANEL
AG Ratio: 1.8 (calc) (ref 1.0–2.5)
ALT: 17 U/L (ref 6–29)
AST: 22 U/L (ref 10–35)
Albumin: 4.4 g/dL (ref 3.6–5.1)
Alkaline phosphatase (APISO): 49 U/L (ref 37–153)
Bilirubin, Direct: 0.2 mg/dL (ref 0.0–0.2)
Globulin: 2.4 g/dL (calc) (ref 1.9–3.7)
Indirect Bilirubin: 0.7 mg/dL (calc) (ref 0.2–1.2)
Total Bilirubin: 0.9 mg/dL (ref 0.2–1.2)
Total Protein: 6.8 g/dL (ref 6.1–8.1)

## 2020-06-22 LAB — CBC WITH DIFFERENTIAL/PLATELET
Absolute Monocytes: 915 cells/uL (ref 200–950)
Basophils Absolute: 90 cells/uL (ref 0–200)
Basophils Relative: 0.6 %
Eosinophils Absolute: 180 cells/uL (ref 15–500)
Eosinophils Relative: 1.2 %
HCT: 34.4 % — ABNORMAL LOW (ref 35.0–45.0)
Hemoglobin: 11.4 g/dL — ABNORMAL LOW (ref 11.7–15.5)
Lymphs Abs: 1350 cells/uL (ref 850–3900)
MCH: 27.9 pg (ref 27.0–33.0)
MCHC: 33.1 g/dL (ref 32.0–36.0)
MCV: 84.1 fL (ref 80.0–100.0)
MPV: 10.2 fL (ref 7.5–12.5)
Monocytes Relative: 6.1 %
Neutro Abs: 12465 cells/uL — ABNORMAL HIGH (ref 1500–7800)
Neutrophils Relative %: 83.1 %
Platelets: 297 10*3/uL (ref 140–400)
RBC: 4.09 10*6/uL (ref 3.80–5.10)
RDW: 14.1 % (ref 11.0–15.0)
Total Lymphocyte: 9 %
WBC: 15 10*3/uL — ABNORMAL HIGH (ref 3.8–10.8)

## 2020-06-22 LAB — LIPID PANEL
Cholesterol: 195 mg/dL (ref <200)
HDL: 101 mg/dL (ref >=50)
LDL Cholesterol (Calc): 79 mg/dL (calc)
Non-HDL Cholesterol (Calc): 94 mg/dL (calc) (ref <130)
Total CHOL/HDL Ratio: 1.9 (calc) (ref <5.0)
Triglycerides: 74 mg/dL (ref <150)

## 2020-06-22 LAB — BASIC METABOLIC PANEL
BUN/Creatinine Ratio: 24 (calc) — ABNORMAL HIGH (ref 6–22)
BUN: 14 mg/dL (ref 7–25)
CO2: 30 mmol/L (ref 20–32)
Calcium: 9.5 mg/dL (ref 8.6–10.4)
Chloride: 101 mmol/L (ref 98–110)
Creat: 0.59 mg/dL — ABNORMAL LOW (ref 0.60–0.93)
Glucose, Bld: 99 mg/dL (ref 65–99)
Potassium: 3.6 mmol/L (ref 3.5–5.3)
Sodium: 138 mmol/L (ref 135–146)

## 2020-06-22 LAB — TSH: TSH: 1.2 mIU/L (ref 0.40–4.50)

## 2020-06-23 ENCOUNTER — Telehealth: Payer: Self-pay

## 2020-06-23 NOTE — Telephone Encounter (Signed)
Patient called and advised we received a fax from [employer] for a PA for Zolpidem and in order to complete it, we will need her Medicare Part B number. She says she is heading out soon and will bring her cards to place on file.

## 2020-06-23 NOTE — Telephone Encounter (Signed)
Noted  

## 2020-06-23 NOTE — Telephone Encounter (Signed)
Pt brought Medicare A&B ID card. Scanned in chart.

## 2020-07-18 ENCOUNTER — Encounter: Payer: Self-pay | Admitting: Family Medicine

## 2020-07-19 MED ORDER — ZOLPIDEM TARTRATE 10 MG PO TABS: 10.0000 mg | ORAL_TABLET | Freq: Every evening | ORAL | 5 refills | Status: DC | PRN

## 2020-07-19 NOTE — Telephone Encounter (Signed)
I refilled the regular Zolpidem

## 2020-07-23 ENCOUNTER — Ambulatory Visit: Payer: Medicare Other | Attending: Internal Medicine

## 2020-07-23 DIAGNOSIS — Z23 Encounter for immunization: Secondary | ICD-10-CM

## 2020-07-23 NOTE — Progress Notes (Signed)
   Covid-19 Vaccination Clinic  Name:  NYLAN NAKATANI    MRN: [id number] DOB: 1950-10-20  07/23/2020  Ms. Rosello was observed post Covid-19 immunization for 15 minutes without incident. She was provided with Vaccine Information Sheet and instruction to access the V-Safe system.   Ms. Marcoe was instructed to call 911 with any severe reactions post vaccine: Marland Kitchen Difficulty breathing  . Swelling of face and throat  . A fast heartbeat  . A bad rash all over body  . Dizziness and weakness   Immunizations Administered    Name Date Dose VIS Date Route   Pfizer COVID-19 Vaccine 07/23/2020  1:41 PM 0.3 mL 06/29/2020 Intramuscular   Manufacturer: [place]   Lot: [id number]   [place]: [id number]

## 2020-07-26 ENCOUNTER — Telehealth: Payer: Self-pay | Admitting: Family Medicine

## 2020-07-26 NOTE — Telephone Encounter (Signed)
Left message for patient to schedule Annual Wellness Visit.  Please schedule with Nurse Health Advisor Shannon Crews, RN at [facility]  

## 2020-07-29 MED ORDER — ZOLPIDEM TARTRATE 10 MG PO TABS: 10.0000 mg | ORAL_TABLET | Freq: Every evening | ORAL | 1 refills | Status: DC | PRN

## 2020-07-29 NOTE — Telephone Encounter (Signed)
I sent this to [facility]

## 2020-08-13 ENCOUNTER — Encounter: Payer: Self-pay | Admitting: Family Medicine

## 2020-08-13 DIAGNOSIS — Z1231 Encounter for screening mammogram for malignant neoplasm of breast: Secondary | ICD-10-CM | POA: Diagnosis not present

## 2020-08-16 ENCOUNTER — Other Ambulatory Visit: Payer: Self-pay | Admitting: Obstetrics & Gynecology

## 2020-09-13 ENCOUNTER — Encounter: Payer: Self-pay | Admitting: Family Medicine

## 2020-09-14 MED ORDER — ZOLPIDEM TARTRATE 10 MG PO TABS: 10.0000 mg | ORAL_TABLET | Freq: Every evening | ORAL | 1 refills | Status: DC | PRN

## 2020-09-14 NOTE — Telephone Encounter (Signed)
This was sent in  

## 2020-09-28 MED ORDER — ESTRADIOL 0.025 MG/24HR TD PTTW
1.0000 | MEDICATED_PATCH | TRANSDERMAL | 0 refills | Status: DC
Start: 2020-09-29 — End: 2020-10-19

## 2020-10-19 ENCOUNTER — Ambulatory Visit (INDEPENDENT_AMBULATORY_CARE_PROVIDER_SITE_OTHER): Payer: Medicare Other | Admitting: Obstetrics & Gynecology

## 2020-10-19 ENCOUNTER — Other Ambulatory Visit: Payer: Self-pay

## 2020-10-19 ENCOUNTER — Encounter: Payer: Self-pay | Admitting: Obstetrics & Gynecology

## 2020-10-19 VITALS — BP 158/80 | HR 72 | Ht 64.5 in | Wt 138.0 lb

## 2020-10-19 DIAGNOSIS — Z01419 Encounter for gynecological examination (general) (routine) without abnormal findings: Secondary | ICD-10-CM | POA: Diagnosis not present

## 2020-10-19 DIAGNOSIS — Z1272 Encounter for screening for malignant neoplasm of vagina: Secondary | ICD-10-CM | POA: Diagnosis not present

## 2020-10-19 DIAGNOSIS — Z79890 Hormone replacement therapy (postmenopausal): Secondary | ICD-10-CM

## 2020-10-19 DIAGNOSIS — D069 Carcinoma in situ of cervix, unspecified: Secondary | ICD-10-CM | POA: Diagnosis not present

## 2020-10-19 DIAGNOSIS — Z90710 Acquired absence of both cervix and uterus: Secondary | ICD-10-CM

## 2020-10-19 MED ORDER — ESTRADIOL 0.025 MG/24HR TD PTTW
1.0000 | MEDICATED_PATCH | TRANSDERMAL | 4 refills | Status: DC
Start: 2020-10-20 — End: 2021-10-23

## 2020-10-19 NOTE — Progress Notes (Signed)
Melody Sutton May 06, 1951 [id number]   History:    70 y.o. G0 Married.  Enjoys birds.  XN:[id number] presenting for annual gyn exam   HPI:S/P Vaginal Hysterectomy for CIN 3. Pap tests normal since. Menopause syndrome on Estradiol patch decreased to 0.025 weekly. Still having hot flushes.  No pelvic pain. No pain with intercourse. Breast normal. Body mass index 23.32. Physically active. Health labs with family physician.  Past medical history,surgical history, family history and social history were all reviewed and documented in the EPIC chart.  Gynecologic History Patient's last menstrual period was 09/10/1978.  Obstetric History OB History  Gravida Para Term Preterm AB Living  0       0 0  SAB IAB Ectopic Multiple Live Births                ROS: A ROS was performed and pertinent positives and negatives are included in the history.  GENERAL: No fevers or chills. HEENT: No change in vision, no earache, sore throat or sinus congestion. NECK: No pain or stiffness. CARDIOVASCULAR: No chest pain or pressure. No palpitations. PULMONARY: No shortness of breath, cough or wheeze. GASTROINTESTINAL: No abdominal pain, nausea, vomiting or diarrhea, melena or bright red blood per rectum. GENITOURINARY: No urinary frequency, urgency, hesitancy or dysuria. MUSCULOSKELETAL: No joint or muscle pain, no back pain, no recent trauma. DERMATOLOGIC: No rash, no itching, no lesions. ENDOCRINE: No polyuria, polydipsia, no heat or cold intolerance. No recent change in weight. HEMATOLOGICAL: No anemia or easy bruising or bleeding. NEUROLOGIC: No headache, seizures, numbness, tingling or weakness. PSYCHIATRIC: No depression, no loss of interest in normal activity or change in sleep pattern.     Exam:   BP (!) 170/88 (BP Location: Right Arm, Patient Position: Sitting, Cuff Size: Normal)   Pulse 72   Ht 5' 4.5" (1.638 m)   Wt 138 lb (62.6 kg)   LMP 09/10/1978   BMI 23.32 kg/m    Body mass index is 23.32 kg/m.  General appearance : Well developed well nourished female. No acute distress HEENT: Eyes: no retinal hemorrhage or exudates,  Neck supple, trachea midline, no carotid bruits, no thyroidmegaly Lungs: Clear to auscultation, no rhonchi or wheezes, or rib retractions  Heart: Regular rate and rhythm, no murmurs or gallops Breast:Examined in sitting and supine position were symmetrical in appearance, no palpable masses or tenderness,  no skin retraction, no nipple inversion, no nipple discharge, no skin discoloration, no axillary or supraclavicular lymphadenopathy Abdomen: no palpable masses or tenderness, no rebound or guarding Extremities: no edema or skin discoloration or tenderness  Pelvic: Vulva: Normal             Vagina: No gross lesions or discharge.  Pap reflex done.  Cervix/Uterus absent  Adnexa  Without masses or tenderness  Anus: Normal   Assessment/Plan:  70 y.o. female for annual exam   1. Encounter for Papanicolaou smear of vagina as part of routine gynecological examination Gynecologic exam s/p Total Hysterectomy in Menopause.  Pap reflex done on vaginal vault.  Breasts normal.  Screening mammo 08/2020 Neg.  [place] 2015, repeat at 10 yrs.  Health labs with Fam MD.  BMI 23.32.  Continue with fitness/healthy nutrition.  2. Severe cervical dysplasia, histologically confirmed Pap reflex done today.  3. H/O total hysterectomy For CIN 3.  4. Post-menopause on HRT (hormone replacement therapy) Well on HRT with Estradiol patch 0.025 weekly.  Will continue at that dosage given that she is still experiencing hot flushes.  Patient understands the risks/benefits of HRT at this point in her life.  Other orders - estradiol (VIVELLE-DOT) 0.025 MG/24HR; Place 1 patch onto the skin 2 (two) times a week.  Princess Bruins MD, 2:46 PM 10/19/2020

## 2020-10-20 ENCOUNTER — Encounter: Payer: Self-pay | Admitting: Obstetrics & Gynecology

## 2020-10-21 LAB — PAP IG W/ RFLX HPV ASCU

## 2020-11-04 ENCOUNTER — Ambulatory Visit: Payer: Medicare Other

## 2020-11-17 NOTE — Progress Notes (Addendum)
Subjective:   Melody Sutton is a 70 y.o. female who presents for Medicare Annual (Subsequent) preventive examination.  Review of Systems    N/A  Cardiac Risk Factors include: advanced age (>66men, >33 women);hypertension;dyslipidemia     Objective:    Today's Vitals   11/18/20 1359  BP: (!) 148/80  Pulse: 71  Temp: 98.4 F (36.9 C)  TempSrc: Oral  SpO2: 96%  Weight: 140 lb 4 oz (63.6 kg)  Height: 5\' 5"  (1.651 m)   Body mass index is 23.34 kg/m.  Advanced Directives 11/18/2020 02/07/2018 02/03/2014  Does Patient Have a Medical Advance Directive? Yes Yes Patient has advance directive, copy not in chart  Type of Advance Directive [place];Living will - -  Does patient want to make changes to medical advance directive? No - Patient declined - No change requested  Copy of Tracy in Chart? No - copy requested - -  Pre-existing out of facility DNR order (yellow form or pink MOST form) - - No    Current Medications (verified) Outpatient Encounter Medications as of 11/18/2020  Medication Sig  . atenolol-chlorthalidone (TENORETIC) 50-25 MG tablet TAKE 1 TABLET DAILY  . atorvastatin (LIPITOR) 40 MG tablet TAKE 1 TABLET DAILY  . Calcium-Magnesium-Vitamin D (CALCIUM 1200+D3 PO) Take by mouth.  . Cyanocobalamin (VITAMIN B 12 PO) Take 1,000 mcg by mouth daily.  Marland Kitchen estradiol (VIVELLE-DOT) 0.025 MG/24HR Place 1 patch onto the skin 2 (two) times a week.  . losartan (COZAAR) 50 MG tablet Take 1 tablet (50 mg total) by mouth daily.  . meloxicam (MOBIC) 15 MG tablet Take 1 tablet (15 mg total) by mouth daily.  Marland Kitchen OVER THE COUNTER MEDICATION [facility] brand 24-hour nondrowsy allergy med daily equivalent to Claritin  . potassium chloride SA (KLOR-CON) 20 MEQ tablet Take 1 tablet (20 mEq total) by mouth daily.  . psyllium (METAMUCIL SMOOTH TEXTURE) 28 % packet Take 1 packet by mouth 2 (two) times daily.  Marland Kitchen pyridOXINE (VITAMIN B-6) 100 MG tablet Take 100  mg by mouth daily.  . vitamin E 1000 UNIT capsule Take 1 capsule (1,000 Units total) by mouth daily. (Patient taking differently: Take 400 Units by mouth daily.)  . zolpidem (AMBIEN) 10 MG tablet Take 1 tablet (10 mg total) by mouth at bedtime as needed for sleep.  . [DISCONTINUED] diazepam (VALIUM) 5 MG tablet Take 1 tablet (5 mg total) by mouth every 12 (twelve) hours as needed for anxiety. (Patient not taking: Reported on 10/19/2020)  . [DISCONTINUED] fluticasone (FLONASE) 50 MCG/ACT nasal spray Place into both nostrils daily. (Patient not taking: No sig reported)   No facility-administered encounter medications on file as of 11/18/2020.    Allergies (verified) Patient has no known allergies.   History: Past Medical History:  Diagnosis Date  . CIN III (cervical intraepithelial neoplasia grade III) with severe dysplasia   . Dysmenorrhea   . Granuloma annulare   . Hypertension   . Ovarian cyst    Past Surgical History:  Procedure Laterality Date  . COLONOSCOPY  02/17/2014   per Dr. Olevia Perches, adenomatous polyps, repeat in 7 yrs   . OVARIAN CYST SURGERY    . TUBAL LIGATION    . VAGINAL HYSTERECTOMY     Family History  Problem Relation Age of Onset  . Hypertension Mother   . Breast cancer Maternal Aunt   . Cancer Maternal Aunt        Vulvar Ca in situ  . Diabetes Maternal Uncle   .  Colon cancer Neg Hx   . Pancreatic cancer Neg Hx   . Stomach cancer Neg Hx    Social History   Socioeconomic History  . Marital status: Married    Spouse name: Not on file  . Number of children: Not on file  . Years of education: Not on file  . Highest education level: Not on file  Occupational History  . Not on file  Tobacco Use  . Smoking status: Former Smoker    Packs/day: 1.00    Years: 32.00    Pack years: 32.00    Quit date: 06/11/1991    Years since quitting: 29.4  . Smokeless tobacco: Never Used  Vaping Use  . Vaping Use: Never used  Substance and Sexual Activity  . Alcohol use:  Yes    Alcohol/week: 7.0 standard drinks    Types: 7 Glasses of wine per week    Comment: daily ( 2)   . Drug use: No  . Sexual activity: Yes    Partners: Male    Birth control/protection: Surgical    Comment: 1st intercourse- 16, partners-  30, married- 57 yrs   Other Topics Concern  . Not on file  Social History Narrative  . Not on file   Social Determinants of Health   Financial Resource Strain: Low Risk   . Difficulty of Paying Living Expenses: Not hard at all  Food Insecurity: No Food Insecurity  . Worried About Charity fundraiser in the Last Year: Never true  . Ran Out of Food in the Last Year: Never true  Transportation Needs: No Transportation Needs  . Lack of Transportation (Medical): No  . Lack of Transportation (Non-Medical): No  Physical Activity: Inactive  . Days of Exercise per Week: 0 days  . Minutes of Exercise per Session: 0 min  Stress: No Stress Concern Present  . Feeling of Stress : Not at all  Social Connections: Moderately Isolated  . Frequency of Communication with Friends and Family: Twice a week  . Frequency of Social Gatherings with Friends and Family: Three times a week  . Attends Religious Services: Never  . Active Member of Clubs or Organizations: No  . Attends Archivist Meetings: Never  . Marital Status: Married    Tobacco Counseling Counseling given: Not Answered   Clinical Intake:  Pre-visit preparation completed: Yes  Pain : No/denies pain     Nutritional Risks: Nausea/ vomitting/ diarrhea (Diarrhea) Diabetes: No  How often do you need to have someone help you when you read instructions, pamphlets, or other written materials from your doctor or pharmacy?: 1 - Never  Diabetic?No   Interpreter Needed?: No  Information entered by :: [place] of Daily Living In your present state of health, do you have any difficulty performing the following activities: 11/18/2020  Hearing? Y  Vision? N  Difficulty  concentrating or making decisions? N  Walking or climbing stairs? N  Dressing or bathing? N  Doing errands, shopping? N  Preparing Food and eating ? N  Using the Toilet? N  In the past six months, have you accidently leaked urine? Y  Do you have problems with loss of bowel control? Y  Managing your Medications? N  Managing your Finances? N  Housekeeping or managing your Housekeeping? N  Some recent data might be hidden    Patient Care Team: Laurey Morale, MD as PCP - General  Indicate any recent Medical Services you may have received from other  than [facility] providers in the past year (date may be approximate).     Assessment:   This is a routine wellness examination for Khalis.  Hearing/Vision screen  Hearing Screening   125Hz  250Hz  500Hz  1000Hz  2000Hz  3000Hz  4000Hz  6000Hz  8000Hz   Right ear:           Left ear:           Vision Screening Comments: Patient states gets eyes examined once per year. Wears glasses   Dietary issues and exercise activities discussed: Current Exercise Habits: The patient does not participate in regular exercise at present, Exercise limited by: None identified  Goals    . Patient Stated     To get address the abd bloating issues    . Patient Stated     I would like to get a part time job.       Depression Screen PHQ 2/9 Scores 11/18/2020 02/07/2018 03/28/2016  PHQ - 2 Score 2 0 0  PHQ- 9 Score 9 - -    Fall Risk Fall Risk  11/18/2020 02/07/2018 03/28/2016  Falls in the past year? 1 No No  Number falls in past yr: 0 - -  Injury with Fall? 0 - -  Risk for fall due to : No Fall Risks - -  Follow up Falls evaluation completed;Falls prevention discussed - -    FALL RISK PREVENTION PERTAINING TO THE HOME:  Any stairs in or around the home? Yes  If so, are there any without handrails? No  Home free of loose throw rugs in walkways, pet beds, electrical cords, etc? Yes  Adequate lighting in your home to reduce risk of falls? Yes   ASSISTIVE  DEVICES UTILIZED TO PREVENT FALLS:  Life alert? No  Use of a cane, walker or w/c? No  Grab bars in the bathroom? No  Shower chair or bench in shower? No  Elevated toilet seat or a handicapped toilet? No   TIMED UP AND GO:  Was the test performed? Yes .  Length of time to ambulate 10 feet: 3 sec.   Gait steady and fast without use of assistive device  Cognitive Function:  Normal cognitive status assessed by direct observation by this Nurse Health Advisor. No abnormalities found.   MMSE - Mini Mental State Exam 02/07/2018  Not completed: (No Data)        Immunizations Immunization History  Administered Date(s) Administered  . Fluad Quad(high Dose 65+) 05/29/2019, 06/02/2020  . Influenza Split 08/03/2011  . Influenza Whole 09/05/2009, 06/09/2010  . Influenza, High Dose Seasonal PF 07/24/2018  . PFIZER(Purple Top)SARS-COV-2 Vaccination 11/05/2019, 12/02/2019, 07/23/2020  . Pneumococcal Polysaccharide-23 01/23/2019, 06/02/2020  . Td 09/06/2010  . Zoster 03/11/2017    TDAP status: Due, Education has been provided regarding the importance of this vaccine. Advised may receive this vaccine at local pharmacy or Health Dept. Aware to provide a copy of the vaccination record if obtained from local pharmacy or Health Dept. Verbalized acceptance and understanding.  Flu Vaccine status: Up to date  Pneumococcal vaccine status: Up to date  Covid-19 vaccine status: Completed vaccines  Qualifies for Shingles Vaccine? Yes   Zostavax completed Yes   Shingrix Completed?: No.    Education has been provided regarding the importance of this vaccine. Patient has been advised to call insurance company to determine out of pocket expense if they have not yet received this vaccine. Advised may also receive vaccine at local pharmacy or Health Dept. Verbalized acceptance and understanding.  Screening Tests  Health Maintenance  Topic Date Due  . COLONOSCOPY (Pts 45-33yrs Insurance coverage will  need to be confirmed)  02/18/2019  . TETANUS/TDAP  09/06/2020  . COVID-19 Vaccine (4 - Booster for Pfizer series) 01/20/2021  . PNA vac Low Risk Adult (2 of 2 - PCV13) 06/02/2021  . MAMMOGRAM  08/13/2021  . INFLUENZA VACCINE  Completed  . DEXA SCAN  Completed  . Hepatitis C Screening  Completed  . HPV VACCINES  Aged Out    Health Maintenance  Health Maintenance Due  Topic Date Due  . COLONOSCOPY (Pts 45-37yrs Insurance coverage will need to be confirmed)  02/18/2019  . TETANUS/TDAP  09/06/2020    Colorectal cancer screening: Referral to GI placed 11/18/2020. Pt aware the office will call re: appt.  Mammogram status: Completed 08/13/2020. Repeat every year  Bone Density status: Completed 08/04/2019. Results reflect: Bone density results: NORMAL. Repeat every 0 years.  Lung Cancer Screening: (Low Dose CT Chest recommended if Age 65-80 years, 30 pack-year currently smoking OR have quit w/in 15years.) does not qualify.   Lung Cancer Screening Referral: N/A   Additional Screening:  Hepatitis C Screening: does qualify; Completed 02/07/2018  Vision Screening: Recommended annual ophthalmology exams for early detection of glaucoma and other disorders of the eye. Is the patient up to date with their annual eye exam?  Yes  Who is the provider or what is the name of the office in which the patient attends annual eye exams? [facility] If pt is not established with a provider, would they like to be referred to a provider to establish care? No .   Dental Screening: Recommended annual dental exams for proper oral hygiene  Community Resource Referral / Chronic Care Management: CRR required this visit?  No   CCM required this visit?  No      Plan:     I have personally reviewed and noted the following in the patient's chart:   . Medical and social history . Use of alcohol, tobacco or illicit drugs  . Current medications and supplements . Functional ability and  status . Nutritional status . Physical activity . Advanced directives . List of other physicians . Hospitalizations, surgeries, and ER visits in previous 12 months . Vitals . Screenings to include cognitive, depression, and falls . Referrals and appointments  In addition, I have reviewed and discussed with patient certain preventive protocols, quality metrics, and best practice recommendations. A written personalized care plan for preventive services as well as general preventive health recommendations were provided to patient.     Ofilia Neas, LPN   [id number]   Nurse Notes: None

## 2020-11-18 ENCOUNTER — Ambulatory Visit (INDEPENDENT_AMBULATORY_CARE_PROVIDER_SITE_OTHER): Payer: Medicare Other

## 2020-11-18 ENCOUNTER — Other Ambulatory Visit: Payer: Self-pay

## 2020-11-18 VITALS — BP 148/80 | HR 71 | Temp 98.4°F | Ht 65.0 in | Wt 140.2 lb

## 2020-11-18 DIAGNOSIS — Z0000 Encounter for general adult medical examination without abnormal findings: Secondary | ICD-10-CM

## 2020-11-18 DIAGNOSIS — Z1211 Encounter for screening for malignant neoplasm of colon: Secondary | ICD-10-CM

## 2020-11-18 NOTE — Patient Instructions (Signed)
Ms. Salvaggio , Thank you for taking time to come for your Medicare Wellness Visit. I appreciate your ongoing commitment to your health goals. Please review the following plan we discussed and let me know if I can assist you in the future.   Screening recommendations/referrals: Colonoscopy: Currently due, orders placed this visit  Mammogram: Up to date, next due 08/13/2021 Bone Density:  No longer required  Recommended yearly ophthalmology/optometry visit for glaucoma screening and checkup Recommended yearly dental visit for hygiene and checkup  Vaccinations: Influenza vaccine: Up to date, next due fall 2022  Pneumococcal vaccine: Up to date, next due 06/02/2021 Tdap vaccine: Currently due, you may await and injury to receive  Shingles vaccine: Currently due for Shingrix, if you wish to receive we recommend that you do at your local pharmacy    Advanced directives: Please bring in a copy of your advanced medical directives so that we may scan into your chart.  Conditions/risks identified: None   Next appointment: None    Preventive Care 65 Years and Older, Female Preventive care refers to lifestyle choices and visits with your health care provider that can promote health and wellness. What does preventive care include?  A yearly physical exam. This is also called an annual well check.  Dental exams once or twice a year.  Routine eye exams. Ask your health care provider how often you should have your eyes checked.  Personal lifestyle choices, including:  Daily care of your teeth and gums.  Regular physical activity.  Eating a healthy diet.  Avoiding tobacco and drug use.  Limiting alcohol use.  Practicing safe sex.  Taking low-dose aspirin every day.  Taking vitamin and mineral supplements as recommended by your health care provider. What happens during an annual well check? The services and screenings done by your health care provider during your annual well check  will depend on your age, overall health, lifestyle risk factors, and family history of disease. Counseling  Your health care provider may ask you questions about your:  Alcohol use.  Tobacco use.  Drug use.  Emotional well-being.  Home and relationship well-being.  Sexual activity.  Eating habits.  History of falls.  Memory and ability to understand (cognition).  Work and work Statistician.  Reproductive health. Screening  You may have the following tests or measurements:  Height, weight, and BMI.  Blood pressure.  Lipid and cholesterol levels. These may be checked every 5 years, or more frequently if you are over 86 years old.  Skin check.  Lung cancer screening. You may have this screening every year starting at age 104 if you have a 30-pack-year history of smoking and currently smoke or have quit within the past 15 years.  Fecal occult blood test (FOBT) of the stool. You may have this test every year starting at age 41.  Flexible sigmoidoscopy or colonoscopy. You may have a sigmoidoscopy every 5 years or a colonoscopy every 10 years starting at age 48.  Hepatitis C blood test.  Hepatitis B blood test.  Sexually transmitted disease (STD) testing.  Diabetes screening. This is done by checking your blood sugar (glucose) after you have not eaten for a while (fasting). You may have this done every 1-3 years.  Bone density scan. This is done to screen for osteoporosis. You may have this done starting at age 94.  Mammogram. This may be done every 1-2 years. Talk to your health care provider about how often you should have regular mammograms. Talk with your  health care provider about your test results, treatment options, and if necessary, the need for more tests. Vaccines  Your health care provider may recommend certain vaccines, such as:  Influenza vaccine. This is recommended every year.  Tetanus, diphtheria, and acellular pertussis (Tdap, Td) vaccine. You may  need a Td booster every 10 years.  Zoster vaccine. You may need this after age 45.  Pneumococcal 13-valent conjugate (PCV13) vaccine. One dose is recommended after age 18.  Pneumococcal polysaccharide (PPSV23) vaccine. One dose is recommended after age 70. Talk to your health care provider about which screenings and vaccines you need and how often you need them. This information is not intended to replace advice given to you by your health care provider. Make sure you discuss any questions you have with your health care provider. Document Released: 09/23/2015 Document Revised: 05/16/2016 Document Reviewed: 06/28/2015 Elsevier Interactive Patient Education  2017 [place] Prevention in the Home Falls can cause injuries. They can happen to people of all ages. There are many things you can do to make your home safe and to help prevent falls. What can I do on the outside of my home?  Regularly fix the edges of walkways and driveways and fix any cracks.  Remove anything that might make you trip as you walk through a door, such as a raised step or threshold.  Trim any bushes or trees on the path to your home.  Use bright outdoor lighting.  Clear any walking paths of anything that might make someone trip, such as rocks or tools.  Regularly check to see if handrails are loose or broken. Make sure that both sides of any steps have handrails.  Any raised decks and porches should have guardrails on the edges.  Have any leaves, snow, or ice cleared regularly.  Use sand or salt on walking paths during winter.  Clean up any spills in your garage right away. This includes oil or grease spills. What can I do in the bathroom?  Use night lights.  Install grab bars by the toilet and in the tub and shower. Do not use towel bars as grab bars.  Use non-skid mats or decals in the tub or shower.  If you need to sit down in the shower, use a plastic, non-slip stool.  Keep the floor  dry. Clean up any water that spills on the floor as soon as it happens.  Remove soap buildup in the tub or shower regularly.  Attach bath mats securely with double-sided non-slip rug tape.  Do not have throw rugs and other things on the floor that can make you trip. What can I do in the bedroom?  Use night lights.  Make sure that you have a light by your bed that is easy to reach.  Do not use any sheets or blankets that are too big for your bed. They should not hang down onto the floor.  Have a firm chair that has side arms. You can use this for support while you get dressed.  Do not have throw rugs and other things on the floor that can make you trip. What can I do in the kitchen?  Clean up any spills right away.  Avoid walking on wet floors.  Keep items that you use a lot in easy-to-reach places.  If you need to reach something above you, use a strong step stool that has a grab bar.  Keep electrical cords out of the way.  Do not use  floor polish or wax that makes floors slippery. If you must use wax, use non-skid floor wax.  Do not have throw rugs and other things on the floor that can make you trip. What can I do with my stairs?  Do not leave any items on the stairs.  Make sure that there are handrails on both sides of the stairs and use them. Fix handrails that are broken or loose. Make sure that handrails are as long as the stairways.  Check any carpeting to make sure that it is firmly attached to the stairs. Fix any carpet that is loose or worn.  Avoid having throw rugs at the top or bottom of the stairs. If you do have throw rugs, attach them to the floor with carpet tape.  Make sure that you have a light switch at the top of the stairs and the bottom of the stairs. If you do not have them, ask someone to add them for you. What else can I do to help prevent falls?  Wear shoes that:  Do not have high heels.  Have rubber bottoms.  Are comfortable and fit you  well.  Are closed at the toe. Do not wear sandals.  If you use a stepladder:  Make sure that it is fully opened. Do not climb a closed stepladder.  Make sure that both sides of the stepladder are locked into place.  Ask someone to hold it for you, if possible.  Clearly mark and make sure that you can see:  Any grab bars or handrails.  First and last steps.  Where the edge of each step is.  Use tools that help you move around (mobility aids) if they are needed. These include:  Canes.  Walkers.  Scooters.  Crutches.  Turn on the lights when you go into a dark area. Replace any light bulbs as soon as they burn out.  Set up your furniture so you have a clear path. Avoid moving your furniture around.  If any of your floors are uneven, fix them.  If there are any pets around you, be aware of where they are.  Review your medicines with your doctor. Some medicines can make you feel dizzy. This can increase your chance of falling. Ask your doctor what other things that you can do to help prevent falls. This information is not intended to replace advice given to you by your health care provider. Make sure you discuss any questions you have with your health care provider. Document Released: 06/23/2009 Document Revised: 02/02/2016 Document Reviewed: 10/01/2014 Elsevier Interactive Patient Education  2017 [employer].

## 2020-12-12 ENCOUNTER — Other Ambulatory Visit: Payer: Self-pay | Admitting: Family Medicine

## 2020-12-12 MED ORDER — ZOLPIDEM TARTRATE 10 MG PO TABS
10.0000 mg | ORAL_TABLET | Freq: Every evening | ORAL | 1 refills | Status: DC | PRN
Start: 2020-12-12 — End: 2021-03-24

## 2020-12-12 NOTE — Telephone Encounter (Signed)
Can a refill be sent to [facility].    [facility] [place]

## 2020-12-20 ENCOUNTER — Telehealth: Payer: Self-pay

## 2020-12-20 NOTE — Telephone Encounter (Signed)
Pt Rx for Ambien was sent to pharmacy on 12/12/20

## 2020-12-21 NOTE — Telephone Encounter (Signed)
Please look into this

## 2020-12-21 NOTE — Telephone Encounter (Signed)
See message f rom patient

## 2021-01-03 MED ORDER — TEMAZEPAM 30 MG PO CAPS: 30.0000 mg | ORAL_CAPSULE | Freq: Every evening | ORAL | 2 refills | Status: DC | PRN

## 2021-01-03 NOTE — Telephone Encounter (Signed)
I sent in Temazepam for her to try

## 2021-01-03 NOTE — Addendum Note (Signed)
Addended by: Alysia Penna A on: 01/03/2021 02:32 PM   Modules accepted: Orders

## 2021-01-11 NOTE — Telephone Encounter (Signed)
See message f rom patient

## 2021-01-12 NOTE — Telephone Encounter (Signed)
Noted. The previous rx for Ambien should still be good at her pharmacy (it was sent in on 12-30-20)

## 2021-01-12 NOTE — Addendum Note (Signed)
Addended by: Alysia Penna A on: 01/12/2021 07:58 AM   Modules accepted: Orders

## 2021-02-11 ENCOUNTER — Other Ambulatory Visit: Payer: Self-pay | Admitting: Family Medicine

## 2021-02-15 ENCOUNTER — Other Ambulatory Visit: Payer: Self-pay | Admitting: Obstetrics & Gynecology

## 2021-02-15 NOTE — Telephone Encounter (Signed)
I called patient to see if she needs Rx at local pharmacy,. Rx was sent to mail order on 10/19/20. Patient said this was sent in error, she prefers to get patches at mail order. Rx denied.

## 2021-03-20 ENCOUNTER — Other Ambulatory Visit: Payer: Self-pay | Admitting: Family Medicine

## 2021-03-20 NOTE — Telephone Encounter (Signed)
She already has a refill available

## 2021-03-23 ENCOUNTER — Telehealth: Payer: Self-pay | Admitting: Family Medicine

## 2021-03-23 ENCOUNTER — Other Ambulatory Visit: Payer: Self-pay | Admitting: Family Medicine

## 2021-03-23 NOTE — Telephone Encounter (Signed)
Called [phone] 3x's unable to get through to patient.   Refill on file at [facility] for Zolpidem 10mg , sent My Chart message to patient to make aware.

## 2021-03-23 NOTE — Telephone Encounter (Signed)
Patient is trying to get Zolpidum Tartrate refilled, however it was denied.  She is requesting a call back.  Patient is about to run out.

## 2021-03-24 ENCOUNTER — Other Ambulatory Visit: Payer: Self-pay

## 2021-03-24 ENCOUNTER — Ambulatory Visit (INDEPENDENT_AMBULATORY_CARE_PROVIDER_SITE_OTHER): Payer: Medicare Other

## 2021-03-24 DIAGNOSIS — Z23 Encounter for immunization: Secondary | ICD-10-CM

## 2021-03-24 MED ORDER — ZOLPIDEM TARTRATE 10 MG PO TABS: 10.0000 mg | ORAL_TABLET | Freq: Every evening | ORAL | 1 refills | Status: DC | PRN

## 2021-03-24 NOTE — Telephone Encounter (Signed)
[facility]  [facility] will not fill the prescription, so she will pay out of pocket for the refill.  Okay to reply by MyChart.

## 2021-03-24 NOTE — Progress Notes (Signed)
Per orders of Dr. Sarajane Jews, injection of Shingrix vaccine given by Franco Collet. Patient tolerated injection well.

## 2021-03-28 ENCOUNTER — Other Ambulatory Visit: Payer: Self-pay

## 2021-03-28 MED ORDER — ZOLPIDEM TARTRATE 10 MG PO TABS: 10.0000 mg | ORAL_TABLET | Freq: Every evening | ORAL | 1 refills | Status: DC | PRN

## 2021-03-28 NOTE — Telephone Encounter (Signed)
Last ov 06/21/20 Schedule appt 04/19/21  [facility] [address] [facility] TO [facility] Phone:  [phone]  Fax:  [phone]

## 2021-04-04 ENCOUNTER — Encounter: Payer: Self-pay | Admitting: Gastroenterology

## 2021-04-05 ENCOUNTER — Other Ambulatory Visit: Payer: Self-pay

## 2021-04-05 ENCOUNTER — Telehealth: Payer: Self-pay

## 2021-04-05 MED ORDER — ZOLPIDEM TARTRATE 10 MG PO TABS: 10.0000 mg | ORAL_TABLET | Freq: Every evening | ORAL | 1 refills | Status: DC | PRN

## 2021-04-05 NOTE — Telephone Encounter (Signed)
Done

## 2021-04-05 NOTE — Addendum Note (Signed)
Addended by: Alysia Penna A on: 04/05/2021 08:39 AM   Modules accepted: Orders

## 2021-04-19 ENCOUNTER — Ambulatory Visit: Payer: Medicare Other | Admitting: Family Medicine

## 2021-04-25 ENCOUNTER — Other Ambulatory Visit: Payer: Self-pay

## 2021-04-25 ENCOUNTER — Ambulatory Visit (AMBULATORY_SURGERY_CENTER): Payer: Medicare Other | Admitting: *Deleted

## 2021-04-25 VITALS — Ht 65.0 in | Wt 140.0 lb

## 2021-04-25 DIAGNOSIS — Z86010 Personal history of colonic polyps: Secondary | ICD-10-CM

## 2021-04-25 MED ORDER — PLENVU 140 G PO SOLR: 1.0000 | ORAL | 0 refills | Status: DC

## 2021-04-25 NOTE — Progress Notes (Signed)
No egg or soy allergy known to patient  No issues with past sedation with any surgeries or procedures Patient denies ever being told they had issues or difficulty with intubation  No FH of Malignant Hyperthermia No diet pills per patient No home 02 use per patient  No blood thinners per patient  Pt denies issues with constipation  No A fib or A flutter  EMMI video to pt or via MyChart  COVID 19 guidelines implemented in PV today with Pt and RN  Pt is fully vaccinated  for Covid   Plenvu Medicare  Coupon given to pt in [place] today , Activated Coupon  and mailed to pt in packet and  NO PA's for preps discussed with pt In PV today  Discussed with pt there will be an out-of-pocket cost for prep and that varies from $0 to 70 dollars   Due to the COVID-19 pandemic we are asking patients to follow certain guidelines.  Pt aware of COVID protocols and [facility] guidelines   Pt verified name, DOB, address and insurance during PV today. Pt mailed instruction packet to included paper to complete and mail back to [facility] with addressed and stamped envelope, Emmi video, copy of consent form to read and not return, and instructions. Plenvu coupon mailed in packet. PV completed over the phone. Pt encouraged to call with questions or issues.  My Chart instructions to pt as well

## 2021-04-29 ENCOUNTER — Other Ambulatory Visit: Payer: Self-pay | Admitting: Family Medicine

## 2021-05-03 ENCOUNTER — Telehealth: Payer: Self-pay | Admitting: Gastroenterology

## 2021-05-03 NOTE — Telephone Encounter (Signed)
Hi Dr. Silverio Decamp, this patient just called to cancel procedure that was scheduled on 05/10/21 because she has an emergency and needs to go out of town. Patient will call back to reschedule. Thank you.

## 2021-05-03 NOTE — Telephone Encounter (Signed)
ok 

## 2021-05-10 ENCOUNTER — Encounter: Payer: Medicare Other | Admitting: Gastroenterology

## 2021-05-28 ENCOUNTER — Encounter: Payer: Self-pay | Admitting: Gastroenterology

## 2021-06-02 ENCOUNTER — Other Ambulatory Visit: Payer: Self-pay

## 2021-06-02 ENCOUNTER — Ambulatory Visit
Admission: EM | Admit: 2021-06-02 | Discharge: 2021-06-02 | Disposition: A | Discharge status: Home / Self Care | Payer: Medicare Other | Attending: Family Medicine | Admitting: Family Medicine

## 2021-06-02 DIAGNOSIS — L03032 Cellulitis of left toe: Secondary | ICD-10-CM

## 2021-06-02 MED ORDER — DOXYCYCLINE HYCLATE 100 MG PO CAPS: 100.0000 mg | ORAL_CAPSULE | Freq: Two times a day (BID) | ORAL | 0 refills | Status: DC

## 2021-06-02 NOTE — ED Provider Notes (Signed)
[facility]    CSN: [id number] Arrival date & time: 06/02/21  1217      History   Chief Complaint Chief Complaint  Patient presents with   Toe Pain    HPI Melody Sutton is a 70 y.o. female.  Patient complains of redness of her left second toe.  No history of any injury.  There is no itching.  Patient questions possible insect bite.  There is no surrounding erythema or appearance of fungal infection between the toes.  She does have plantar warts on the bottom of both feet.  There is no history of gout.  HPI  Past Medical History:  Diagnosis Date   Allergy    CIN III (cervical intraepithelial neoplasia grade III) with severe dysplasia    Dysmenorrhea    GERD (gastroesophageal reflux disease)    on occasion only   Granuloma annulare    Hyperlipidemia    Hypertension    Ovarian cyst     Patient Active Problem List   Diagnosis Date Noted   History of cervical dysplasia 01/29/2017   Menopausal state 09/22/2012   CIN III (cervical intraepithelial neoplasia grade III) with severe dysplasia    Dysmenorrhea    Ovarian cyst    Granuloma annulare    LIPOMA 06/09/2010   LESION, FACE 06/09/2010   FOOT PAIN, BILATERAL 06/09/2010   Hyperlipemia, mixed 08/28/2007   Essential hypertension 08/28/2007   ALLERGIC RHINITIS 08/28/2007   INSOMNIA 08/28/2007   HEADACHE 08/28/2007    Past Surgical History:  Procedure Laterality Date   COLONOSCOPY  02/17/2014   per Dr. Olevia Perches, adenomatous polyps, repeat in 7 yrs    FOOT SURGERY Left    NASAL SINUS SURGERY     30+ yeras ago   OVARIAN CYST SURGERY     POLYPECTOMY     TA+   TUBAL LIGATION     VAGINAL HYSTERECTOMY      OB History     Gravida  0   Para      Term      Preterm      AB  0   Living  0      SAB      IAB      Ectopic      Multiple      Live Births               Home Medications    Prior to Admission medications   Medication Sig Start Date End Date Taking? Authorizing  Provider  doxycycline (VIBRAMYCIN) 100 MG capsule Take 1 capsule (100 mg total) by mouth 2 (two) times daily. 06/02/21  Yes Wardell Honour, MD  acetaminophen (TYLENOL) 500 MG tablet Take 500 mg by mouth every 6 (six) hours as needed.    [provider]  atenolol-chlorthalidone (TENORETIC) 50-25 MG tablet TAKE 1 TABLET DAILY 05/01/21   Laurey Morale, MD  atorvastatin (LIPITOR) 40 MG tablet TAKE 1 TABLET DAILY 06/21/20   Laurey Morale, MD  Calcium-Magnesium-Vitamin D (CALCIUM 1200+D3 PO) Take by mouth.    [provider]  Cyanocobalamin (VITAMIN B 12 PO) Take 1,000 mcg by mouth daily. Patient not taking: Reported on 04/25/2021    [provider]  estradiol (VIVELLE-DOT) 0.025 MG/24HR Place 1 patch onto the skin 2 (two) times a week. 10/20/20   Princess Bruins, MD  Famotidine-Ca Carb-Mag Hydrox (PEPCID COMPLETE PO) Take by mouth. Dual Action PRN    [provider]  losartan (COZAAR) 50  MG tablet Take 1 tablet (50 mg total) by mouth daily. 06/21/20   Laurey Morale, MD  meloxicam (MOBIC) 15 MG tablet Take 1 tablet (15 mg total) by mouth daily. 06/21/20   Laurey Morale, MD  OVER THE COUNTER MEDICATION [facility] brand 24-hour nondrowsy allergy med daily equivalent to Claritin    [provider]  PEG-KCl-NaCl-NaSulf-Na Asc-C (PLENVU) 140 g SOLR Take 1 kit by mouth as directed. NO PA's- pt has a Plenvu Medicare Coupon 04/25/21   Nandigam, Venia Minks, MD  potassium chloride SA (KLOR-CON) 20 MEQ tablet Take 1 tablet (20 mEq total) by mouth daily. 06/21/20   Laurey Morale, MD  psyllium (METAMUCIL SMOOTH TEXTURE) 28 % packet Take 1 packet by mouth 2 (two) times daily. As needed    [provider]  pyridOXINE (VITAMIN B-6) 100 MG tablet Take 100 mg by mouth daily.    [provider]  vitamin E 1000 UNIT capsule Take 1 capsule (1,000 Units total) by mouth daily. Patient not taking: Reported on 04/25/2021 12/22/14   Laurey Morale, MD  zolpidem  (AMBIEN) 10 MG tablet Take 1 tablet (10 mg total) by mouth at bedtime as needed for sleep. 04/05/21 05/05/21  Laurey Morale, MD    Family History Family History  Problem Relation Age of Onset   Hypertension Mother    Breast cancer Maternal Aunt    Cancer Maternal Aunt        Vulvar Ca in situ   Diabetes Maternal Uncle    Colon cancer Neg Hx    Pancreatic cancer Neg Hx    Stomach cancer Neg Hx    Colon polyps Neg Hx    Esophageal cancer Neg Hx    Rectal cancer Neg Hx     Social History Social History   Tobacco Use   Smoking status: Former    Packs/day: 1.00    Years: 32.00    Pack years: 32.00    Types: Cigarettes    Quit date: 06/11/1991    Years since quitting: 29.9   Smokeless tobacco: Never  Vaping Use   Vaping Use: Never used  Substance Use Topics   Alcohol use: Yes    Alcohol/week: 7.0 standard drinks    Types: 7 Glasses of wine per week    Comment: daily ( 2)    Drug use: No     Allergies   Patient has no known allergies.   Review of Systems Review of Systems  Skin:  Positive for color change.       Area involved his left second toe  All other systems reviewed and are negative.   Physical Exam Triage Vital Signs ED Triage Vitals  Enc Vitals Group     BP 06/02/21 1259 (!) 155/74     Pulse Rate 06/02/21 1259 66     Resp 06/02/21 1259 18     Temp 06/02/21 1259 99.3 F (37.4 C)     Temp Source 06/02/21 1259 Oral     SpO2 06/02/21 1259 98 %     Weight --      Height --      Head Circumference --      Peak Flow --      Pain Score 06/02/21 1258 0     Pain Loc --      Pain Edu? --      Excl. in [place]? --    No data found.  Updated Vital Signs BP (!) 155/74 (BP Location:  Left Arm)   Pulse 66   Temp 99.3 F (37.4 C) (Oral)   Resp 18   LMP 09/10/1978   SpO2 98%   Visual Acuity Right Eye Distance:   Left Eye Distance:   Bilateral Distance:    Right Eye Near:   Left Eye Near:    Bilateral Near:     Physical Exam Vitals and nursing note  reviewed.  Constitutional:      Appearance: Normal appearance.  Cardiovascular:     Rate and Rhythm: Normal rate.  Pulmonary:     Effort: Pulmonary effort is normal.  Skin:    Comments: Left second toe was red and warm to touch.  There is a tiny blister at the base there is some tenderness especially when she wears shoes.  Neurological:     Mental Status: She is alert.     UC Treatments / Results  Labs (all labs ordered are listed, but only abnormal results are displayed) Labs Reviewed - No data to display  EKG   Radiology No results found.  Procedures Procedures (including critical care time)  Medications Ordered in UC Medications - No data to display  Initial Impression / Assessment and Plan / UC Course  I have reviewed the triage vital signs and the nursing notes.  Pertinent labs & imaging results that were available during my care of the patient were reviewed by me and considered in my medical decision making (see chart for details).     Erythema left second toe most likely represents cellulitis.  Will treat with Dooxycycline Final Clinical Impressions(s) / UC Diagnoses   Final diagnoses:  Cellulitis of toe of left foot     Discharge Instructions      Elevate foot when possible   ED Prescriptions     Medication Sig Dispense Auth. Provider   doxycycline (VIBRAMYCIN) 100 MG capsule Take 1 capsule (100 mg total) by mouth 2 (two) times daily. 20 capsule Wardell Honour, MD      PDMP not reviewed this encounter.   Wardell Honour, MD 06/02/21 1341

## 2021-06-02 NOTE — Discharge Instructions (Addendum)
Elevate foot when possible

## 2021-06-02 NOTE — ED Triage Notes (Signed)
Pt reports having irritation to left second toe (started Wednesday).  Area is inflamed and swollen, skin in intact.

## 2021-07-03 ENCOUNTER — Other Ambulatory Visit: Payer: Self-pay | Admitting: Family Medicine

## 2021-07-24 ENCOUNTER — Encounter: Payer: Self-pay | Admitting: Family Medicine

## 2021-07-25 ENCOUNTER — Other Ambulatory Visit: Payer: Self-pay

## 2021-07-25 MED ORDER — POTASSIUM CHLORIDE CRYS ER 20 MEQ PO TBCR: 20.0000 meq | EXTENDED_RELEASE_TABLET | Freq: Every day | ORAL | 0 refills | Status: DC

## 2021-07-25 MED ORDER — LOSARTAN POTASSIUM 50 MG PO TABS: 50.0000 mg | ORAL_TABLET | Freq: Every day | ORAL | 0 refills | Status: DC

## 2021-07-31 ENCOUNTER — Ambulatory Visit: Payer: Medicare Other | Admitting: Family Medicine

## 2021-08-09 ENCOUNTER — Ambulatory Visit (INDEPENDENT_AMBULATORY_CARE_PROVIDER_SITE_OTHER): Payer: Medicare Other | Admitting: Family Medicine

## 2021-08-09 ENCOUNTER — Encounter: Payer: Self-pay | Admitting: Family Medicine

## 2021-08-09 ENCOUNTER — Other Ambulatory Visit: Payer: Self-pay | Admitting: Family Medicine

## 2021-08-09 VITALS — BP 140/84 | HR 65 | Temp 98.6°F | Wt 146.1 lb

## 2021-08-09 DIAGNOSIS — I10 Essential (primary) hypertension: Secondary | ICD-10-CM

## 2021-08-09 DIAGNOSIS — E782 Mixed hyperlipidemia: Secondary | ICD-10-CM

## 2021-08-09 DIAGNOSIS — R739 Hyperglycemia, unspecified: Secondary | ICD-10-CM

## 2021-08-09 DIAGNOSIS — F5101 Primary insomnia: Secondary | ICD-10-CM | POA: Diagnosis not present

## 2021-08-09 LAB — LIPID PANEL
Cholesterol: 190 mg/dL (ref 0–200)
HDL: 93.8 mg/dL (ref >39.00)
LDL Cholesterol: 84 mg/dL (ref 0–99)
NonHDL: 96.24
Total CHOL/HDL Ratio: 2
Triglycerides: 60 mg/dL (ref 0.0–149.0)
VLDL: 12 mg/dL (ref 0.0–40.0)

## 2021-08-09 LAB — HEPATIC FUNCTION PANEL
ALT: 26 U/L (ref 0–35)
AST: 30 U/L (ref 0–37)
Albumin: 4.3 g/dL (ref 3.5–5.2)
Alkaline Phosphatase: 52 U/L (ref 39–117)
Bilirubin, Direct: 0.1 mg/dL (ref 0.0–0.3)
Total Bilirubin: 0.7 mg/dL (ref 0.2–1.2)
Total Protein: 6.9 g/dL (ref 6.0–8.3)

## 2021-08-09 LAB — BASIC METABOLIC PANEL
BUN: 15 mg/dL (ref 6–23)
CO2: 30 mEq/L (ref 19–32)
Calcium: 9.6 mg/dL (ref 8.4–10.5)
Chloride: 99 mEq/L (ref 96–112)
Creatinine, Ser: 0.67 mg/dL (ref 0.40–1.20)
GFR: 88.05 mL/min (ref >60.00)
Glucose, Bld: 90 mg/dL (ref 70–99)
Potassium: 4.3 mEq/L (ref 3.5–5.1)
Sodium: 134 mEq/L — ABNORMAL LOW (ref 135–145)

## 2021-08-09 LAB — CBC WITH DIFFERENTIAL/PLATELET
Basophils Absolute: 0.1 10*3/uL (ref 0.0–0.1)
Basophils Relative: 0.7 % (ref 0.0–3.0)
Eosinophils Absolute: 0.5 10*3/uL (ref 0.0–0.7)
Eosinophils Relative: 5.3 % — ABNORMAL HIGH (ref 0.0–5.0)
HCT: 38.7 % (ref 36.0–46.0)
Hemoglobin: 12.6 g/dL (ref 12.0–15.0)
Lymphocytes Relative: 20 % (ref 12.0–46.0)
Lymphs Abs: 2 10*3/uL (ref 0.7–4.0)
MCHC: 32.6 g/dL (ref 30.0–36.0)
MCV: 85.8 fl (ref 78.0–100.0)
Monocytes Absolute: 1 10*3/uL (ref 0.1–1.0)
Monocytes Relative: 10.3 % (ref 3.0–12.0)
Neutro Abs: 6.2 10*3/uL (ref 1.4–7.7)
Neutrophils Relative %: 63.7 % (ref 43.0–77.0)
Platelets: 243 10*3/uL (ref 150.0–400.0)
RBC: 4.5 Mil/uL (ref 3.87–5.11)
RDW: 14.9 % (ref 11.5–15.5)
WBC: 9.8 10*3/uL (ref 4.0–10.5)

## 2021-08-09 LAB — HEMOGLOBIN A1C: Hgb A1c MFr Bld: 5.8 % (ref 4.6–6.5)

## 2021-08-09 LAB — TSH: TSH: 1.24 u[IU]/mL (ref 0.35–5.50)

## 2021-08-09 MED ORDER — ATORVASTATIN CALCIUM 40 MG PO TABS: 40.0000 mg | ORAL_TABLET | Freq: Every day | ORAL | 3 refills | Status: DC

## 2021-08-09 MED ORDER — LOSARTAN POTASSIUM 50 MG PO TABS: 50.0000 mg | ORAL_TABLET | Freq: Every day | ORAL | 3 refills | Status: DC

## 2021-08-09 MED ORDER — ZOLPIDEM TARTRATE 10 MG PO TABS: 10.0000 mg | ORAL_TABLET | Freq: Every evening | ORAL | 1 refills | Status: DC | PRN

## 2021-08-09 MED ORDER — MELOXICAM 15 MG PO TABS: 15.0000 mg | ORAL_TABLET | Freq: Every day | ORAL | 3 refills | Status: DC

## 2021-08-09 NOTE — Progress Notes (Signed)
   Subjective:    Patient ID: Melody Sutton, female    DOB: June 16, 1951, 70 y.o.   MRN: [id number]  HPI Here to follow up. She feels great. Her BP has been stable at home. She is fasting today.    Review of Systems  Constitutional: Negative.   Respiratory: Negative.    Cardiovascular: Negative.       Objective:   Physical Exam Constitutional:      Appearance: Normal appearance.  Cardiovascular:     Rate and Rhythm: Normal rate and regular rhythm.     Pulses: Normal pulses.     Heart sounds: Normal heart sounds.  Pulmonary:     Effort: Pulmonary effort is normal.     Breath sounds: Normal breath sounds.  Neurological:     Mental Status: She is alert.          Assessment & Plan:  Her HTN and insomnia are well controlled. We will send her for labs today to check lipids, etc.  Alysia Penna, MD

## 2021-08-14 DIAGNOSIS — Z1231 Encounter for screening mammogram for malignant neoplasm of breast: Secondary | ICD-10-CM | POA: Diagnosis not present

## 2021-08-14 LAB — HM MAMMOGRAPHY

## 2021-08-17 ENCOUNTER — Encounter: Payer: Self-pay | Admitting: Family Medicine

## 2021-09-10 DIAGNOSIS — H269 Unspecified cataract: Secondary | ICD-10-CM

## 2021-09-10 HISTORY — PX: CATARACT EXTRACTION: SUR2

## 2021-09-10 HISTORY — DX: Unspecified cataract: H26.9

## 2021-10-17 ENCOUNTER — Other Ambulatory Visit: Payer: Self-pay | Admitting: Family Medicine

## 2021-10-17 NOTE — Telephone Encounter (Signed)
Last refill- 08/09/21-90 tabs, 1 refill Last OV-08/09/21  Message below  from patient. Patient comment: Dr. Sarajane Jews, Please state that this medication is needed by me.  Alternatives in the past have not worked.  [facility] did deny this prescription previously.  Thank you  **Dr. Sarajane Jews I will complete a Prior authorization for this patient, but I only found one medication patient tried it was Temazepam,  please advise of another medication.  Insurance plans require two.   Thanks.

## 2021-10-17 NOTE — Telephone Encounter (Signed)
She has also tried Diazepam for sleep with poor results

## 2021-10-18 ENCOUNTER — Ambulatory Visit (INDEPENDENT_AMBULATORY_CARE_PROVIDER_SITE_OTHER): Payer: Medicare Other

## 2021-10-18 ENCOUNTER — Other Ambulatory Visit: Payer: Self-pay

## 2021-10-18 ENCOUNTER — Encounter: Payer: Self-pay | Admitting: Family Medicine

## 2021-10-18 ENCOUNTER — Ambulatory Visit (INDEPENDENT_AMBULATORY_CARE_PROVIDER_SITE_OTHER): Payer: Medicare Other | Admitting: Family Medicine

## 2021-10-18 VITALS — BP 108/64 | HR 88 | Temp 98.5°F | Wt 141.1 lb

## 2021-10-18 DIAGNOSIS — S20221A Contusion of right back wall of thorax, initial encounter: Secondary | ICD-10-CM

## 2021-10-18 DIAGNOSIS — I700 Atherosclerosis of aorta: Secondary | ICD-10-CM | POA: Diagnosis not present

## 2021-10-18 DIAGNOSIS — R0781 Pleurodynia: Secondary | ICD-10-CM | POA: Diagnosis not present

## 2021-10-18 DIAGNOSIS — R079 Chest pain, unspecified: Secondary | ICD-10-CM | POA: Diagnosis not present

## 2021-10-18 MED ORDER — TRAMADOL HCL 50 MG PO TABS: 100.0000 mg | ORAL_TABLET | Freq: Four times a day (QID) | ORAL | 0 refills | Status: DC | PRN

## 2021-10-18 MED ORDER — CYCLOBENZAPRINE HCL 10 MG PO TABS: 10.0000 mg | ORAL_TABLET | Freq: Three times a day (TID) | ORAL | 1 refills | Status: DC | PRN

## 2021-10-18 NOTE — Progress Notes (Signed)
° °  Subjective:    Patient ID: Melody Sutton, female    DOB: Jan 22, 1951, 71 y.o.   MRN: [id number]  HPI Here for injuries from a fall at home that occurred on 10-12-21. While walking across her living room she lost her balance and she fell, striking her right side and back against a table. No head injuries or LOC. Since then she has had a lot of pain in the right lateral ribs and in the right upper back. No SOB. No hematuria. Taking Tylenol with little relief.    Review of Systems  Constitutional: Negative.   Respiratory: Negative.    Cardiovascular:  Positive for chest pain. Negative for palpitations and leg swelling.  Gastrointestinal: Negative.   Genitourinary: Negative.   Musculoskeletal:  Positive for back pain.      Objective:   Physical Exam Constitutional:      Appearance: Normal appearance.     Comments: In mild pain when getting up or down from a chair   Cardiovascular:     Rate and Rhythm: Normal rate and regular rhythm.     Pulses: Normal pulses.     Heart sounds: Normal heart sounds.  Pulmonary:     Effort: Pulmonary effort is normal.     Breath sounds: Normal breath sounds.  Musculoskeletal:     Comments: There is a large area of ecchymosis on the right flank at the superior pelvis. She is tender over the right upper back and the right lateral ribs. No crepitus. He spine has full ROM   Neurological:     General: No focal deficit present.     Mental Status: She is alert. Mental status is at baseline.          Assessment & Plan:  Contusions to the upper back and right ribs. Use Tramadol for pain. We will get Xrays of these areas to rule out fractures.  Alysia Penna, MD

## 2021-10-19 DIAGNOSIS — H2513 Age-related nuclear cataract, bilateral: Secondary | ICD-10-CM | POA: Diagnosis not present

## 2021-10-19 DIAGNOSIS — H25013 Cortical age-related cataract, bilateral: Secondary | ICD-10-CM | POA: Diagnosis not present

## 2021-10-19 DIAGNOSIS — H18413 Arcus senilis, bilateral: Secondary | ICD-10-CM | POA: Diagnosis not present

## 2021-10-19 DIAGNOSIS — H18513 Endothelial corneal dystrophy, bilateral: Secondary | ICD-10-CM | POA: Diagnosis not present

## 2021-10-19 DIAGNOSIS — H2512 Age-related nuclear cataract, left eye: Secondary | ICD-10-CM | POA: Diagnosis not present

## 2021-10-19 DIAGNOSIS — H25043 Posterior subcapsular polar age-related cataract, bilateral: Secondary | ICD-10-CM | POA: Diagnosis not present

## 2021-10-19 NOTE — Telephone Encounter (Signed)
Prior authorization completed for Zolpidem 10mg  by phone.   Determination will be faxed to office.

## 2021-10-20 ENCOUNTER — Ambulatory Visit: Payer: Medicare Other | Admitting: Obstetrics & Gynecology

## 2021-10-20 ENCOUNTER — Encounter: Payer: Self-pay | Admitting: Family Medicine

## 2021-10-23 ENCOUNTER — Encounter: Payer: Self-pay | Admitting: Obstetrics & Gynecology

## 2021-10-23 ENCOUNTER — Other Ambulatory Visit: Payer: Self-pay

## 2021-10-23 ENCOUNTER — Ambulatory Visit (INDEPENDENT_AMBULATORY_CARE_PROVIDER_SITE_OTHER): Payer: Medicare Other | Admitting: Obstetrics & Gynecology

## 2021-10-23 VITALS — BP 132/88 | HR 62 | Ht 65.0 in | Wt 144.0 lb

## 2021-10-23 DIAGNOSIS — D069 Carcinoma in situ of cervix, unspecified: Secondary | ICD-10-CM | POA: Diagnosis not present

## 2021-10-23 DIAGNOSIS — Z01419 Encounter for gynecological examination (general) (routine) without abnormal findings: Secondary | ICD-10-CM | POA: Diagnosis not present

## 2021-10-23 DIAGNOSIS — Z79890 Hormone replacement therapy (postmenopausal): Secondary | ICD-10-CM

## 2021-10-23 DIAGNOSIS — Z9189 Other specified personal risk factors, not elsewhere classified: Secondary | ICD-10-CM | POA: Diagnosis not present

## 2021-10-23 DIAGNOSIS — Z90710 Acquired absence of both cervix and uterus: Secondary | ICD-10-CM

## 2021-10-23 MED ORDER — ESTRADIOL 0.025 MG/24HR TD PTTW: 1.0000 | MEDICATED_PATCH | TRANSDERMAL | 4 refills | Status: DC

## 2021-10-23 NOTE — Progress Notes (Signed)
Melody Sutton [birth year] [id number]   History:    71 y.o. G0 Married.  Enjoys birds.   RP:  Established patient presenting for annual gyn exam    HPI: S/P Vaginal Hysterectomy for CIN 3.  Pap tests normal since Hysterectomy, last Pap 10/2020.  Menopause syndrome on Estradiol patch decreased to 0.025 weekly.  Still having hot flushes.  No pelvic pain. No pain with intercourse. Breast normal.  Mammo Neg 08/2021.  Body mass index 23.96.  Physically active.  BD normal in 07/2019.  Health labs with family physician.  Colono 02/2014, will schedule Colono this year.    Past medical history,surgical history, family history and social history were all reviewed and documented in the EPIC chart.  Gynecologic History Patient's last menstrual period was 09/10/1978.  Obstetric History OB History  Gravida Para Term Preterm AB Living  0       0 0  SAB IAB Ectopic Multiple Live Births                ROS: A ROS was performed and pertinent positives and negatives are included in the history.  GENERAL: No fevers or chills. HEENT: No change in vision, no earache, sore throat or sinus congestion. NECK: No pain or stiffness. CARDIOVASCULAR: No chest pain or pressure. No palpitations. PULMONARY: No shortness of breath, cough or wheeze. GASTROINTESTINAL: No abdominal pain, nausea, vomiting or diarrhea, melena or bright red blood per rectum. GENITOURINARY: No urinary frequency, urgency, hesitancy or dysuria. MUSCULOSKELETAL: No joint or muscle pain, no back pain, no recent trauma. DERMATOLOGIC: No rash, no itching, no lesions. ENDOCRINE: No polyuria, polydipsia, no heat or cold intolerance. No recent change in weight. HEMATOLOGICAL: No anemia or easy bruising or bleeding. NEUROLOGIC: No headache, seizures, numbness, tingling or weakness. PSYCHIATRIC: No depression, no loss of interest in normal activity or change in sleep pattern.     Exam:   BP 132/88    Pulse 62    Ht 5\' 5"  (1.651 m)    Wt 144 lb (65.3  kg)    LMP 09/10/1978    SpO2 98%    BMI 23.96 kg/m   Body mass index is 23.96 kg/m.  General appearance : Well developed well nourished female. No acute distress HEENT: Eyes: no retinal hemorrhage or exudates,  Neck supple, trachea midline, no carotid bruits, no thyroidmegaly Lungs: Clear to auscultation, no rhonchi or wheezes, or rib retractions  Heart: Regular rate and rhythm, no murmurs or gallops Breast:Examined in sitting and supine position were symmetrical in appearance, no palpable masses or tenderness,  no skin retraction, no nipple inversion, no nipple discharge, no skin discoloration, no axillary or supraclavicular lymphadenopathy Abdomen: no palpable masses or tenderness, no rebound or guarding Extremities: no edema or skin discoloration or tenderness  Pelvic: Vulva: Normal             Vagina: No gross lesions or discharge  Cervix/Uterus absent  Adnexa  Without masses or tenderness  Anus: Normal   Assessment/Plan:  71 y.o. female for annual exam   1. Well female exam with routine gynecological exam S/P Vaginal Hysterectomy for CIN 3.  Pap tests normal since, last Pap 10/2020.  Menopause syndrome on Estradiol patch decreased to 0.025 weekly.  Still having hot flushes.  No pelvic pain. No pain with intercourse. Breast normal.  Mammo Neg 08/2021.  Body mass index 23.96.  Physically active.  BD normal in 07/2019.  Health labs with family physician.  Last Colono 2015, will schedule  Colono this year.  2. At risk for infection  3. H/O total hysterectomy  4. Severe cervical dysplasia, histologically confirmed Pap Neg x Hysterectomy.  Last Pap 10/2020.  5. Post-menopause on HRT (hormone replacement therapy) Risks of HRT outweighing the benefits at this poin.  On Estradiol 0.025 twice a week.  Will wean and stop HRT in the course of the year.  BD normal in 07/2019.  Other orders - estradiol (VIVELLE-DOT) 0.025 MG/24HR; Place 1 patch onto the skin 2 (two) times a week. Recommend  weaning and stopping HRT in the course of the year.   Princess Bruins MD, 1:48 PM 10/23/2021

## 2021-10-25 NOTE — Telephone Encounter (Addendum)
Called [facility] will fax form for next steps for appeal process

## 2021-10-30 ENCOUNTER — Encounter: Payer: Self-pay | Admitting: Obstetrics & Gynecology

## 2021-11-03 NOTE — Telephone Encounter (Signed)
Could you call [facility] and see what they need?

## 2021-11-07 NOTE — Telephone Encounter (Signed)
Called [facility].   Appeal for Zolpidem was approved in 10/19/21.   Approval # is [id number].  [facility], will ship medication out to patient 90 day supply for $15.   Called patient aware of situation

## 2021-11-09 ENCOUNTER — Telehealth: Payer: Self-pay | Admitting: Family Medicine

## 2021-11-09 NOTE — Telephone Encounter (Signed)
Patient has my chart video schedule for 11/10/21 ?

## 2021-11-09 NOTE — Telephone Encounter (Signed)
Patient calling in with respiratory symptoms: ?Shortness of breath, chest pain, palpitations or other red words send to Triage ? ?Does the patient have a fever over 100, cough, congestion, sore throat, runny nose, lost of taste/smell (please list symptoms that patient has)? Fever, chills, headache, cough ? ?What date did symptoms start? 11/07/21 ?(If over 5 days ago, pt may be scheduled for in person visit) ? ?Have you tested for Covid in the last 5 days? Yes  ? ?If yes, was it positive [x]  OR negative [] ? If positive in the last 5 days, please schedule virtual visit now. If negative, schedule for an in person OV with the next available provider if PCP has no openings. Please also let patient know they will be tested again (follow the script below) ? ?"you will have to arrive 47mins prior to your appt time to be Covid tested. Please park in back of office at the cone & call [phone] to let the staff know you have arrived. A staff member will meet you at your car to do a rapid covid test. Once the test has resulted you will be notified by phone of your results to determine if appt will remain an in person visit or be converted to a virtual/phone visit. If you arrive less than 24mins before your appt time, your visit will be automatically converted to virtual & any recommended testing will happen AFTER the visit." ? ? ?THINGS TO REMEMBER ? ?If no availability for virtual visit in office,  please schedule another [place] office ? ?If no availability at another [place] office, please instruct patient that they can schedule an evisit or virtual visit through their mychart account. Visits up to 8pm ? ?patients can be seen in office 5 days after positive COVID test ? ?  ?

## 2021-11-10 ENCOUNTER — Encounter: Payer: Self-pay | Admitting: Family Medicine

## 2021-11-10 ENCOUNTER — Telehealth (INDEPENDENT_AMBULATORY_CARE_PROVIDER_SITE_OTHER): Payer: Medicare Other | Admitting: Family Medicine

## 2021-11-10 DIAGNOSIS — U071 COVID-19: Secondary | ICD-10-CM | POA: Insufficient documentation

## 2021-11-10 MED ORDER — MOLNUPIRAVIR EUA 200MG CAPSULE: 4.0000 | ORAL_CAPSULE | Freq: Two times a day (BID) | ORAL | 0 refills | Status: AC

## 2021-11-10 NOTE — Progress Notes (Signed)
? ?  Subjective:  ? ? Patient ID: Melody Sutton, female    DOB: June 05, 1951, 71 y.o.   MRN: [id number] ? ?HPI ?Virtual Visit via Telephone Note ? ?I connected with the patient on 11/10/21 at  1:15 PM EST by telephone and verified that I am speaking with the correct person using two identifiers. ?  ?I discussed the limitations, risks, security and privacy concerns of performing an evaluation and management service by telephone and the availability of in person appointments. I also discussed with the patient that there may be a patient responsible charge related to this service. The patient expressed understanding and agreed to proceed. ? ?Location patient: home ?Location provider: work or home office ?Participants present for the call: patient, provider ?Patient did not have a visit in the prior 7 days to address this/these issue(s). ? ? ?History of Present Illness: ?Here for a Covid-19 infection. About 4 days ago she developed fever, headache, body aches, cough, and diarrhea. No chest pain or SOB. Using Tylenol. She tested positive for the Covid virus (along with her husband) last night.  ?  ?Observations/Objective: ?Patient sounds cheerful and well on the phone. ?I do not appreciate any SOB. ?Speech and thought processing are grossly intact. ?Patient reported vitals: ? ?Assessment and Plan: ?Covid infection. Treat with 5 days of Molnupiravir. They well quarantine for 5 days.  ?Alysia Penna, MD ? ? ?Follow Up Instructions: ? ? ? ? ?27782 5-10 ?[id number] 11-20 ?9443 21-30 ?I did not refer this patient for an OV in the next 24 hours for this/these issue(s). ? ?I discussed the assessment and treatment plan with the patient. The patient was provided an opportunity to ask questions and all were answered. The patient agreed with the plan and demonstrated an understanding of the instructions. ?  ?The patient was advised to call back or seek an in-person evaluation if the symptoms worsen or if the condition fails to improve as  anticipated. ? ?I provided  17 minutes of non-face-to-face time during this encounter. ? ? ?Alysia Penna, MD   ? ? ?Review of Systems ? ?   ?Objective:  ? Physical Exam ? ? ? ? ?   ?Assessment & Plan:  ? ? ?

## 2021-11-20 ENCOUNTER — Telehealth: Payer: Self-pay | Admitting: Family Medicine

## 2021-11-20 NOTE — Telephone Encounter (Signed)
Left message for patient to call back and schedule Medicare Annual Wellness Visit (AWV) either virtually or in office. Left  my Melody Sutton number [phone] ? ? ?Last AWV 11/18/20 ?please schedule at anytime with [facility] Nurse Health Advisor 1 or 2 ? ? ?This should be a 45 minute visit.  ?

## 2021-12-20 DIAGNOSIS — H2512 Age-related nuclear cataract, left eye: Secondary | ICD-10-CM | POA: Diagnosis not present

## 2021-12-21 ENCOUNTER — Telehealth: Payer: Self-pay | Admitting: Family Medicine

## 2021-12-21 DIAGNOSIS — H2511 Age-related nuclear cataract, right eye: Secondary | ICD-10-CM | POA: Diagnosis not present

## 2021-12-21 NOTE — Telephone Encounter (Signed)
Spoke with patient to schedule Medicare Annual Wellness Visit (AWV) either virtually or in office. ? ?She needed to check her calendar and will call back ? ?Last AWV 11/18/20; please schedule at anytime with [facility] Nurse Health Advisor 1 or 2 ? ? ? ?

## 2022-01-03 DIAGNOSIS — H2511 Age-related nuclear cataract, right eye: Secondary | ICD-10-CM | POA: Diagnosis not present

## 2022-01-29 ENCOUNTER — Other Ambulatory Visit: Payer: Self-pay | Admitting: Family Medicine

## 2022-02-16 ENCOUNTER — Ambulatory Visit (INDEPENDENT_AMBULATORY_CARE_PROVIDER_SITE_OTHER): Payer: Medicare Other

## 2022-02-16 VITALS — BP 140/62 | HR 66 | Temp 98.2°F | Ht 65.0 in | Wt 144.4 lb

## 2022-02-16 DIAGNOSIS — Z0000 Encounter for general adult medical examination without abnormal findings: Secondary | ICD-10-CM

## 2022-02-16 NOTE — Progress Notes (Signed)
Subjective:   Melody Sutton is a 71 y.o. female who presents for Medicare Annual (Subsequent) preventive examination.  Review of Systems     Cardiac Risk Factors include: advanced age (>50mn, >>71women);hypertension     Objective:    Today's Vitals   02/16/22 1113  BP: 140/62  Pulse: 66  Temp: 98.2 F (36.8 C)  SpO2: 97%  Weight: 144 lb 6.4 oz (65.5 kg)  Height: '5\' 5"'$  (1.651 m)   Body mass index is 24.03 kg/m.     02/16/2022   11:36 AM 11/18/2020    2:20 PM 02/07/2018    4:36 PM 02/03/2014    4:37 PM  Advanced Directives  Does Patient Have a Medical Advance Directive? Yes Yes Yes Patient has advance directive, copy not in chart  Type of Advance Directive [place] will [place] will    Does patient want to make changes to medical advance directive? No - Patient declined No - Patient declined  No change requested  Copy of [place] Chart? No - copy requested No - copy requested    Pre-existing out of facility DNR order (yellow form or pink MOST form)    No    Current Medications (verified) Outpatient Encounter Medications as of 02/16/2022  Medication Sig   acetaminophen (TYLENOL) 500 MG tablet Take 500 mg by mouth every 6 (six) hours as needed.   atenolol-chlorthalidone (TENORETIC) 50-25 MG tablet TAKE 1 TABLET DAILY   atorvastatin (LIPITOR) 40 MG tablet Take 1 tablet (40 mg total) by mouth daily.   Calcium-Magnesium-Vitamin D (CALCIUM 1200+D3 PO) Take by mouth.   Cyanocobalamin (VITAMIN B 12 PO) Take 1,000 mcg by mouth daily.   cyclobenzaprine (FLEXERIL) 10 MG tablet Take 1 tablet (10 mg total) by mouth 3 (three) times daily as needed for muscle spasms.   estradiol (VIVELLE-DOT) 0.025 MG/24HR Place 1 patch onto the skin 2 (two) times a week. Recommend weaning and stopping HRT in the course of the year.   KLOR-CON M20 20 MEQ tablet TAKE 1 TABLET DAILY   losartan (COZAAR) 50 MG tablet Take 1  tablet (50 mg total) by mouth daily.   meloxicam (MOBIC) 15 MG tablet Take 1 tablet (15 mg total) by mouth daily.   OVER THE COUNTER MEDICATION [facility] brand 24-hour nondrowsy allergy med daily equivalent to Claritin   psyllium (METAMUCIL SMOOTH TEXTURE) 28 % packet Take 1 packet by mouth 2 (two) times daily. As needed   pyridOXINE (VITAMIN B-6) 100 MG tablet Take 100 mg by mouth daily.   traMADol (ULTRAM) 50 MG tablet Take 2 tablets (100 mg total) by mouth every 6 (six) hours as needed.   zolpidem (AMBIEN) 10 MG tablet Take 1 tablet (10 mg total) by mouth at bedtime as needed for sleep.   No facility-administered encounter medications on file as of 02/16/2022.    Allergies (verified) Patient has no known allergies.   History: Past Medical History:  Diagnosis Date   Allergy    CIN III (cervical intraepithelial neoplasia grade III) with severe dysplasia    Dysmenorrhea    GERD (gastroesophageal reflux disease)    on occasion only   Granuloma annulare    Hyperlipidemia    Hypertension    Ovarian cyst    Past Surgical History:  Procedure Laterality Date   COLONOSCOPY  02/17/2014   per Dr. BOlevia Perches adenomatous polyps, repeat in 7 yrs    FOOT SURGERY Left    NASAL  SINUS SURGERY     30+ yeras ago   OVARIAN CYST SURGERY     POLYPECTOMY     TA+   TUBAL LIGATION     VAGINAL HYSTERECTOMY     Family History  Problem Relation Age of Onset   Hypertension Mother    Breast cancer Maternal Aunt    Cancer Maternal Aunt        Vulvar Ca in situ   Diabetes Maternal Uncle    Colon cancer Neg Hx    Pancreatic cancer Neg Hx    Stomach cancer Neg Hx    Colon polyps Neg Hx    Esophageal cancer Neg Hx    Rectal cancer Neg Hx    Social History   Socioeconomic History   Marital status: Married    Spouse name: Not on file   Number of children: Not on file   Years of education: Not on file   Highest education level: Some college, no degree  Occupational History   Not on file   Tobacco Use   Smoking status: Former    Packs/day: 1.00    Years: 32.00    Total pack years: 32.00    Types: Cigarettes    Quit date: 06/11/1991    Years since quitting: 30.7   Smokeless tobacco: Never  Vaping Use   Vaping Use: Never used  Substance and Sexual Activity   Alcohol use: Yes    Alcohol/week: 7.0 standard drinks of alcohol    Types: 7 Glasses of wine per week    Comment: daily ( 2)    Drug use: No   Sexual activity: Yes    Partners: Male    Birth control/protection: Surgical    Comment: 1st intercourse- 16, partners-  68, married- 58 yrs   Other Topics Concern   Not on file  Social History Narrative   Not on file   Social Determinants of Health   Financial Resource Strain: Low Risk  (02/16/2022)   Overall Financial Resource Strain (CARDIA)    Difficulty of Paying Living Expenses: Not hard at all  Food Insecurity: No Food Insecurity (02/16/2022)   Hunger Vital Sign    Worried About Running Out of Food in the Last Year: Never true    [place] in the Last Year: Never true  Transportation Needs: Unknown (02/16/2022)   PRAPARE - Transportation    Lack of Transportation (Medical): No    Lack of Transportation (Non-Medical): Patient refused  Physical Activity: Unknown (02/16/2022)   Exercise Vital Sign    Days of Exercise per Week: Patient refused    Minutes of Exercise per Session: Patient refused  Stress: Unknown (02/16/2022)   [place] of Stress : Patient refused  Social Connections: Unknown (02/16/2022)   Social Connection and Isolation Panel [NHANES]    Frequency of Communication with Friends and Family: Patient refused    Frequency of Social Gatherings with Friends and Family: Patient refused    Attends Religious Services: Never    Marine scientist or Organizations: Patient refused    Attends Archivist Meetings: Patient refused    Marital Status: Married      Clinical Intake:  How often do you need to have someone help you when you read instructions, pamphlets, or other written materials from your doctor or pharmacy?: 1 - Never  Diabetic?  No  Interpreter Needed?: NoActivities of Daily Living  02/16/2022   11:33 AM 02/15/2022   12:28 PM  In your present state of health, do you have any difficulty performing the following activities:  Hearing? 0 0  Vision? 0 0  Difficulty concentrating or making decisions? 0 0  Walking or climbing stairs? 0 0  Dressing or bathing? 0 0  Doing errands, shopping? 0 0  Preparing Food and eating ? N N  Using the Toilet? N N  In the past six months, have you accidently leaked urine? N N  Do you have problems with loss of bowel control? N N  Managing your Medications? N N  Managing your Finances? N N  Housekeeping or managing your Housekeeping? N N    Patient Care Team: Laurey Morale, MD as PCP - General  Indicate any recent Medical Services you may have received from other than [facility] providers in the past year (date may be approximate).     Assessment:   This is a routine wellness examination for Simra.  Hearing/Vision screen Hearing Screening - Comments:: No hearing difficulty Vision Screening - Comments:: Wears reading glasses. Followed by Dr Gwynn Burly  Dietary issues and exercise activities discussed: Exercise limited by: None identified   Goals Addressed               This Visit's Progress     Increase physical activity (pt-stated)         Depression Screen    02/16/2022   11:30 AM 10/18/2021    4:08 PM 08/09/2021   11:23 AM 11/18/2020    2:27 PM 02/07/2018    4:38 PM 03/28/2016   10:20 AM  PHQ 2/9 Scores  PHQ - 2 Score 0 0 0 2 0 0  PHQ- 9 Score  3 0 9      Fall Risk    02/16/2022   11:33 AM 02/15/2022   12:28 PM 10/18/2021    4:09 PM 10/18/2021   10:52 AM 08/09/2021   11:24 AM  Fall Risk   Falls in the past year? '1 1 1 1 '$ 0  Number falls in past yr: 0  0 0 0  Injury with  Fall? 0  1 1 0  Comment Bruising to rt side back. Followed by PCP  bruise on Left rib cage    Risk for fall due to : No Fall Risks  No Fall Risks  No Fall Risks  Follow up   Falls evaluation completed;Follow up appointment;Falls prevention discussed      FALL RISK PREVENTION PERTAINING TO THE HOME:  Any stairs in or around the home? Yes  If so, are there any without handrails? No  Home free of loose throw rugs in walkways, pet beds, electrical cords, etc? Yes  Adequate lighting in your home to reduce risk of falls? Yes   ASSISTIVE DEVICES UTILIZED TO PREVENT FALLS:  Life alert? No  Use of a cane, walker or w/c? No  Grab bars in the bathroom? No  Shower chair or bench in shower? No  Elevated toilet seat or a handicapped toilet? No   TIMED UP AND GO:  Was the test performed? Yes .  Length of time to ambulate 10 feet: 5 sec.   Gait steady and fast without use of assistive device  Cognitive Function:        02/16/2022   11:36 AM  6CIT Screen  What Year? 0 points  What month? 0 points  What time? 0 points  Count back from 20 0 points  Months in reverse 0 points  Repeat phrase 0 points  Total Score 0 points    Immunizations Immunization History  Administered Date(s) Administered   Fluad Quad(high Dose 65+) 05/29/2019, 06/02/2020   Influenza Split 08/03/2011   Influenza Whole 09/05/2009, 06/09/2010   Influenza, High Dose Seasonal PF 07/24/2018   PFIZER(Purple Top)SARS-COV-2 Vaccination 11/05/2019, 12/02/2019, 07/23/2020   Pneumococcal Polysaccharide-23 01/23/2019, 06/02/2020   Td 09/06/2010   Zoster Recombinat (Shingrix) 03/24/2021   Zoster, Live 03/11/2017    TDAP status: Due, Education has been provided regarding the importance of this vaccine. Advised may receive this vaccine at local pharmacy or Health Dept. Aware to provide a copy of the vaccination record if obtained from local pharmacy or Health Dept. Verbalized acceptance and understanding.    Pneumococcal  vaccine status: Due, Education has been provided regarding the importance of this vaccine. Advised may receive this vaccine at local pharmacy or Health Dept. Aware to provide a copy of the vaccination record if obtained from local pharmacy or Health Dept. Verbalized acceptance and understanding.  Covid-19 vaccine status: Completed vaccines  Qualifies for Shingles Vaccine? Yes   Zostavax completed Yes   Shingrix Completed?: Yes  Screening Tests Health Maintenance  Topic Date Due   COVID-19 Vaccine (4 - Booster for Pfizer series) 03/04/2022 (Originally 09/17/2020)   Zoster Vaccines- Shingrix (2 of 2) 05/19/2022 (Originally 05/19/2021)   Pneumonia Vaccine 21+ Years old (2 - PCV) 02/17/2023 (Originally 06/02/2021)   TETANUS/TDAP  02/17/2023 (Originally 09/06/2020)   INFLUENZA VACCINE  04/10/2022   MAMMOGRAM  08/14/2022   COLONOSCOPY (Pts 45-13yr Insurance coverage will need to be confirmed)  05/10/2026   DEXA SCAN  Completed   Hepatitis C Screening  Completed   HPV VACCINES  Aged Out    Health Maintenance  There are no preventive care reminders to display for this patient.   Colorectal cancer screening: Type of screening: Colonoscopy. Completed 05/10/21. Repeat every 5 years  Mammogram status: Completed 08/14/21. Repeat every year  Bone Density status: Completed 08/04/19. Results reflect: Bone density results: OSTEOPOROSIS. Repeat every 2 years.  Lung Cancer Screening: (Low Dose CT Chest recommended if Age 72-80years, 30 pack-year currently smoking OR have quit w/in 15years.) does not qualify.     Additional Screening:  Hepatitis C Screening: does qualify; Completed 02/07/18  Vision Screening: Recommended annual ophthalmology exams for early detection of glaucoma and other disorders of the eye. Is the patient up to date with their annual eye exam?  Yes  Who is the provider or what is the name of the office in which the patient attends annual eye exams? Dr TGwynn BurlyIf pt is not  established with a provider, would they like to be referred to a provider to establish care? No .   Dental Screening: Recommended annual dental exams for proper oral hygiene  Community Resource Referral / Chronic Care Management:  CRR required this visit?  No   CCM required this visit?  No      Plan:     I have personally reviewed and noted the following in the patient's chart:   Medical and social history Use of alcohol, tobacco or illicit drugs  Current medications and supplements including opioid prescriptions.  Functional ability and status Nutritional status Physical activity Advanced directives List of other physicians Hospitalizations, surgeries, and ER visits in previous 12 months Vitals Screenings to include cognitive, depression, and falls Referrals and appointments  In addition, I have reviewed and discussed with patient certain preventive protocols, quality metrics, and  best practice recommendations. A written personalized care plan for preventive services as well as general preventive health recommendations were provided to patient.     Criselda Peaches, LPN   0/11/129   Nurse Notes: None

## 2022-02-16 NOTE — Patient Instructions (Addendum)
Melody Sutton , Thank you for taking time to come for your Medicare Wellness Visit. I appreciate your ongoing commitment to your health goals. Please review the following plan we discussed and let me know if I can assist you in the future.   These are the goals we discussed:  Goals       Increase physical activity (pt-stated)      Patient Stated      To get address the abd bloating issues      Patient Stated      I would like to get a part time job.         This is a list of the screening recommended for you and due dates:  Health Maintenance  Topic Date Due   COVID-19 Vaccine (4 - Booster for Pfizer series) 03/04/2022*   Zoster (Shingles) Vaccine (2 of 2) 05/19/2022*   Pneumonia Vaccine (2 - PCV) 02/17/2023*   Tetanus Vaccine  02/17/2023*   Flu Shot  04/10/2022   Mammogram  08/14/2022   Colon Cancer Screening  05/10/2026   DEXA scan (bone density measurement)  Completed   Hepatitis C Screening: USPSTF Recommendation to screen - Ages 23-79 yo.  Completed   HPV Vaccine  Aged Out  *Topic was postponed. The date shown is not the original due date.   Opioid Pain Medicine Management Opioids are powerful medicines that are used to treat moderate to severe pain. When used for short periods of time, they can help you to: Sleep better. Do better in physical or occupational therapy. Feel better in the first few days after an injury. Recover from surgery. Opioids should be taken with the supervision of a trained health care provider. They should be taken for the shortest period of time possible. This is because opioids can be addictive, and the longer you take opioids, the greater your risk of addiction. This addiction can also be called opioid use disorder. What are the risks? Using opioid pain medicines for longer than 3 days increases your risk of side effects. Side effects include: Constipation. Nausea and vomiting. Breathing difficulties (respiratory  depression). Drowsiness. Confusion. Opioid use disorder. Itching. Taking opioid pain medicine for a long period of time can affect your ability to do daily tasks. It also puts you at risk for: Motor vehicle crashes. Depression. Suicide. Heart attack. Overdose, which can be life-threatening. What is a pain treatment plan? A pain treatment plan is an agreement between you and your health care provider. Pain is unique to each person, and treatments vary depending on your condition. To manage your pain, you and your health care provider need to work together. To help you do this: Discuss the goals of your treatment, including how much pain you might expect to have and how you will manage the pain. Review the risks and benefits of taking opioid medicines. Remember that a good treatment plan uses more than one approach and minimizes the chance of side effects. Be honest about the amount of medicines you take and about any drug or alcohol use. Get pain medicine prescriptions from only one health care provider. Pain can be managed with many types of alternative treatments. Ask your health care provider to refer you to one or more specialists who can help you manage pain through: Physical or occupational therapy. Counseling (cognitive behavioral therapy). Good nutrition. Biofeedback. Massage. Meditation. Non-opioid medicine. Following a gentle exercise program. How to use opioid pain medicine Taking medicine Take your pain medicine exactly as told by your  health care provider. Take it only when you need it. If your pain gets less severe, you may take less than your prescribed dose if your health care provider approves. If you are not having pain, do nottake pain medicine unless your health care provider tells you to take it. If your pain is severe, do nottry to treat it yourself by taking more pills than instructed on your prescription. Contact your health care provider for help. Write down  the times when you take your pain medicine. It is easy to become confused while on pain medicine. Writing the time can help you avoid overdose. Take other over-the-counter or prescription medicines only as told by your health care provider. Keeping yourself and others safe  While you are taking opioid pain medicine: Do not drive, use machinery, or power tools. Do not sign legal documents. Do not drink alcohol. Do not take sleeping pills. Do not supervise children by yourself. Do not do activities that require climbing or being in high places. Do not go to a lake, river, ocean, spa, or swimming pool. Do not share your pain medicine with anyone. Keep pain medicine in a locked cabinet or in a secure area where pets and children cannot reach it. Stopping your use of opioids If you have been taking opioid medicine for more than a few weeks, you may need to slowly decrease (taper) how much you take until you stop completely. Tapering your use of opioids can decrease your risk of symptoms of withdrawal, such as: Pain and cramping in the abdomen. Nausea. Sweating. Sleepiness. Restlessness. Uncontrollable shaking (tremors). Cravings for the medicine. Do not attempt to taper your use of opioids on your own. Talk with your health care provider about how to do this. Your health care provider may prescribe a step-down schedule based on how much medicine you are taking and how long you have been taking it. Getting rid of leftover pills Do not save any leftover pills. Get rid of leftover pills safely by: Taking the medicine to a prescription take-back program. This is usually offered by the county or law enforcement. Bringing them to a pharmacy that has a drug disposal container. Flushing them down the toilet. Check the label or package insert of your medicine to see whether this is safe to do. Throwing them out in the trash. Check the label or package insert of your medicine to see whether this is  safe to do. If it is safe to throw it out, remove the medicine from the original container, put it into a sealable bag or container, and mix it with used coffee grounds, food scraps, dirt, or cat litter before putting it in the trash. Follow these instructions at home: Activity Do exercises as told by your health care provider. Avoid activities that make your pain worse. Return to your normal activities as told by your health care provider. Ask your health care provider what activities are safe for you. General instructions You may need to take these actions to prevent or treat constipation: Drink enough fluid to keep your urine pale yellow. Take over-the-counter or prescription medicines. Eat foods that are high in fiber, such as beans, whole grains, and fresh fruits and vegetables. Limit foods that are high in fat and processed sugars, such as fried or sweet foods. Keep all follow-up visits. This is important. Where to find support If you have been taking opioids for a long time, you may benefit from receiving support for quitting from a local support group  or counselor. Ask your health care provider for a referral to these resources in your area. Where to find more information Centers for Disease Control and Prevention (CDC): http://www.wolf.info/ [place] Food and Drug Administration (FDA): [place] Get help right away if: You may have taken too much of an opioid (overdosed). Common symptoms of an overdose: Your breathing is slower or more shallow than normal. You have a very slow heartbeat (pulse). You have slurred speech. You have nausea and vomiting. Your pupils become very small. You have other potential symptoms: You are very confused. You faint or feel like you will faint. You have cold, clammy skin. You have blue lips or fingernails. You have thoughts of harming yourself or harming others. These symptoms may represent a serious problem that is an emergency. Do not wait to see if the  symptoms will go away. Get medical help right away. Call your local emergency services (911 in the [place] Do not drive yourself to the hospital.  If you ever feel like you may hurt yourself or others, or have thoughts about taking your own life, get help right away. Go to your nearest emergency department or: Call your local emergency services (911 in the [place] Call the [facility] (226) 223-7637 in the [place] Call a suicide crisis helpline, such as the [place] at 774-863-0897 or 988 in the [place]. This is open 24 hours a day in the [place] Text the Crisis Text Line at (681)578-3725 (in the [place].). Summary Opioid medicines can help you manage moderate to severe pain for a short period of time. A pain treatment plan is an agreement between you and your health care provider. Discuss the goals of your treatment, including how much pain you might expect to have and how you will manage the pain. If you think that you or someone else may have taken too much of an opioid, get medical help right away. This information is not intended to replace advice given to you by your health care provider. Make sure you discuss any questions you have with your health care provider. Document Revised: 03/22/2021 Document Reviewed: 12/07/2020 Elsevier Patient Education  [place] directives: Yes Patient will submit copy  Conditions/risks identified: None  Next appointment: Follow up in one year for your annual wellness visit     Preventive Care 65 Years and Older, Female Preventive care refers to lifestyle choices and visits with your health care provider that can promote health and wellness. What does preventive care include? A yearly physical exam. This is also called an annual well check. Dental exams once or twice a year. Routine eye exams. Ask your health care provider how often you should have your eyes checked. Personal lifestyle choices,  including: Daily care of your teeth and gums. Regular physical activity. Eating a healthy diet. Avoiding tobacco and drug use. Limiting alcohol use. Practicing safe sex. Taking low-dose aspirin every day. Taking vitamin and mineral supplements as recommended by your health care provider. What happens during an annual well check? The services and screenings done by your health care provider during your annual well check will depend on your age, overall health, lifestyle risk factors, and family history of disease. Counseling  Your health care provider may ask you questions about your: Alcohol use. Tobacco use. Drug use. Emotional well-being. Home and relationship well-being. Sexual activity. Eating habits. History of falls. Memory and ability to understand (cognition). Work and work Statistician. Reproductive health. Screening  You may  have the following tests or measurements: Height, weight, and BMI. Blood pressure. Lipid and cholesterol levels. These may be checked every 5 years, or more frequently if you are over 55 years old. Skin check. Lung cancer screening. You may have this screening every year starting at age 68 if you have a 30-pack-year history of smoking and currently smoke or have quit within the past 15 years. Fecal occult blood test (FOBT) of the stool. You may have this test every year starting at age 51. Flexible sigmoidoscopy or colonoscopy. You may have a sigmoidoscopy every 5 years or a colonoscopy every 10 years starting at age 23. Hepatitis C blood test. Hepatitis B blood test. Sexually transmitted disease (STD) testing. Diabetes screening. This is done by checking your blood sugar (glucose) after you have not eaten for a while (fasting). You may have this done every 1-3 years. Bone density scan. This is done to screen for osteoporosis. You may have this done starting at age 45. Mammogram. This may be done every 1-2 years. Talk to your health care provider  about how often you should have regular mammograms. Talk with your health care provider about your test results, treatment options, and if necessary, the need for more tests. Vaccines  Your health care provider may recommend certain vaccines, such as: Influenza vaccine. This is recommended every year. Tetanus, diphtheria, and acellular pertussis (Tdap, Td) vaccine. You may need a Td booster every 10 years. Zoster vaccine. You may need this after age 28. Pneumococcal 13-valent conjugate (PCV13) vaccine. One dose is recommended after age 51. Pneumococcal polysaccharide (PPSV23) vaccine. One dose is recommended after age 22. Talk to your health care provider about which screenings and vaccines you need and how often you need them. This information is not intended to replace advice given to you by your health care provider. Make sure you discuss any questions you have with your health care provider. Document Released: 09/23/2015 Document Revised: 05/16/2016 Document Reviewed: 06/28/2015 Elsevier Interactive Patient Education  2017 [place] Prevention in the Home Falls can cause injuries. They can happen to people of all ages. There are many things you can do to make your home safe and to help prevent falls. What can I do on the outside of my home? Regularly fix the edges of walkways and driveways and fix any cracks. Remove anything that might make you trip as you walk through a door, such as a raised step or threshold. Trim any bushes or trees on the path to your home. Use bright outdoor lighting. Clear any walking paths of anything that might make someone trip, such as rocks or tools. Regularly check to see if handrails are loose or broken. Make sure that both sides of any steps have handrails. Any raised decks and porches should have guardrails on the edges. Have any leaves, snow, or ice cleared regularly. Use sand or salt on walking paths during winter. Clean up any spills in  your garage right away. This includes oil or grease spills. What can I do in the bathroom? Use night lights. Install grab bars by the toilet and in the tub and shower. Do not use towel bars as grab bars. Use non-skid mats or decals in the tub or shower. If you need to sit down in the shower, use a plastic, non-slip stool. Keep the floor dry. Clean up any water that spills on the floor as soon as it happens. Remove soap buildup in the tub or shower regularly. Attach bath  mats securely with double-sided non-slip rug tape. Do not have throw rugs and other things on the floor that can make you trip. What can I do in the bedroom? Use night lights. Make sure that you have a light by your bed that is easy to reach. Do not use any sheets or blankets that are too big for your bed. They should not hang down onto the floor. Have a firm chair that has side arms. You can use this for support while you get dressed. Do not have throw rugs and other things on the floor that can make you trip. What can I do in the kitchen? Clean up any spills right away. Avoid walking on wet floors. Keep items that you use a lot in easy-to-reach places. If you need to reach something above you, use a strong step stool that has a grab bar. Keep electrical cords out of the way. Do not use floor polish or wax that makes floors slippery. If you must use wax, use non-skid floor wax. Do not have throw rugs and other things on the floor that can make you trip. What can I do with my stairs? Do not leave any items on the stairs. Make sure that there are handrails on both sides of the stairs and use them. Fix handrails that are broken or loose. Make sure that handrails are as long as the stairways. Check any carpeting to make sure that it is firmly attached to the stairs. Fix any carpet that is loose or worn. Avoid having throw rugs at the top or bottom of the stairs. If you do have throw rugs, attach them to the floor with carpet  tape. Make sure that you have a light switch at the top of the stairs and the bottom of the stairs. If you do not have them, ask someone to add them for you. What else can I do to help prevent falls? Wear shoes that: Do not have high heels. Have rubber bottoms. Are comfortable and fit you well. Are closed at the toe. Do not wear sandals. If you use a stepladder: Make sure that it is fully opened. Do not climb a closed stepladder. Make sure that both sides of the stepladder are locked into place. Ask someone to hold it for you, if possible. Clearly mark and make sure that you can see: Any grab bars or handrails. First and last steps. Where the edge of each step is. Use tools that help you move around (mobility aids) if they are needed. These include: Canes. Walkers. Scooters. Crutches. Turn on the lights when you go into a dark area. Replace any light bulbs as soon as they burn out. Set up your furniture so you have a clear path. Avoid moving your furniture around. If any of your floors are uneven, fix them. If there are any pets around you, be aware of where they are. Review your medicines with your doctor. Some medicines can make you feel dizzy. This can increase your chance of falling. Ask your doctor what other things that you can do to help prevent falls. This information is not intended to replace advice given to you by your health care provider. Make sure you discuss any questions you have with your health care provider. Document Released: 06/23/2009 Document Revised: 02/02/2016 Document Reviewed: 10/01/2014 Elsevier Interactive Patient Education  2017 [employer].

## 2022-04-22 ENCOUNTER — Other Ambulatory Visit: Payer: Self-pay | Admitting: Family Medicine

## 2022-04-22 DIAGNOSIS — I10 Essential (primary) hypertension: Secondary | ICD-10-CM

## 2022-05-29 ENCOUNTER — Other Ambulatory Visit: Payer: Self-pay | Admitting: Family Medicine

## 2022-06-12 ENCOUNTER — Other Ambulatory Visit: Payer: Self-pay | Admitting: Family Medicine

## 2022-06-13 MED ORDER — ZOLPIDEM TARTRATE 10 MG PO TABS: 10.0000 mg | ORAL_TABLET | Freq: Every evening | ORAL | 1 refills | Status: DC | PRN

## 2022-06-13 NOTE — Telephone Encounter (Signed)
I sent in the refills, but I cannot specify what form the pills come in. This is likely determined by what generic company makes them

## 2022-06-13 NOTE — Telephone Encounter (Signed)
Message below from the Patient.  Pt referring to tablets.  Last OV-10/18/21    Patient comment: I received the tablets last time, the caplets work better because I break off a bit and don't take the whole pill every night.

## 2022-07-08 ENCOUNTER — Other Ambulatory Visit: Payer: Self-pay | Admitting: Family Medicine

## 2022-07-16 ENCOUNTER — Other Ambulatory Visit: Payer: Self-pay | Admitting: Family Medicine

## 2022-07-16 DIAGNOSIS — I10 Essential (primary) hypertension: Secondary | ICD-10-CM

## 2022-07-18 ENCOUNTER — Encounter: Payer: Self-pay | Admitting: Internal Medicine

## 2022-07-18 ENCOUNTER — Ambulatory Visit (INDEPENDENT_AMBULATORY_CARE_PROVIDER_SITE_OTHER): Payer: Medicare Other | Admitting: Internal Medicine

## 2022-07-18 VITALS — BP 172/98 | HR 70 | Temp 98.9°F | Wt 139.6 lb

## 2022-07-18 DIAGNOSIS — J029 Acute pharyngitis, unspecified: Secondary | ICD-10-CM | POA: Diagnosis not present

## 2022-07-18 DIAGNOSIS — J020 Streptococcal pharyngitis: Secondary | ICD-10-CM | POA: Diagnosis not present

## 2022-07-18 LAB — POC COVID19 BINAXNOW: SARS Coronavirus 2 Ag: NEGATIVE

## 2022-07-18 LAB — POCT RAPID STREP A (OFFICE): Rapid Strep A Screen: POSITIVE — AB

## 2022-07-18 MED ORDER — AMOXICILLIN 500 MG PO TABS: 500.0000 mg | ORAL_TABLET | Freq: Two times a day (BID) | ORAL | 0 refills | Status: AC

## 2022-07-18 NOTE — Progress Notes (Signed)
Acute office Visit     CC/Reason for Visit: Sore throat  HPI: Melody Sutton is a 71 y.o. female who is coming in today for the above mentioned reasons.  For the past 2-1/2 days she has been having an extremely sore throat "feels like my mouth is on fire".  It is very difficult to swallow.  She has also been having a lot of postnasal drip and drainage.  She has not taken her temperature but has noted significant night sweats and diaphoresis.   Past Medical/Surgical History: Past Medical History:  Diagnosis Date   Allergy    CIN III (cervical intraepithelial neoplasia grade III) with severe dysplasia    Dysmenorrhea    GERD (gastroesophageal reflux disease)    on occasion only   Granuloma annulare    Hyperlipidemia    Hypertension    Ovarian cyst     Past Surgical History:  Procedure Laterality Date   COLONOSCOPY  02/17/2014   per Dr. Olevia Perches, adenomatous polyps, repeat in 7 yrs    FOOT SURGERY Left    NASAL SINUS SURGERY     30+ yeras ago   OVARIAN CYST SURGERY     POLYPECTOMY     TA+   TUBAL LIGATION     VAGINAL HYSTERECTOMY      Social History:  reports that she quit smoking about 31 years ago. Her smoking use included cigarettes. She has a 32.00 pack-year smoking history. She has never used smokeless tobacco. She reports current alcohol use of about 7.0 standard drinks of alcohol per week. She reports that she does not use drugs.  Allergies: No Known Allergies  Family History:  Family History  Problem Relation Age of Onset   Hypertension Mother    Breast cancer Maternal Aunt    Cancer Maternal Aunt        Vulvar Ca in situ   Diabetes Maternal Uncle    Colon cancer Neg Hx    Pancreatic cancer Neg Hx    Stomach cancer Neg Hx    Colon polyps Neg Hx    Esophageal cancer Neg Hx    Rectal cancer Neg Hx      Current Outpatient Medications:    amoxicillin (AMOXIL) 500 MG tablet, Take 1 tablet (500 mg total) by mouth 2 (two) times daily for 10 days.,  Disp: 20 tablet, Rfl: 0   atenolol-chlorthalidone (TENORETIC) 50-25 MG tablet, TAKE 1 TABLET DAILY, Disp: 90 tablet, Rfl: 0   atorvastatin (LIPITOR) 40 MG tablet, Take 1 tablet (40 mg total) by mouth daily., Disp: 90 tablet, Rfl: 3   Calcium-Magnesium-Vitamin D (CALCIUM 1200+D3 PO), Take by mouth., Disp: , Rfl:    Cyanocobalamin (VITAMIN B 12 PO), Take 1,000 mcg by mouth daily., Disp: , Rfl:    estradiol (VIVELLE-DOT) 0.025 MG/24HR, Place 1 patch onto the skin 2 (two) times a week. Recommend weaning and stopping HRT in the course of the year., Disp: 24 patch, Rfl: 4   KLOR-CON M20 20 MEQ tablet, TAKE 1 TABLET DAILY, Disp: 90 tablet, Rfl: 1   losartan (COZAAR) 50 MG tablet, TAKE 1 TABLET DAILY, Disp: 90 tablet, Rfl: 0   meloxicam (MOBIC) 15 MG tablet, TAKE 1 TABLET DAILY, Disp: 90 tablet, Rfl: 0   OVER THE COUNTER MEDICATION, [facility] brand 24-hour nondrowsy allergy med daily equivalent to Claritin, Disp: , Rfl:    pyridOXINE (VITAMIN B-6) 100 MG tablet, Take 100 mg by mouth daily., Disp: , Rfl:    zolpidem (AMBIEN)  10 MG tablet, Take 1 tablet (10 mg total) by mouth at bedtime as needed for sleep., Disp: 90 tablet, Rfl: 1  Review of Systems:  Constitutional: Positive for chills, diaphoresis, appetite change and fatigue.  HEENT: Denies photophobia, eye pain, redness, neck pain, neck stiffness and tinnitus.   Respiratory: Denies SOB, DOE, chest tightness,  and wheezing.   Cardiovascular: Denies chest pain, palpitations and leg swelling.  Gastrointestinal: Denies nausea, vomiting, abdominal pain, diarrhea, constipation, blood in stool and abdominal distention.  Genitourinary: Denies dysuria, urgency, frequency, hematuria, flank pain and difficulty urinating.  Endocrine: Denies: hot or cold intolerance, sweats, changes in hair or nails, polyuria, polydipsia. Musculoskeletal: Denies myalgias, back pain, joint swelling, arthralgias and gait problem.  Skin: Denies pallor, rash and wound.   Neurological: Denies dizziness, seizures, syncope, weakness, light-headedness, numbness and headaches.  Hematological: Denies adenopathy. Easy bruising, personal or family bleeding history  Psychiatric/Behavioral: Denies suicidal ideation, mood changes, confusion, nervousness, sleep disturbance and agitation    Physical Exam: Vitals:   07/18/22 1603 07/18/22 1606  BP: (!) 180/100 (!) 172/98  Pulse: 70   Temp: 98.9 F (37.2 C)   TempSrc: Oral   SpO2: 98%   Weight: 139 lb 9.6 oz (63.3 kg)     Body mass index is 23.23 kg/m.   Constitutional: NAD, calm, comfortable Eyes: PERRL, lids and conjunctivae normal ENMT: Mucous membranes are moist.  Tympanic membrane is pearly white, no erythema or bulging. Respiratory: clear to auscultation bilaterally, no wheezing, no crackles. Normal respiratory effort. No accessory muscle use.  Cardiovascular: Regular rate and rhythm, no murmurs / rubs / gallops. No extremity edema.  Psychiatric: Normal judgment and insight. Alert and oriented x 3. Normal mood.    Impression and Plan:  Strep pharyngitis - Plan: amoxicillin (AMOXIL) 500 MG tablet  Sore throat - Plan: POC COVID-19, POC Rapid Strep A  -In office COVID test is negative, however her rapid strep test was positive. -Start amoxicillin 500 mg twice daily for 10 days.   Time spent:31 minutes reviewing chart, interviewing and examining patient and formulating plan of care.       Lelon Frohlich, MD [facility]

## 2022-08-07 ENCOUNTER — Other Ambulatory Visit: Payer: Self-pay | Admitting: Family Medicine

## 2022-08-10 ENCOUNTER — Encounter: Payer: Self-pay | Admitting: Family Medicine

## 2022-08-10 ENCOUNTER — Ambulatory Visit (INDEPENDENT_AMBULATORY_CARE_PROVIDER_SITE_OTHER): Payer: Medicare Other | Admitting: Family Medicine

## 2022-08-10 VITALS — BP 160/90 | HR 100 | Temp 98.1°F | Resp 16 | Ht 65.0 in | Wt 146.4 lb

## 2022-08-10 DIAGNOSIS — I10 Essential (primary) hypertension: Secondary | ICD-10-CM | POA: Diagnosis not present

## 2022-08-10 DIAGNOSIS — J302 Other seasonal allergic rhinitis: Secondary | ICD-10-CM | POA: Diagnosis not present

## 2022-08-10 DIAGNOSIS — R070 Pain in throat: Secondary | ICD-10-CM | POA: Diagnosis not present

## 2022-08-10 DIAGNOSIS — R059 Cough, unspecified: Secondary | ICD-10-CM

## 2022-08-10 MED ORDER — PREDNISONE 20 MG PO TABS: 40.0000 mg | ORAL_TABLET | Freq: Every day | ORAL | 0 refills | Status: AC

## 2022-08-10 MED ORDER — LOSARTAN POTASSIUM 50 MG PO TABS: 100.0000 mg | ORAL_TABLET | Freq: Every day | ORAL | 0 refills | Status: DC

## 2022-08-10 NOTE — Assessment & Plan Note (Signed)
This is most likely what is causing most of her respiratory symptoms. After discussion of some side effects, she agrees with trying prednisone 40 mg daily in the morning for 3 to 5 days. Flonase nasal spray at night for 10 to 14 days. Nasal saline irrigations as needed. Hold on Benadryl for a few days. Follow-up with PCP in 2 weeks.

## 2022-08-10 NOTE — Patient Instructions (Addendum)
A few things to remember from today's visit:  Seasonal allergic rhinitis, unspecified trigger - Plan: predniSONE (DELTASONE) 20 MG tablet  Throat discomfort  Cough, unspecified type  Throat lozenges for burning throat sensation. Hold on Benadryl for a few days. Try Plain Mucinex. Saline nasal irrigations several days per day and as needed. Flonase nasal spray daily at bedtime for 10-14 days.  Increase Losartan from 50 mg to 100 mg (2 tabs). Continue monitoring BP at home and follow with PCP in 2 weeks. If you need refills for medications you take chronically, please call your pharmacy. Do not use My Chart to request refills or for acute issues that need immediate attention. If you send a my chart message, it may take a few days to be addressed, specially if I am not in the office.  Please be sure medication list is accurate. If a new problem present, please set up appointment sooner than planned today.

## 2022-08-10 NOTE — Assessment & Plan Note (Signed)
We discussed possible etiologies, most likely residual after respiratory a few weeks ago and aggravated by allergies. Lung auscultation today negative, we do not have x-ray service today but we have the option to do it at [facility] or to bring it back tomorrow, she prefers to hold on imaging for now. Plain Mucinex over-the-counter may help. Adequate hydration. Instructed about warning signs.

## 2022-08-10 NOTE — Progress Notes (Signed)
ACUTE VISIT Chief Complaint  Patient presents with   Cough   HPI: [name] is a 71 y.o. female, who is here today complaining of  persistent cough for a few weeks, accompanied by throat issues. She reports experiencing a burning sensation in her throat yesterday and stuffiness.  Negative for stridor or dysphagia.  She denies having a fever or being exposed to any sick individuals recently.   Cough This is a new problem. The current episode started 1 to 4 weeks ago. The problem has been unchanged. The problem occurs every few hours. The cough is Non-productive. Associated symptoms include ear congestion, nasal congestion, postnasal drip and rhinorrhea. Pertinent negatives include no chest pain, chills, ear pain, headaches, heartburn, hemoptysis, myalgias, rash or wheezing. Nothing aggravates the symptoms. She has tried nothing for the symptoms. Her past medical history is significant for environmental allergies.   She has been taking Benadryl for allergic rhinitis. She denies any wheezing or dyspnea, she reports an inability to breathe through her nose.  She recently recovered from strep throat, completed 10 days of amoxicillin.  Hypertension on atenolol-chlorthalidone 50-25 mg daily and losartan 50 mg daily.  Reporting elevated BP readings at home for the past 2 weeks, yesterday her BP was 210/100. She attributes elevated BP to work related stress. She denies CP, palpitations, orthopnea, PND, or edema.  Review of Systems  Constitutional:  Positive for fatigue. Negative for chills.  HENT:  Positive for postnasal drip and rhinorrhea. Negative for ear pain, facial swelling and trouble swallowing.   Respiratory:  Positive for cough. Negative for hemoptysis and wheezing.   Cardiovascular:  Negative for chest pain.  Gastrointestinal:  Negative for abdominal pain, heartburn, nausea and vomiting.  Musculoskeletal:  Negative for myalgias.  Skin:  Negative for rash.   Allergic/Immunologic: Positive for environmental allergies.  Neurological:  Negative for syncope, weakness and headaches.  See other pertinent positives and negatives in HPI.  Current Outpatient Medications on File Prior to Visit  Medication Sig Dispense Refill   atenolol-chlorthalidone (TENORETIC) 50-25 MG tablet TAKE 1 TABLET DAILY 90 tablet 0   atorvastatin (LIPITOR) 40 MG tablet Take 1 tablet (40 mg total) by mouth daily. 90 tablet 3   Calcium-Magnesium-Vitamin D (CALCIUM 1200+D3 PO) Take by mouth.     Cyanocobalamin (VITAMIN B 12 PO) Take 1,000 mcg by mouth daily.     estradiol (VIVELLE-DOT) 0.025 MG/24HR Place 1 patch onto the skin 2 (two) times a week. Recommend weaning and stopping HRT in the course of the year. 24 patch 4   meloxicam (MOBIC) 15 MG tablet TAKE 1 TABLET DAILY 90 tablet 0   OVER THE COUNTER MEDICATION [facility] brand 24-hour nondrowsy allergy med daily equivalent to Claritin     potassium chloride SA (KLOR-CON M20) 20 MEQ tablet TAKE 1 TABLET DAILY 90 tablet 0   pyridOXINE (VITAMIN B-6) 100 MG tablet Take 100 mg by mouth daily.     zolpidem (AMBIEN) 10 MG tablet Take 1 tablet (10 mg total) by mouth at bedtime as needed for sleep. 90 tablet 1   No current facility-administered medications on file prior to visit.    Past Medical History:  Diagnosis Date   Allergy    CIN III (cervical intraepithelial neoplasia grade III) with severe dysplasia    Dysmenorrhea    GERD (gastroesophageal reflux disease)    on occasion only   Granuloma annulare    Hyperlipidemia    Hypertension    Ovarian cyst    No  Known Allergies  Social History   Socioeconomic History   Marital status: Married    Spouse name: Not on file   Number of children: Not on file   Years of education: Not on file   Highest education level: Some college, no degree  Occupational History   Not on file  Tobacco Use   Smoking status: Former    Packs/day: 1.00    Years: 32.00    Total pack years:  32.00    Types: Cigarettes    Quit date: 06/11/1991    Years since quitting: 31.1   Smokeless tobacco: Never  Vaping Use   Vaping Use: Never used  Substance and Sexual Activity   Alcohol use: Yes    Alcohol/week: 7.0 standard drinks of alcohol    Types: 7 Glasses of wine per week    Comment: daily ( 2)    Drug use: No   Sexual activity: Yes    Partners: Male    Birth control/protection: Surgical    Comment: 1st intercourse- 16, partners-  28, married- 91 yrs   Other Topics Concern   Not on file  Social History Narrative   Not on file   Social Determinants of Health   Financial Resource Strain: Low Risk  (02/16/2022)   Overall Financial Resource Strain (CARDIA)    Difficulty of Paying Living Expenses: Not hard at all  Food Insecurity: No Food Insecurity (02/16/2022)   Hunger Vital Sign    Worried About Running Out of Food in the Last Year: Never true    [place] in the Last Year: Never true  Transportation Needs: Unknown (02/16/2022)   PRAPARE - Transportation    Lack of Transportation (Medical): No    Lack of Transportation (Non-Medical): Patient refused  Physical Activity: Unknown (02/16/2022)   Exercise Vital Sign    Days of Exercise per Week: Patient refused    Minutes of Exercise per Session: Patient refused  Stress: Unknown (02/16/2022)   [place] of Stress : Patient refused  Social Connections: Unknown (02/16/2022)   Social Connection and Isolation Panel [NHANES]    Frequency of Communication with Friends and Family: Patient refused    Frequency of Social Gatherings with Friends and Family: Patient refused    Attends Religious Services: Never    Marine scientist or Organizations: Patient refused    Attends Archivist Meetings: Patient refused    Marital Status: Married   Vitals:   08/10/22 1221  BP: (!) 136/90  Pulse: 100  Resp: 16  Temp: 98.1 F (36.7 C)  SpO2: 99%    Body mass index is 24.36 kg/m.  Physical Exam Vitals and nursing note reviewed.  Constitutional:      General: She is not in acute distress.    Appearance: She is well-developed. She is not ill-appearing.  HENT:     Head: Normocephalic and atraumatic.     Right Ear: Tympanic membrane, ear canal and external ear normal.     Left Ear: Tympanic membrane, ear canal and external ear normal.     Nose: Septal deviation, congestion and rhinorrhea present.     Right Turbinates: Enlarged.     Left Turbinates: Enlarged.     Right Sinus: No maxillary sinus tenderness or frontal sinus tenderness.     Left Sinus: No maxillary sinus tenderness or frontal sinus tenderness.     Mouth/Throat:     Mouth: Mucous  membranes are moist.     Pharynx: Oropharynx is clear. Uvula midline. No oropharyngeal exudate or posterior oropharyngeal erythema.     Comments: Postnasal drainage. Eyes:     Conjunctiva/sclera: Conjunctivae normal.  Cardiovascular:     Rate and Rhythm: Normal rate and regular rhythm.     Heart sounds: No murmur heard. Pulmonary:     Effort: Pulmonary effort is normal. No respiratory distress.     Breath sounds: Normal breath sounds. No stridor.  Lymphadenopathy:     Cervical: No cervical adenopathy.  Skin:    General: Skin is warm.     Findings: No erythema or rash.  Neurological:     General: No focal deficit present.     Mental Status: She is alert and oriented to person, place, and time.  Psychiatric:        Mood and Affect: Affect normal. Mood is anxious.   ASSESSMENT AND PLAN:  [name] is a 71 yo female seen for cough.  Cough, unspecified type Assessment & Plan: We discussed possible etiologies, most likely residual after respiratory a few weeks ago and aggravated by allergies. Lung auscultation today negative, we do not have x-ray service today but we have the option to do it at [facility] or to bring it back tomorrow, she prefers to hold on imaging for now. Plain  Mucinex over-the-counter may help. Adequate hydration. Instructed about warning signs.   Seasonal allergic rhinitis, unspecified trigger Assessment & Plan: This is most likely what is causing most of her respiratory symptoms. After discussion of some side effects, she agrees with trying prednisone 40 mg daily in the morning for 3 to 5 days. Flonase nasal spray at night for 10 to 14 days. Nasal saline irrigations as needed. Hold on Benadryl for a few days. Follow-up with PCP in 2 weeks.  Orders: -     predniSONE; Take 2 tablets (40 mg total) by mouth daily with breakfast for 5 days.  Dispense: 10 tablet; Refill: 0  Throat discomfort Assessment & Plan: Burning sensation since yesterday could be aggravated by postnasal drainage. For now recommend throat lozenges. Monitor for new symptoms.   Essential hypertension Assessment & Plan: Reporting elevated BP readings at home for the past 2 weeks. Re-checked 160/90 LUE. Because BP has been persistent elevated for the past 2 weeks, recommend increasing dose of losartan from 50 mg to 100 mg. No changes in atenolol-chlorthalidone dose. Continue low-salt diet. Continue monitoring BP at home regularly. Instructed about warning signs. Follow-up with PCP in 2 weeks, before if needed.  Orders: -     Losartan Potassium; Take 2 tablets (100 mg total) by mouth daily.  Dispense: 90 tablet; Refill: 0   Return in about 2 weeks (around 08/24/2022) for Nasal congestion and HTN with PCP.  Demarkis Gheen G. Martin[place], MD  [facility]. [place] office.

## 2022-08-10 NOTE — Assessment & Plan Note (Signed)
Burning sensation since yesterday could be aggravated by postnasal drainage. For now recommend throat lozenges. Monitor for new symptoms.

## 2022-08-10 NOTE — Assessment & Plan Note (Signed)
Reporting elevated BP readings at home for the past 2 weeks. Re-checked 160/90 LUE. Because BP has been persistent elevated for the past 2 weeks, recommend increasing dose of losartan from 50 mg to 100 mg. No changes in atenolol-chlorthalidone dose. Continue low-salt diet. Continue monitoring BP at home regularly. Instructed about warning signs. Follow-up with PCP in 2 weeks, before if needed.

## 2022-08-16 DIAGNOSIS — Z1231 Encounter for screening mammogram for malignant neoplasm of breast: Secondary | ICD-10-CM | POA: Diagnosis not present

## 2022-08-20 ENCOUNTER — Encounter: Payer: Self-pay | Admitting: Obstetrics & Gynecology

## 2022-11-13 ENCOUNTER — Telehealth: Payer: Self-pay | Admitting: Family Medicine

## 2022-11-13 NOTE — Telephone Encounter (Addendum)
Pt would like #30 day supply of losartan (COZAAR) 50 MG tablet ,meloxicam (MOBIC) 15 MG tablet   [facility] QI:[id number] [place], [address] Phone: [phone]  Fax: [phone]    also #90 each of above medication and Also potassium chloride SA (KLOR-CON M20) 20 MEQ tablet  send to [place], [place].  Phone: [phone]  Fax: [phone]

## 2022-11-15 ENCOUNTER — Other Ambulatory Visit: Payer: Self-pay

## 2022-11-15 DIAGNOSIS — I10 Essential (primary) hypertension: Secondary | ICD-10-CM

## 2022-11-15 MED ORDER — MELOXICAM 15 MG PO TABS: 15.0000 mg | ORAL_TABLET | Freq: Every day | ORAL | 0 refills | Status: DC

## 2022-11-15 MED ORDER — POTASSIUM CHLORIDE CRYS ER 20 MEQ PO TBCR: 20.0000 meq | EXTENDED_RELEASE_TABLET | Freq: Every day | ORAL | 0 refills | Status: DC

## 2022-11-15 MED ORDER — LOSARTAN POTASSIUM 50 MG PO TABS: 100.0000 mg | ORAL_TABLET | Freq: Every day | ORAL | 0 refills | Status: DC

## 2022-11-15 NOTE — Telephone Encounter (Signed)
Rx sent 

## 2022-11-22 ENCOUNTER — Other Ambulatory Visit: Payer: Self-pay

## 2022-11-22 DIAGNOSIS — I10 Essential (primary) hypertension: Secondary | ICD-10-CM

## 2022-11-22 MED ORDER — ATENOLOL-CHLORTHALIDONE 50-25 MG PO TABS: 1.0000 | ORAL_TABLET | Freq: Every day | ORAL | 1 refills | Status: DC

## 2022-11-22 MED ORDER — ATORVASTATIN CALCIUM 40 MG PO TABS: 40.0000 mg | ORAL_TABLET | Freq: Every day | ORAL | 3 refills | Status: DC

## 2022-11-22 MED ORDER — MELOXICAM 15 MG PO TABS: 15.0000 mg | ORAL_TABLET | Freq: Every day | ORAL | 0 refills | Status: DC

## 2022-11-22 MED ORDER — LOSARTAN POTASSIUM 50 MG PO TABS: 100.0000 mg | ORAL_TABLET | Freq: Every day | ORAL | 1 refills | Status: DC

## 2022-11-26 ENCOUNTER — Encounter: Payer: Self-pay | Admitting: Family Medicine

## 2022-11-26 MED ORDER — ZOLPIDEM TARTRATE 10 MG PO TABS: 10.0000 mg | ORAL_TABLET | Freq: Every evening | ORAL | 1 refills | Status: DC | PRN

## 2022-11-26 NOTE — Telephone Encounter (Signed)
Done

## 2022-12-17 ENCOUNTER — Other Ambulatory Visit: Payer: Self-pay | Admitting: Family Medicine

## 2022-12-17 DIAGNOSIS — I10 Essential (primary) hypertension: Secondary | ICD-10-CM

## 2023-01-02 ENCOUNTER — Other Ambulatory Visit: Payer: Self-pay | Admitting: Family Medicine

## 2023-01-14 ENCOUNTER — Other Ambulatory Visit: Payer: Self-pay | Admitting: Family Medicine

## 2023-01-14 DIAGNOSIS — I10 Essential (primary) hypertension: Secondary | ICD-10-CM

## 2023-01-18 ENCOUNTER — Other Ambulatory Visit: Payer: Self-pay | Admitting: Family Medicine

## 2023-02-13 ENCOUNTER — Other Ambulatory Visit: Payer: Self-pay | Admitting: Family Medicine

## 2023-02-13 DIAGNOSIS — I10 Essential (primary) hypertension: Secondary | ICD-10-CM

## 2023-02-18 ENCOUNTER — Other Ambulatory Visit: Payer: Self-pay | Admitting: Family Medicine

## 2023-02-22 ENCOUNTER — Ambulatory Visit (INDEPENDENT_AMBULATORY_CARE_PROVIDER_SITE_OTHER): Payer: Medicare Other

## 2023-02-22 VITALS — BP 120/64 | HR 60 | Temp 98.4°F | Ht 65.0 in | Wt 142.6 lb

## 2023-02-22 DIAGNOSIS — Z0000 Encounter for general adult medical examination without abnormal findings: Secondary | ICD-10-CM

## 2023-02-22 NOTE — Progress Notes (Signed)
Subjective:   Melody Sutton is a 72 y.o. female who presents for Medicare Annual (Subsequent) preventive examination.  Review of Systems     Cardiac Risk Factors include: advanced age (>13men, >59 women);hypertension     Objective:    Today's Vitals   02/22/23 1334  BP: 120/64  Pulse: 60  Temp: 98.4 F (36.9 C)  TempSrc: Oral  SpO2: 97%  Weight: 142 lb 9.6 oz (64.7 kg)  Height: 5\' 5"  (1.651 m)   Body mass index is 23.73 kg/m.     02/22/2023    1:55 PM 02/16/2022   11:36 AM 11/18/2020    2:20 PM 02/07/2018    4:36 PM 02/03/2014    4:37 PM  Advanced Directives  Does Patient Have a Medical Advance Directive? Yes Yes Yes Yes Patient has advance directive, copy not in chart  Type of Advance Directive Healthcare Power of [place];Living will Healthcare Power of [place];Living will Healthcare Power of [place];Living will    Does patient want to make changes to medical advance directive?  No - Patient declined No - Patient declined  No change requested  Copy of Healthcare Power of Attorney in Chart? No - copy requested No - copy requested No - copy requested    Pre-existing out of facility DNR order (yellow form or pink MOST form)     No    Current Medications (verified) Outpatient Encounter Medications as of 02/22/2023  Medication Sig   atenolol-chlorthalidone (TENORETIC) 50-25 MG tablet Take 1 tablet by mouth daily.   atorvastatin (LIPITOR) 40 MG tablet Take 1 tablet (40 mg total) by mouth daily.   Calcium-Magnesium-Vitamin D (CALCIUM 1200+D3 PO) Take by mouth.   losartan (COZAAR) 50 MG tablet TAKE 2 TABLETS BY MOUTH DAILY; **MUST CALL MD FOR APPOINTMENT FOR FUTURE REFILLS   meloxicam (MOBIC) 15 MG tablet TAKE 1 TABLET BY MOUTH DAILY   potassium chloride SA (KLOR-CON M) 20 MEQ tablet TAKE 1 TABLET BY MOUTH DAILY   zolpidem (AMBIEN) 10 MG tablet Take 1 tablet (10 mg total) by mouth at bedtime as needed for sleep.   [DISCONTINUED] Cyanocobalamin (VITAMIN B 12 PO) Take 1,000  mcg by mouth daily.   [DISCONTINUED] estradiol (VIVELLE-DOT) 0.025 MG/24HR Place 1 patch onto the skin 2 (two) times a week. Recommend weaning and stopping HRT in the course of the year.   [DISCONTINUED] OVER THE COUNTER MEDICATION [facility] brand 24-hour nondrowsy allergy med daily equivalent to Claritin   [DISCONTINUED] pyridOXINE (VITAMIN B-6) 100 MG tablet Take 100 mg by mouth daily.   No facility-administered encounter medications on file as of 02/22/2023.    Allergies (verified) Patient has no known allergies.   History: Past Medical History:  Diagnosis Date   Allergy    CIN III (cervical intraepithelial neoplasia grade III) with severe dysplasia    Dysmenorrhea    GERD (gastroesophageal reflux disease)    on occasion only   Granuloma annulare    Hyperlipidemia    Hypertension    Ovarian cyst    Past Surgical History:  Procedure Laterality Date   COLONOSCOPY  02/17/2014   per Dr. Juanda Chance, adenomatous polyps, repeat in 7 yrs    FOOT SURGERY Left    NASAL SINUS SURGERY     30+ yeras ago   OVARIAN CYST SURGERY     POLYPECTOMY     TA+   TUBAL LIGATION     VAGINAL HYSTERECTOMY     Family History  Problem Relation Age of Onset   Hypertension  Mother    Breast cancer Maternal Aunt    Cancer Maternal Aunt        Vulvar Ca in situ   Diabetes Maternal Uncle    Colon cancer Neg Hx    Pancreatic cancer Neg Hx    Stomach cancer Neg Hx    Colon polyps Neg Hx    Esophageal cancer Neg Hx    Rectal cancer Neg Hx    Social History   Socioeconomic History   Marital status: Married    Spouse name: Not on file   Number of children: Not on file   Years of education: Not on file   Highest education level: Some college, no degree  Occupational History   Not on file  Tobacco Use   Smoking status: Former    Packs/day: 1.00    Years: 32.00    Additional pack years: 0.00    Total pack years: 32.00    Types: Cigarettes    Quit date: 06/11/1991    Years since quitting: 31.7    Smokeless tobacco: Never  Vaping Use   Vaping Use: Never used  Substance and Sexual Activity   Alcohol use: Yes    Alcohol/week: 7.0 standard drinks of alcohol    Types: 7 Glasses of wine per week    Comment: daily ( 2)    Drug use: No   Sexual activity: Yes    Partners: Male    Birth control/protection: Surgical    Comment: 1st intercourse- 16, partners-  5, married- 35 yrs   Other Topics Concern   Not on file  Social History Narrative   Not on file   Social Determinants of Health   Financial Resource Strain: Low Risk  (02/22/2023)   Overall Financial Resource Strain (CARDIA)    Difficulty of Paying Living Expenses: Not hard at all  Food Insecurity: No Food Insecurity (02/22/2023)   Hunger Vital Sign    Worried About Running Out of Food in the Last Year: Never true    Ran Out of Food in the Last Year: Never true  Transportation Needs: No Transportation Needs (02/22/2023)   PRAPARE - Administrator, Civil Service (Medical): No    Lack of Transportation (Non-Medical): No  Physical Activity: Inactive (02/22/2023)   Exercise Vital Sign    Days of Exercise per Week: 0 days    Minutes of Exercise per Session: 0 min  Stress: No Stress Concern Present (02/22/2023)   [employer] of Occupational Health - Occupational Stress Questionnaire    Feeling of Stress : Not at all  Social Connections: Socially Integrated (02/22/2023)   Social Connection and Isolation Panel [NHANES]    Frequency of Communication with Friends and Family: More than three times a week    Frequency of Social Gatherings with Friends and Family: More than three times a week    Attends Religious Services: More than 4 times per year    Active Member of [employer] or Organizations: Yes    Attends Engineer, structural: More than 4 times per year    Marital Status: Married    Tobacco Counseling Counseling given: Not Answered   Clinical Intake:  Pre-visit preparation completed: No  Pain :  No/denies pain     BMI - recorded: 23.73 Nutritional Status: BMI of 19-24  Normal Nutritional Risks: None Diabetes: No  How often do you need to have someone help you when you read instructions, pamphlets, or other written materials from your doctor or pharmacy?:  1 - Never  Diabetic?  No  Interpreter Needed?: No      Activities of Daily Living    02/22/2023    1:52 PM 02/19/2023    9:54 AM  In your present state of health, do you have any difficulty performing the following activities:  Hearing? 1 1  Comment Pening hearing screening   Vision? 1 1  Comment Pending Vision appt   Difficulty concentrating or making decisions? 0 0  Walking or climbing stairs? 0 0  Dressing or bathing? 0 0  Doing errands, shopping? 0 0  Preparing Food and eating ? N N  Using the Toilet? N N  In the past six months, have you accidently leaked urine? N N  Do you have problems with loss of bowel control? N N  Managing your Medications? N N  Managing your Finances? N N  Housekeeping or managing your Housekeeping? N N    Patient Care Team: Nelwyn Salisbury, MD as PCP - General  Indicate any recent Medical Services you may have received from other than [facility] providers in the past year (date may be approximate).     Assessment:   This is a routine wellness examination for Arifa.  Hearing/Vision screen Hearing Screening - Comments:: Denies hearing difficulties   Vision Screening - Comments:: Wears rx glasses - up to date with routine eye exams with  [facility]  Dietary issues and exercise activities discussed: Exercise limited by: None identified   Goals Addressed               This Visit's Progress     Increase physical activity (pt-stated)         Depression Screen    02/22/2023    1:33 PM 02/16/2022   11:30 AM 10/18/2021    4:08 PM 08/09/2021   11:23 AM 11/18/2020    2:27 PM 02/07/2018    4:38 PM 03/28/2016   10:20 AM  PHQ 2/9 Scores  PHQ - 2 Score 0 0 0 0 2 0 0  PHQ- 9  Score   3 0 9      Fall Risk    02/22/2023    1:54 PM 02/19/2023    9:54 AM 02/16/2022   11:33 AM 02/15/2022   12:28 PM 10/18/2021    4:09 PM  Fall Risk   Falls in the past year? 0 0 1 1 1   Number falls in past yr: 0  0  0  Injury with Fall? 0  0  1  Comment   Bruising to rt side back. Followed by PCP  bruise on Left rib cage  Risk for fall due to : No Fall Risks  No Fall Risks  No Fall Risks  Follow up Falls prevention discussed    Falls evaluation completed;Follow up appointment;Falls prevention discussed    FALL RISK PREVENTION PERTAINING TO THE HOME:  Any stairs in or around the home? Yes  If so, are there any without handrails? No  Home free of loose throw rugs in walkways, pet beds, electrical cords, etc? Yes  Adequate lighting in your home to reduce risk of falls? Yes   ASSISTIVE DEVICES UTILIZED TO PREVENT FALLS:  Life alert? No  Use of a cane, walker or w/c? No  Grab bars in the bathroom? No  Shower chair or bench in shower? No  Elevated toilet seat or a handicapped toilet? No   TIMED UP AND GO:  Was the test performed? Yes .  Length  of time to ambulate 10 feet: 10 sec.   Gait steady and fast without use of assistive device  Cognitive Function:        02/22/2023    1:55 PM 02/16/2022   11:36 AM  6CIT Screen  What Year? 0 points 0 points  What month? 0 points 0 points  What time? 0 points 0 points  Count back from 20 0 points 0 points  Months in reverse 0 points 0 points  Repeat phrase 0 points 0 points  Total Score 0 points 0 points    Immunizations Immunization History  Administered Date(s) Administered   Fluad Quad(high Dose 65+) 05/29/2019, 06/02/2020   Influenza Split 08/03/2011   Influenza Whole 09/05/2009, 06/09/2010   Influenza, High Dose Seasonal PF 07/24/2018   PFIZER(Purple Top)SARS-COV-2 Vaccination 11/05/2019, 12/02/2019, 07/23/2020   Pneumococcal Polysaccharide-23 01/23/2019, 06/02/2020   Td 09/06/2010   Zoster Recombinat (Shingrix)  03/24/2021   Zoster, Live 03/11/2017    TDAP status: Due, Education has been provided regarding the importance of this vaccine. Advised may receive this vaccine at local pharmacy or Health Dept. Aware to provide a copy of the vaccination record if obtained from local pharmacy or Health Dept. Verbalized acceptance and understanding.    Pneumococcal vaccine status: Due, Education has been provided regarding the importance of this vaccine. Advised may receive this vaccine at local pharmacy or Health Dept. Aware to provide a copy of the vaccination record if obtained from local pharmacy or Health Dept. Verbalized acceptance and understanding.  Covid-19 vaccine status: Completed vaccines  Qualifies for Shingles Vaccine? Yes   Zostavax completed Yes   Shingrix Completed?: Yes  Screening Tests Health Maintenance  Topic Date Due   DTaP/Tdap/Td (2 - Tdap) 09/06/2020   COVID-19 Vaccine (4 - 2023-24 season) 03/10/2023 (Originally 05/11/2022)   Zoster Vaccines- Shingrix (2 of 2) 05/25/2023 (Originally 05/19/2021)   Pneumonia Vaccine 51+ Years old (2 of 2 - PCV) 02/22/2024 (Originally 06/02/2021)   INFLUENZA VACCINE  04/11/2023   MAMMOGRAM  08/17/2023   Medicare Annual Wellness (AWV)  02/22/2024   Colonoscopy  05/10/2026   DEXA SCAN  Completed   Hepatitis C Screening  Completed   HPV VACCINES  Aged Out    Health Maintenance  Health Maintenance Due  Topic Date Due   DTaP/Tdap/Td (2 - Tdap) 09/06/2020    Colorectal cancer screening: Type of screening: Colonoscopy. Completed 05/10/21. Repeat every 5 years  Mammogram status: Completed 08/16/22. Repeat every year  Bone Density status: Completed 08/04/19. Results reflect: Bone density results: OSTEOPOROSIS. Repeat every   years.  Lung Cancer Screening: (Low Dose CT Chest recommended if Age 46-80 years, 30 pack-year currently smoking OR have quit w/in 15years.)  qualify.     Additional Screening:  Hepatitis C Screening: does qualify;  Completed 02/07/18  Vision Screening: Recommended annual ophthalmology exams for early detection of glaucoma and other disorders of the eye. Is the patient up to date with their annual eye exam?  Yes  Who is the provider or what is the name of the office in which the patient attends annual eye exams? [facility] If pt is not established with a provider, would they like to be referred to a provider to establish care? No .   Dental Screening: Recommended annual dental exams for proper oral hygiene  Community Resource Referral / Chronic Care Management:  CRR required this visit?  No   CCM required this visit?  No      Plan:  I have personally reviewed and noted the following in the patient's chart:   Medical and social history Use of alcohol, tobacco or illicit drugs  Current medications and supplements including opioid prescriptions. Patient is not currently taking opioid prescriptions. Functional ability and status Nutritional status Physical activity Advanced directives List of other physicians Hospitalizations, surgeries, and ER visits in previous 12 months Vitals Screenings to include cognitive, depression, and falls Referrals and appointments  In addition, I have reviewed and discussed with patient certain preventive protocols, quality metrics, and best practice recommendations. A written personalized care plan for preventive services as well as general preventive health recommendations were provided to patient.     Tillie Rung, LPN   12/01/4008   Nurse Notes: None

## 2023-02-22 NOTE — Patient Instructions (Addendum)
Melody Sutton , Thank you for taking time to come for your Medicare Wellness Visit. I appreciate your ongoing commitment to your health goals. Please review the following plan we discussed and let me know if I can assist you in the future.   These are the goals we discussed:  Goals       Increase physical activity (pt-stated)      Increase physical activity (pt-stated)      Patient Stated      To get address the abd bloating issues      Patient Stated      I would like to get a part time job.         This is a list of the screening recommended for you and due dates:  Health Maintenance  Topic Date Due   DTaP/Tdap/Td vaccine (2 - Tdap) 09/06/2020   COVID-19 Vaccine (4 - 2023-24 season) 03/10/2023*   Zoster (Shingles) Vaccine (2 of 2) 05/25/2023*   Pneumonia Vaccine (2 of 2 - PCV) 02/22/2024*   Flu Shot  04/11/2023   Mammogram  08/17/2023   Medicare Annual Wellness Visit  02/22/2024   Colon Cancer Screening  05/10/2026   DEXA scan (bone density measurement)  Completed   Hepatitis C Screening  Completed   HPV Vaccine  Aged Out  *Topic was postponed. The date shown is not the original due date.    Advanced directives: Please bring a copy of your health care power of attorney and living will to the office to be added to your chart at your convenience.   Conditions/risks identified: None  Next appointment: Follow up in one year for your annual wellness visit    Preventive Care 65 Years and Older, Female Preventive care refers to lifestyle choices and visits with your health care provider that can promote health and wellness. What does preventive care include? A yearly physical exam. This is also called an annual well check. Dental exams once or twice a year. Routine eye exams. Ask your health care provider how often you should have your eyes checked. Personal lifestyle choices, including: Daily care of your teeth and gums. Regular physical activity. Eating a healthy  diet. Avoiding tobacco and drug use. Limiting alcohol use. Practicing safe sex. Taking low-dose aspirin every day. Taking vitamin and mineral supplements as recommended by your health care provider. What happens during an annual well check? The services and screenings done by your health care provider during your annual well check will depend on your age, overall health, lifestyle risk factors, and family history of disease. Counseling  Your health care provider may ask you questions about your: Alcohol use. Tobacco use. Drug use. Emotional well-being. Home and relationship well-being. Sexual activity. Eating habits. History of falls. Memory and ability to understand (cognition). Work and work Astronomer. Reproductive health. Screening  You may have the following tests or measurements: Height, weight, and BMI. Blood pressure. Lipid and cholesterol levels. These may be checked every 5 years, or more frequently if you are over 64 years old. Skin check. Lung cancer screening. You may have this screening every year starting at age 79 if you have a 30-pack-year history of smoking and currently smoke or have quit within the past 15 years. Fecal occult blood test (FOBT) of the stool. You may have this test every year starting at age 40. Flexible sigmoidoscopy or colonoscopy. You may have a sigmoidoscopy every 5 years or a colonoscopy every 10 years starting at age 11. Hepatitis C blood test.  Hepatitis B blood test. Sexually transmitted disease (STD) testing. Diabetes screening. This is done by checking your blood sugar (glucose) after you have not eaten for a while (fasting). You may have this done every 1-3 years. Bone density scan. This is done to screen for osteoporosis. You may have this done starting at age 49. Mammogram. This may be done every 1-2 years. Talk to your health care provider about how often you should have regular mammograms. Talk with your health care provider about  your test results, treatment options, and if necessary, the need for more tests. Vaccines  Your health care provider may recommend certain vaccines, such as: Influenza vaccine. This is recommended every year. Tetanus, diphtheria, and acellular pertussis (Tdap, Td) vaccine. You may need a Td booster every 10 years. Zoster vaccine. You may need this after age 49. Pneumococcal 13-valent conjugate (PCV13) vaccine. One dose is recommended after age 41. Pneumococcal polysaccharide (PPSV23) vaccine. One dose is recommended after age 13. Talk to your health care provider about which screenings and vaccines you need and how often you need them. This information is not intended to replace advice given to you by your health care provider. Make sure you discuss any questions you have with your health care provider. Document Released: 09/23/2015 Document Revised: 05/16/2016 Document Reviewed: 06/28/2015 Elsevier Interactive Patient Education  2017 [employer].  Fall Prevention in the Home Falls can cause injuries. They can happen to people of all ages. There are many things you can do to make your home safe and to help prevent falls. What can I do on the outside of my home? Regularly fix the edges of walkways and driveways and fix any cracks. Remove anything that might make you trip as you walk through a door, such as a raised step or threshold. Trim any bushes or trees on the path to your home. Use bright outdoor lighting. Clear any walking paths of anything that might make someone trip, such as rocks or tools. Regularly check to see if handrails are loose or broken. Make sure that both sides of any steps have handrails. Any raised decks and porches should have guardrails on the edges. Have any leaves, snow, or ice cleared regularly. Use sand or salt on walking paths during winter. Clean up any spills in your garage right away. This includes oil or grease spills. What can I do in the bathroom? Use  night lights. Install grab bars by the toilet and in the tub and shower. Do not use towel bars as grab bars. Use non-skid mats or decals in the tub or shower. If you need to sit down in the shower, use a plastic, non-slip stool. Keep the floor dry. Clean up any water that spills on the floor as soon as it happens. Remove soap buildup in the tub or shower regularly. Attach bath mats securely with double-sided non-slip rug tape. Do not have throw rugs and other things on the floor that can make you trip. What can I do in the bedroom? Use night lights. Make sure that you have a light by your bed that is easy to reach. Do not use any sheets or blankets that are too big for your bed. They should not hang down onto the floor. Have a firm chair that has side arms. You can use this for support while you get dressed. Do not have throw rugs and other things on the floor that can make you trip. What can I do in the kitchen? Clean up  any spills right away. Avoid walking on wet floors. Keep items that you use a lot in easy-to-reach places. If you need to reach something above you, use a strong step stool that has a grab bar. Keep electrical cords out of the way. Do not use floor polish or wax that makes floors slippery. If you must use wax, use non-skid floor wax. Do not have throw rugs and other things on the floor that can make you trip. What can I do with my stairs? Do not leave any items on the stairs. Make sure that there are handrails on both sides of the stairs and use them. Fix handrails that are broken or loose. Make sure that handrails are as long as the stairways. Check any carpeting to make sure that it is firmly attached to the stairs. Fix any carpet that is loose or worn. Avoid having throw rugs at the top or bottom of the stairs. If you do have throw rugs, attach them to the floor with carpet tape. Make sure that you have a light switch at the top of the stairs and the bottom of the  stairs. If you do not have them, ask someone to add them for you. What else can I do to help prevent falls? Wear shoes that: Do not have high heels. Have rubber bottoms. Are comfortable and fit you well. Are closed at the toe. Do not wear sandals. If you use a stepladder: Make sure that it is fully opened. Do not climb a closed stepladder. Make sure that both sides of the stepladder are locked into place. Ask someone to hold it for you, if possible. Clearly mark and make sure that you can see: Any grab bars or handrails. First and last steps. Where the edge of each step is. Use tools that help you move around (mobility aids) if they are needed. These include: Canes. Walkers. Scooters. Crutches. Turn on the lights when you go into a dark area. Replace any light bulbs as soon as they burn out. Set up your furniture so you have a clear path. Avoid moving your furniture around. If any of your floors are uneven, fix them. If there are any pets around you, be aware of where they are. Review your medicines with your doctor. Some medicines can make you feel dizzy. This can increase your chance of falling. Ask your doctor what other things that you can do to help prevent falls. This information is not intended to replace advice given to you by your health care provider. Make sure you discuss any questions you have with your health care provider. Document Released: 06/23/2009 Document Revised: 02/02/2016 Document Reviewed: 10/01/2014 Elsevier Interactive Patient Education  2017 [employer].

## 2023-02-25 ENCOUNTER — Ambulatory Visit (INDEPENDENT_AMBULATORY_CARE_PROVIDER_SITE_OTHER): Payer: Medicare Other | Admitting: Family Medicine

## 2023-02-25 ENCOUNTER — Encounter: Payer: Self-pay | Admitting: Family Medicine

## 2023-02-25 VITALS — BP 128/80 | HR 61 | Temp 98.6°F | Ht 65.0 in | Wt 141.4 lb

## 2023-02-25 DIAGNOSIS — R739 Hyperglycemia, unspecified: Secondary | ICD-10-CM | POA: Diagnosis not present

## 2023-02-25 DIAGNOSIS — N951 Menopausal and female climacteric states: Secondary | ICD-10-CM

## 2023-02-25 DIAGNOSIS — E782 Mixed hyperlipidemia: Secondary | ICD-10-CM | POA: Diagnosis not present

## 2023-02-25 DIAGNOSIS — I10 Essential (primary) hypertension: Secondary | ICD-10-CM

## 2023-02-25 DIAGNOSIS — Z86010 Personal history of colonic polyps: Secondary | ICD-10-CM

## 2023-02-25 DIAGNOSIS — F5101 Primary insomnia: Secondary | ICD-10-CM

## 2023-02-25 LAB — CBC WITH DIFFERENTIAL/PLATELET
Basophils Absolute: 0.1 10*3/uL (ref 0.0–0.1)
Basophils Relative: 0.6 % (ref 0.0–3.0)
Eosinophils Absolute: 0.4 10*3/uL (ref 0.0–0.7)
Eosinophils Relative: 3.2 % (ref 0.0–5.0)
HCT: 43.4 % (ref 36.0–46.0)
Hemoglobin: 14.1 g/dL (ref 12.0–15.0)
Lymphocytes Relative: 13.3 % (ref 12.0–46.0)
Lymphs Abs: 1.5 10*3/uL (ref 0.7–4.0)
MCHC: 32.4 g/dL (ref 30.0–36.0)
MCV: 89.6 fl (ref 78.0–100.0)
Monocytes Absolute: 0.8 10*3/uL (ref 0.1–1.0)
Monocytes Relative: 7.7 % (ref 3.0–12.0)
Neutro Abs: 8.2 10*3/uL — ABNORMAL HIGH (ref 1.4–7.7)
Neutrophils Relative %: 75.2 % (ref 43.0–77.0)
Platelets: 272 10*3/uL (ref 150.0–400.0)
RBC: 4.85 Mil/uL (ref 3.87–5.11)
RDW: 14.3 % (ref 11.5–15.5)
WBC: 10.9 10*3/uL — ABNORMAL HIGH (ref 4.0–10.5)

## 2023-02-25 LAB — HEPATIC FUNCTION PANEL
ALT: 18 U/L (ref 0–35)
AST: 24 U/L (ref 0–37)
Albumin: 4.6 g/dL (ref 3.5–5.2)
Alkaline Phosphatase: 68 U/L (ref 39–117)
Bilirubin, Direct: 0.2 mg/dL (ref 0.0–0.3)
Total Bilirubin: 0.8 mg/dL (ref 0.2–1.2)
Total Protein: 8 g/dL (ref 6.0–8.3)

## 2023-02-25 LAB — HEMOGLOBIN A1C: Hgb A1c MFr Bld: 5.8 % (ref 4.6–6.5)

## 2023-02-25 LAB — BASIC METABOLIC PANEL
BUN: 14 mg/dL (ref 6–23)
CO2: 30 mEq/L (ref 19–32)
Calcium: 10.2 mg/dL (ref 8.4–10.5)
Chloride: 95 mEq/L — ABNORMAL LOW (ref 96–112)
Creatinine, Ser: 0.67 mg/dL (ref 0.40–1.20)
GFR: 87.1 mL/min (ref >60.00)
Glucose, Bld: 106 mg/dL — ABNORMAL HIGH (ref 70–99)
Potassium: 3.7 mEq/L (ref 3.5–5.1)
Sodium: 134 mEq/L — ABNORMAL LOW (ref 135–145)

## 2023-02-25 LAB — TSH: TSH: 1.22 u[IU]/mL (ref 0.35–5.50)

## 2023-02-25 LAB — LIPID PANEL
Cholesterol: 194 mg/dL (ref 0–200)
HDL: 84.5 mg/dL (ref >39.00)
LDL Cholesterol: 93 mg/dL (ref 0–99)
NonHDL: 109.58
Total CHOL/HDL Ratio: 2
Triglycerides: 85 mg/dL (ref 0.0–149.0)
VLDL: 17 mg/dL (ref 0.0–40.0)

## 2023-02-25 MED ORDER — LOSARTAN POTASSIUM 50 MG PO TABS
100.0000 mg | ORAL_TABLET | Freq: Every day | ORAL | 3 refills | Status: DC
Start: 2023-02-25 — End: 2023-12-12

## 2023-02-25 NOTE — Progress Notes (Signed)
Subjective:    Patient ID: Melody Sutton, female    DOB: 1950-09-15, 72 y.o.   MRN: [id number]  HPI Here to follow up on issues. She feels well in general. Her BP has been stable. She sleeps well with the Zolpidem. She tries to eat a healthy diet. She gets yearly mammograms. She was tapered off estrogen by her GYN, and now she has some hot flashes.    Review of Systems  Constitutional: Negative.   HENT: Negative.    Eyes: Negative.   Respiratory: Negative.    Cardiovascular: Negative.   Gastrointestinal: Negative.   Genitourinary:  Negative for decreased urine volume, difficulty urinating, dyspareunia, dysuria, enuresis, flank pain, frequency, hematuria, pelvic pain and urgency.  Musculoskeletal: Negative.   Skin: Negative.   Neurological: Negative.  Negative for headaches.  Psychiatric/Behavioral: Negative.         Objective:   Physical Exam Constitutional:      General: She is not in acute distress.    Appearance: Normal appearance. She is well-developed.  HENT:     Head: Normocephalic and atraumatic.     Right Ear: External ear normal.     Left Ear: External ear normal.     Nose: Nose normal.     Mouth/Throat:     Pharynx: No oropharyngeal exudate.  Eyes:     General: No scleral icterus.    Conjunctiva/sclera: Conjunctivae normal.     Pupils: Pupils are equal, round, and reactive to light.  Neck:     Thyroid: No thyromegaly.     Vascular: No JVD.  Cardiovascular:     Rate and Rhythm: Normal rate and regular rhythm.     Pulses: Normal pulses.     Heart sounds: Normal heart sounds. No murmur heard.    No friction rub. No gallop.  Pulmonary:     Effort: Pulmonary effort is normal. No respiratory distress.     Breath sounds: Normal breath sounds. No wheezing or rales.  Chest:     Chest wall: No tenderness.  Abdominal:     General: Bowel sounds are normal. There is no distension.     Palpations: Abdomen is soft. There is no mass.     Tenderness: There is no  abdominal tenderness. There is no guarding or rebound.  Musculoskeletal:        General: No tenderness. Normal range of motion.     Cervical back: Normal range of motion and neck supple.  Lymphadenopathy:     Cervical: No cervical adenopathy.  Skin:    General: Skin is warm and dry.     Findings: No erythema or rash.  Neurological:     Mental Status: She is alert and oriented to person, place, and time.     Cranial Nerves: No cranial nerve deficit.     Motor: No abnormal muscle tone.     Coordination: Coordination normal.     Deep Tendon Reflexes: Reflexes are normal and symmetric. Reflexes normal.  Psychiatric:        Mood and Affect: Mood normal.        Behavior: Behavior normal.        Thought Content: Thought content normal.        Judgment: Judgment normal.           Assessment & Plan:  Her HTN is well controlled. For the hot flashes, she can try OTC black cohosh. She is past due for another colonoscopy, so we will set this up. Get fasting  labs for lipids, etc. We spent a total of ( 33  ) minutes reviewing records and discussing these issues.  Gershon Crane, MD

## 2023-03-01 ENCOUNTER — Encounter: Payer: Self-pay | Admitting: Gastroenterology

## 2023-03-16 ENCOUNTER — Other Ambulatory Visit: Payer: Self-pay | Admitting: Family Medicine

## 2023-03-16 DIAGNOSIS — I10 Essential (primary) hypertension: Secondary | ICD-10-CM

## 2023-04-01 IMAGING — DX DG RIBS 2V*R*
4 series · 4 of 4 positions shown · non-contrast
Comparison: 05/30/2016

CLINICAL DATA: Fall several days ago with right rib pain, initial
encounter

EXAM:
RIGHT RIBS - 2 VIEW

[chest pa]
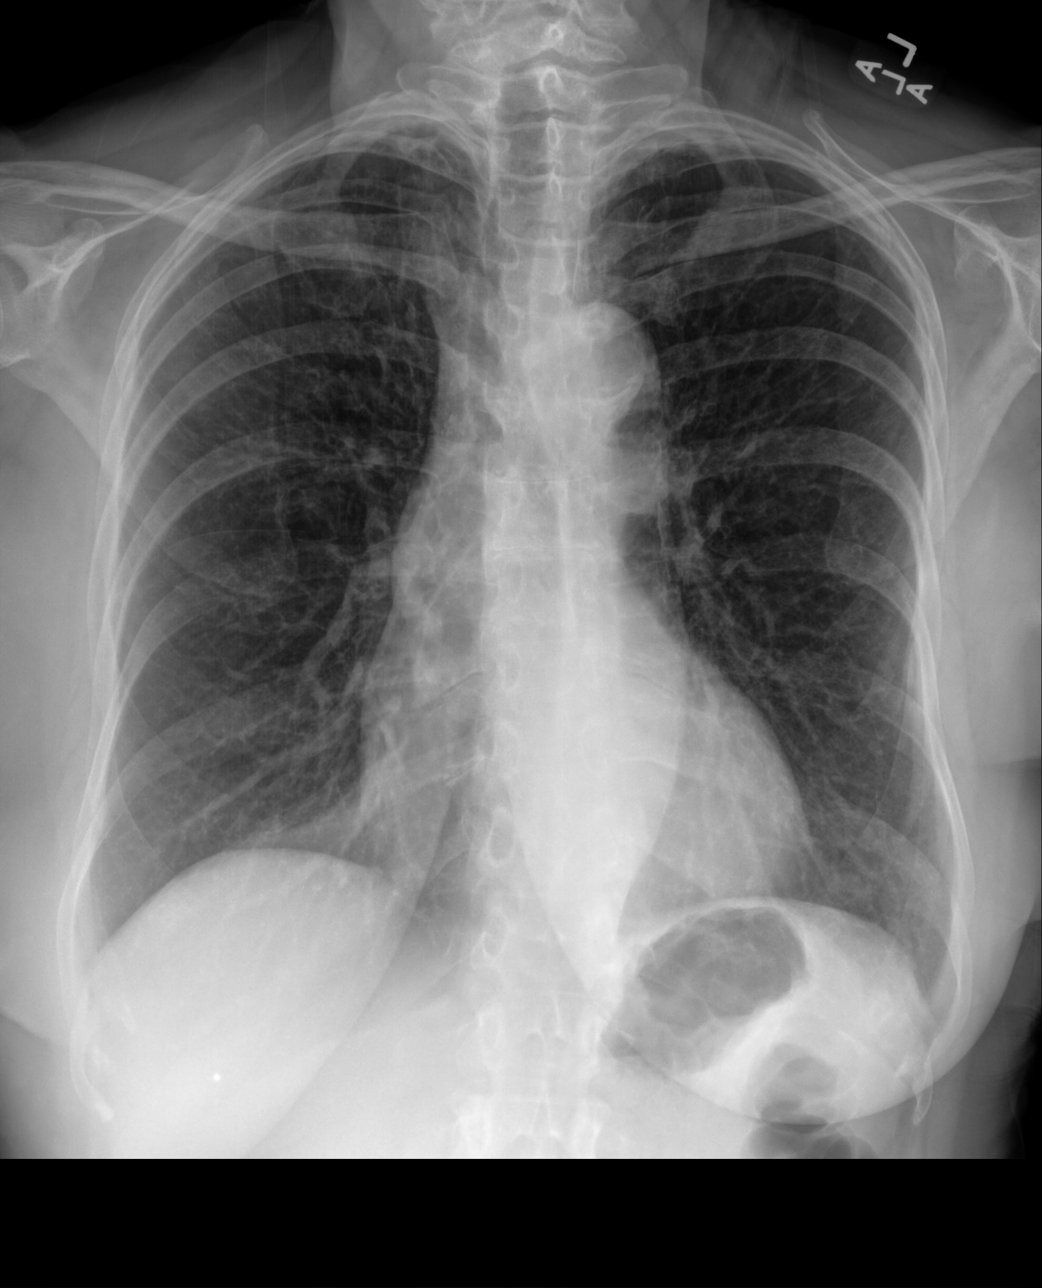

[ribs ap upper]
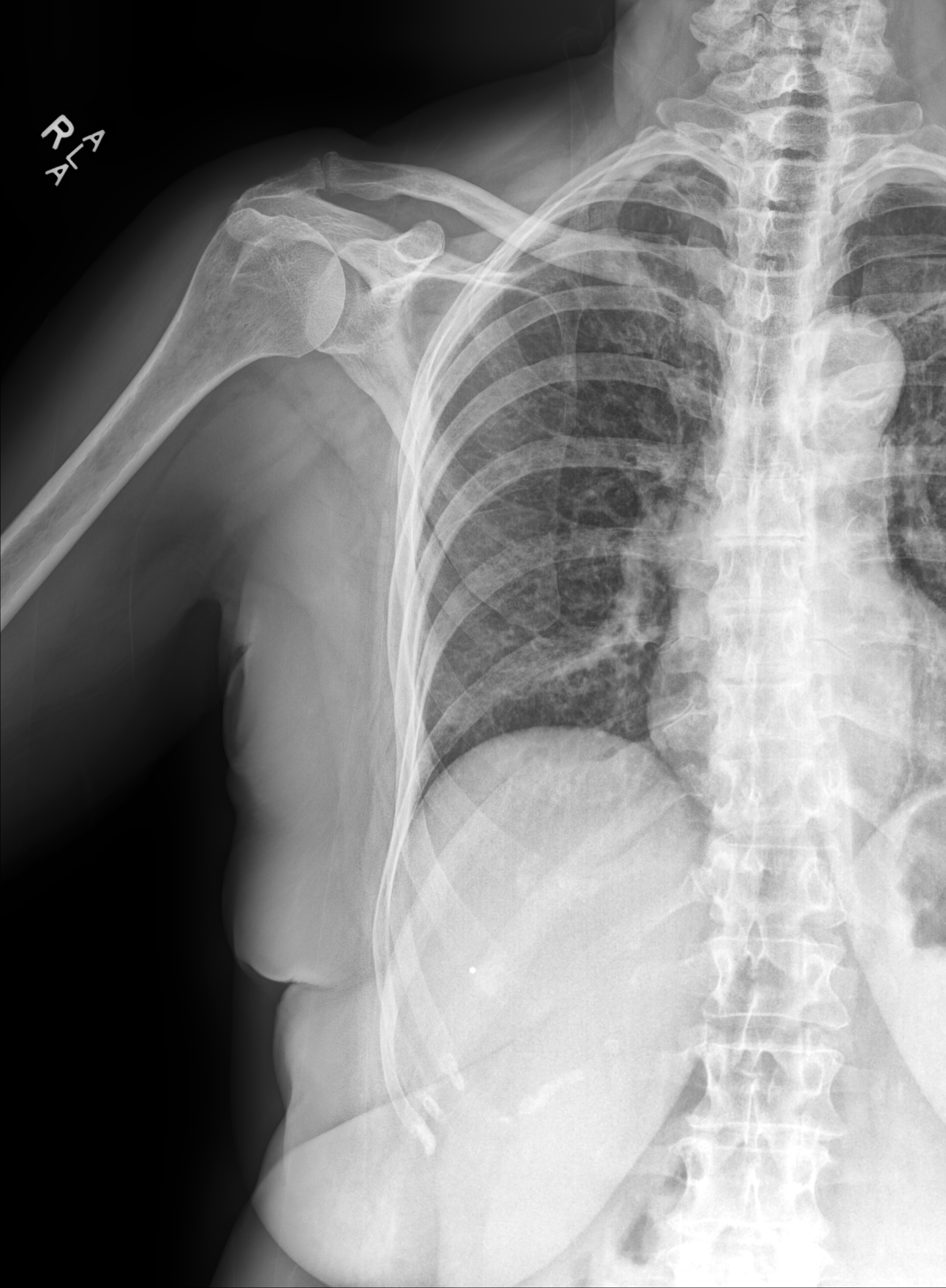

[ribs ap obl]
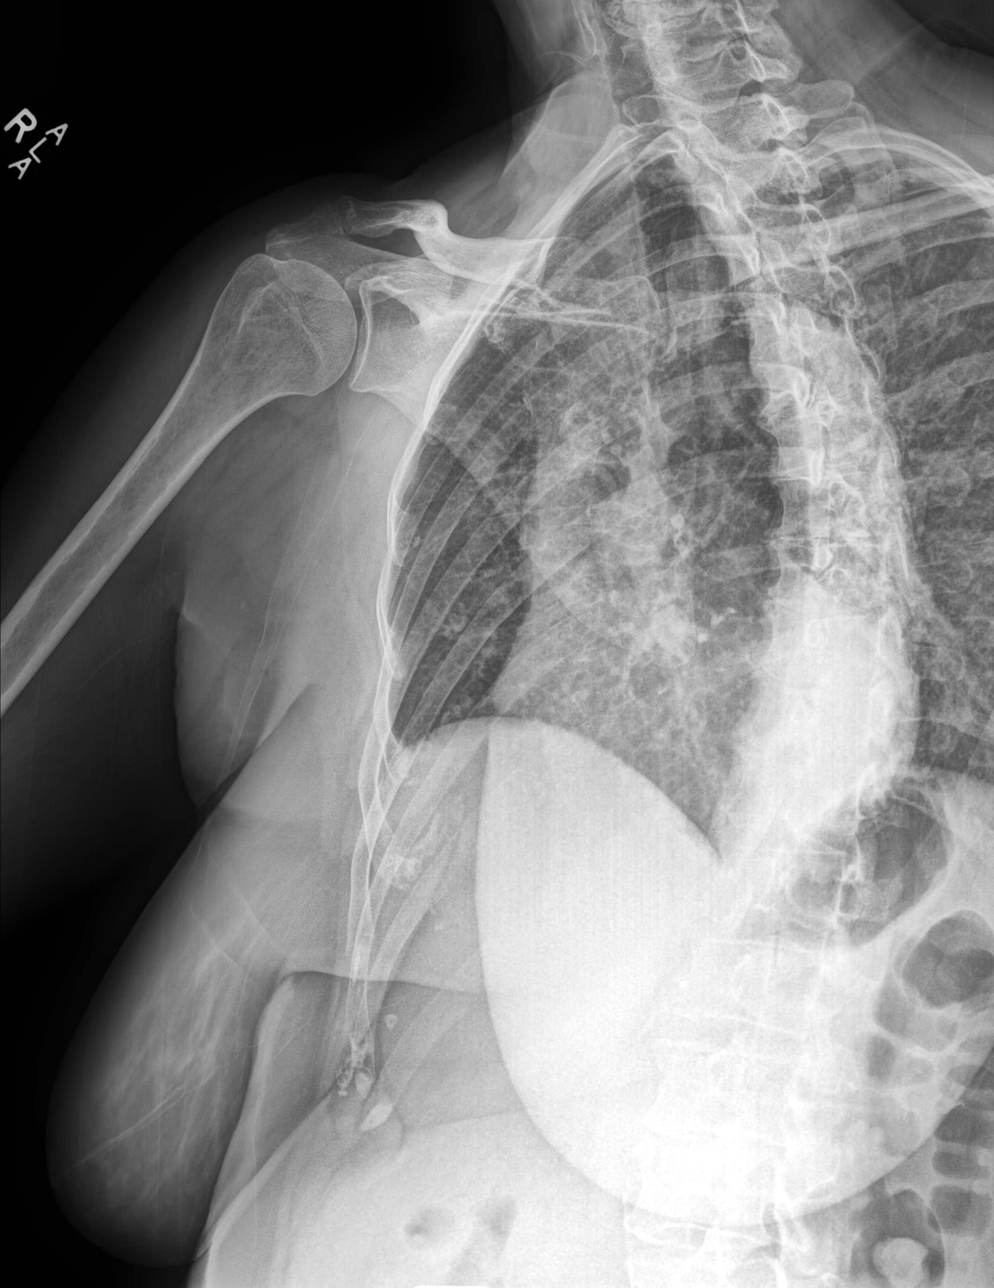

[ribs ap lower]
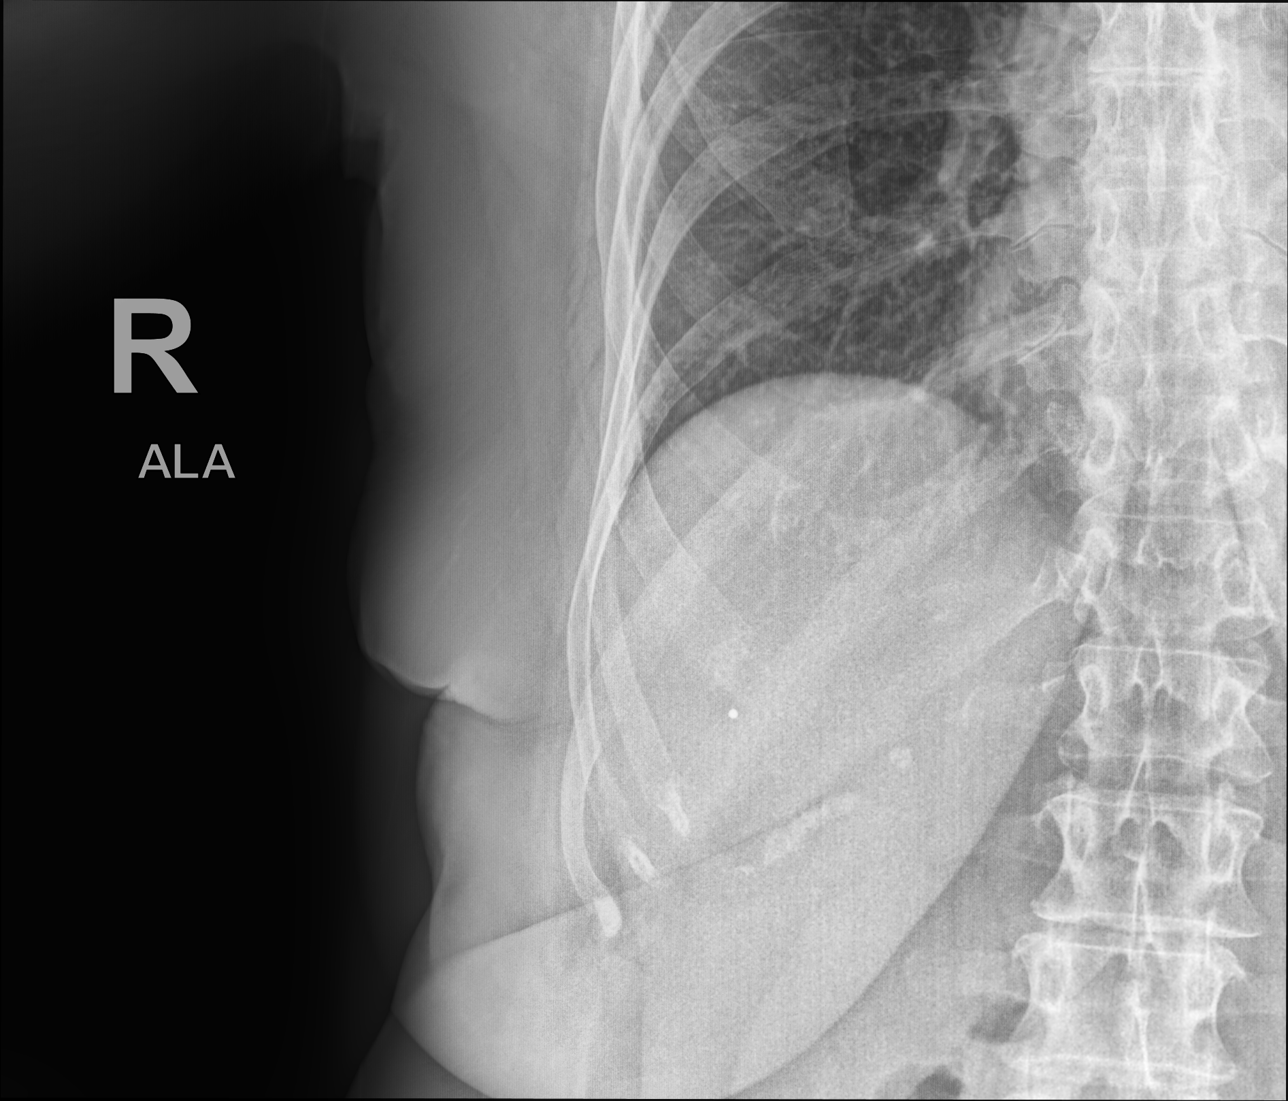

[4 of 4 positions shown; findings below may reference images not displayed]

FINDINGS: Cardiac shadow is within normal limits. Aortic calcifications are
seen. Lungs are well aerated bilaterally. No focal infiltrate or
sizable effusion is seen. No acute rib abnormality is noted.
IMPRESSION: No definitive rib abnormality is noted.

## 2023-04-02 ENCOUNTER — Other Ambulatory Visit: Payer: Self-pay | Admitting: Family Medicine

## 2023-04-02 DIAGNOSIS — I10 Essential (primary) hypertension: Secondary | ICD-10-CM

## 2023-04-03 ENCOUNTER — Other Ambulatory Visit: Payer: Self-pay | Admitting: Family Medicine

## 2023-04-03 DIAGNOSIS — I10 Essential (primary) hypertension: Secondary | ICD-10-CM

## 2023-04-14 ENCOUNTER — Other Ambulatory Visit: Payer: Self-pay | Admitting: Family Medicine

## 2023-04-14 DIAGNOSIS — I10 Essential (primary) hypertension: Secondary | ICD-10-CM

## 2023-04-18 ENCOUNTER — Ambulatory Visit (AMBULATORY_SURGERY_CENTER): Payer: Medicare Other

## 2023-04-18 VITALS — Ht 65.0 in | Wt 142.0 lb

## 2023-04-18 DIAGNOSIS — Z86010 Personal history of colonic polyps: Secondary | ICD-10-CM

## 2023-04-18 MED ORDER — NA SULFATE-K SULFATE-MG SULF 17.5-3.13-1.6 GM/177ML PO SOLN
1.0000 | Freq: Once | ORAL | 0 refills | Status: AC
Start: 2023-04-18 — End: 2023-04-18

## 2023-04-18 NOTE — Progress Notes (Signed)
No egg or soy allergy known to patient  No issues known to pt with past sedation with any surgeries or procedures Patient denies ever being told they had issues or difficulty with intubation  No FH of Malignant Hyperthermia Pt is not on diet pills Pt is not on  home 02  Pt is not on blood thinners  Pt denies issues with constipation uses Metamucil No A fib or A flutter Have any cardiac testing pending--no Pt can ambulate independently Pt denies use of chewing tobacco Discussed diabetic I weight loss medication holds Discussed NSAID holds Checked BMI Pt instructed to use Single[employer]com or GoodRx for a price reduction on prep  Patient's chart reviewed by Cathlyn Parsons CNRA prior to previsit and patient appropriate for the [facility].  Pre visit completed and red dot placed by patient's name on their procedure day (on provider's schedule).

## 2023-05-03 ENCOUNTER — Other Ambulatory Visit: Payer: Self-pay | Admitting: Family Medicine

## 2023-05-03 NOTE — Telephone Encounter (Signed)
Pt LOV was on 02/25/23 Last refill done on 11/26/22 Please advise

## 2023-05-09 ENCOUNTER — Encounter: Payer: Medicare Other | Admitting: Gastroenterology

## 2023-05-14 ENCOUNTER — Encounter: Payer: Self-pay | Admitting: Gastroenterology

## 2023-05-18 ENCOUNTER — Other Ambulatory Visit: Payer: Self-pay | Admitting: Family Medicine

## 2023-05-29 ENCOUNTER — Encounter: Payer: Self-pay | Admitting: Gastroenterology

## 2023-05-29 ENCOUNTER — Ambulatory Visit (AMBULATORY_SURGERY_CENTER): Payer: Medicare Other | Admitting: Gastroenterology

## 2023-05-29 VITALS — BP 196/93 | HR 70 | Temp 98.4°F | Resp 15 | Ht 65.0 in | Wt 142.0 lb

## 2023-05-29 DIAGNOSIS — Z09 Encounter for follow-up examination after completed treatment for conditions other than malignant neoplasm: Secondary | ICD-10-CM

## 2023-05-29 DIAGNOSIS — Z86010 Personal history of colonic polyps: Secondary | ICD-10-CM

## 2023-05-29 DIAGNOSIS — D123 Benign neoplasm of transverse colon: Secondary | ICD-10-CM | POA: Diagnosis not present

## 2023-05-29 HISTORY — PX: COLONOSCOPY: SHX174

## 2023-05-29 MED ORDER — SODIUM CHLORIDE 0.9 % IV SOLN: 500.0000 mL | INTRAVENOUS | Status: DC

## 2023-05-29 NOTE — Progress Notes (Signed)
Dr is aware of the pts high bp.  Pt to call PCP to be evaluated.

## 2023-05-29 NOTE — Patient Instructions (Signed)
Resume all of your routine medications today as ordered.  Read the handouts given to you by your recovery room nurse.   Try to see your PCP about your blood pressure.  It was quite high during your procedure.  YOU HAD AN ENDOSCOPIC PROCEDURE TODAY AT THE [facility]:   Refer to the procedure report that was given to you for any specific questions about what was found during the examination.  If the procedure report does not answer your questions, please call your gastroenterologist to clarify.  If you requested that your care partner not be given the details of your procedure findings, then the procedure report has been included in a sealed envelope for you to review at your convenience later.  YOU SHOULD EXPECT: Some feelings of bloating in the abdomen. Passage of more gas than usual.  Walking can help get rid of the air that was put into your GI tract during the procedure and reduce the bloating. If you had a lower endoscopy (such as a colonoscopy or flexible sigmoidoscopy) you may notice spotting of blood in your stool or on the toilet paper. If you underwent a bowel prep for your procedure, you may not have a normal bowel movement for a few days.  Please Note:  You might notice some irritation and congestion in your nose or some drainage.  This is from the oxygen used during your procedure.  There is no need for concern and it should clear up in a day or so.  SYMPTOMS TO REPORT IMMEDIATELY:  Following lower endoscopy (colonoscopy or flexible sigmoidoscopy):  Excessive amounts of blood in the stool  Significant tenderness or worsening of abdominal pains  Swelling of the abdomen that is new, acute  Fever of 100F or higher   For urgent or emergent issues, a gastroenterologist can be reached at any hour by calling [phone]. Do not use MyChart messaging for urgent concerns.    DIET:  We do recommend a small meal at first, but then you may proceed to your regular diet.  Drink  plenty of fluids but you should avoid alcoholic beverages for 24 hours. Try to increase the fiber in your diet, and drink plenty of water.  ACTIVITY:  You should plan to take it easy for the rest of today and you should NOT DRIVE or use heavy machinery until tomorrow (because of the sedation medicines used during the test).    FOLLOW UP: Our staff will call the number listed on your records the next business day following your procedure.  We will call around 7:15- 8:00 am to check on you and address any questions or concerns that you may have regarding the information given to you following your procedure. If we do not reach you, we will leave a message.     If any biopsies were taken you will be contacted by phone or by letter within the next 1-3 weeks.  Please call us at [phone] if you have not heard about the biopsies in 3 weeks.    SIGNATURES/CONFIDENTIALITY: You and/or your care partner have signed paperwork which will be entered into your electronic medical record.  These signatures attest to the fact that that the information above on your After Visit Summary has been reviewed and is understood.  Full responsibility of the confidentiality of this discharge information lies with you and/or your care-partner.

## 2023-05-29 NOTE — Progress Notes (Signed)
Called to room to assist during endoscopic procedure.  Patient ID and intended procedure confirmed with present staff. Received instructions for my participation in the procedure from the performing physician.  

## 2023-05-29 NOTE — Progress Notes (Signed)
[facility] History and Physical   Primary Care Physician:  Nelwyn Salisbury, MD   Reason for Procedure:  History of adenomatous colon polyps  Plan:    Surveillance colonoscopy with possible interventions as needed     HPI: Melody Sutton is a very pleasant 72 y.o. female here for surveillance colonoscopy. Denies any nausea, vomiting, abdominal pain, melena or bright red blood per rectum  The risks and benefits as well as alternatives of endoscopic procedure(s) have been discussed and reviewed. All questions answered. The patient agrees to proceed.    Past Medical History:  Diagnosis Date   Allergy    Cataract 2023   Dr. Vonna Kotyk   CIN III (cervical intraepithelial neoplasia grade III) with severe dysplasia    Dysmenorrhea    Granuloma annulare    Hyperlipidemia    Hypertension    Ovarian cyst     Past Surgical History:  Procedure Laterality Date   CATARACT EXTRACTION Bilateral 2023   COLONOSCOPY  02/17/2014   per Dr. Juanda Chance, adenomatous polyps, repeat in 7 yrs    FOOT SURGERY Left    NASAL SINUS SURGERY     30+ yeras ago   OVARIAN CYST SURGERY     POLYPECTOMY     TA+   TUBAL LIGATION     VAGINAL HYSTERECTOMY      Prior to Admission medications   Medication Sig Start Date End Date Taking? Authorizing Provider  atenolol-chlorthalidone (TENORETIC) 50-25 MG tablet TAKE 1 TABLET BY MOUTH DAILY 04/04/23  Yes Nelwyn Salisbury, MD  atorvastatin (LIPITOR) 40 MG tablet Take 1 tablet (40 mg total) by mouth daily. 11/22/22  Yes Nelwyn Salisbury, MD  Calcium-Magnesium-Vitamin D (CALCIUM 1200+D3 PO) Take by mouth.   Yes [provider]  losartan (COZAAR) 50 MG tablet Take 2 tablets (100 mg total) by mouth daily. 02/25/23  Yes Nelwyn Salisbury, MD  potassium chloride SA (KLOR-CON M) 20 MEQ tablet TAKE 1 TABLET BY MOUTH DAILY 01/03/23  Yes Nelwyn Salisbury, MD  zolpidem (AMBIEN) 10 MG tablet TAKE 1 TABLET BY MOUTH AT  BEDTIME AS NEEDED FOR SLEEP 05/07/23  Yes Nelwyn Salisbury, MD  meloxicam (MOBIC) 15 MG tablet TAKE 1 TABLET BY MOUTH DAILY Patient not taking: Reported on 05/29/2023 05/20/23   Nelwyn Salisbury, MD    Current Outpatient Medications  Medication Sig Dispense Refill   atenolol-chlorthalidone (TENORETIC) 50-25 MG tablet TAKE 1 TABLET BY MOUTH DAILY 90 tablet 3   atorvastatin (LIPITOR) 40 MG tablet Take 1 tablet (40 mg total) by mouth daily. 90 tablet 3   Calcium-Magnesium-Vitamin D (CALCIUM 1200+D3 PO) Take by mouth.     losartan (COZAAR) 50 MG tablet Take 2 tablets (100 mg total) by mouth daily. 180 tablet 3   potassium chloride SA (KLOR-CON M) 20 MEQ tablet TAKE 1 TABLET BY MOUTH DAILY 90 tablet 3   zolpidem (AMBIEN) 10 MG tablet TAKE 1 TABLET BY MOUTH AT  BEDTIME AS NEEDED FOR SLEEP 90 tablet 1   meloxicam (MOBIC) 15 MG tablet TAKE 1 TABLET BY MOUTH DAILY (Patient not taking: Reported on 05/29/2023) 90 tablet 3   Current Facility-Administered Medications  Medication Dose Route Frequency Provider Last Rate Last Admin   0.9 %  sodium chloride infusion  500 mL Intravenous Continuous Nathon Stefanski, Eleonore Chiquito, MD        Allergies as of 05/29/2023   (No Known Allergies)    Family History  Problem Relation Age of Onset   Hypertension  Mother    Breast cancer Maternal Aunt    Cancer Maternal Aunt        Vulvar Ca in situ   Diabetes Maternal Uncle    Colon cancer Neg Hx    Pancreatic cancer Neg Hx    Stomach cancer Neg Hx    Colon polyps Neg Hx    Esophageal cancer Neg Hx    Rectal cancer Neg Hx     Social History   Socioeconomic History   Marital status: Married    Spouse name: Not on file   Number of children: Not on file   Years of education: Not on file   Highest education level: Some college, no degree  Occupational History   Not on file  Tobacco Use   Smoking status: Former    Current packs/day: 0.00    Average packs/day: 1 pack/day for 32.0 years (32.0 ttl pk-yrs)    Types: Cigarettes    Start date: 06/11/1959    Quit date:  06/11/1991    Years since quitting: 31.9   Smokeless tobacco: Never  Vaping Use   Vaping status: Never Used  Substance and Sexual Activity   Alcohol use: Yes    Alcohol/week: 7.0 standard drinks of alcohol    Types: 7 Glasses of wine per week    Comment: daily ( 2)    Drug use: No   Sexual activity: Yes    Partners: Male    Birth control/protection: Surgical    Comment: 1st intercourse- 16, partners-  5, married- 35 yrs   Other Topics Concern   Not on file  Social History Narrative   Not on file   Social Determinants of Health   Financial Resource Strain: Low Risk  (02/22/2023)   Overall Financial Resource Strain (CARDIA)    Difficulty of Paying Living Expenses: Not hard at all  Food Insecurity: No Food Insecurity (02/22/2023)   Hunger Vital Sign    Worried About Running Out of Food in the Last Year: Never true    Ran Out of Food in the Last Year: Never true  Transportation Needs: No Transportation Needs (02/22/2023)   PRAPARE - Administrator, Civil Service (Medical): No    Lack of Transportation (Non-Medical): No  Physical Activity: Inactive (02/22/2023)   Exercise Vital Sign    Days of Exercise per Week: 0 days    Minutes of Exercise per Session: 0 min  Stress: No Stress Concern Present (02/22/2023)   [employer] of Occupational Health - Occupational Stress Questionnaire    Feeling of Stress : Not at all  Social Connections: Socially Integrated (02/22/2023)   Social Connection and Isolation Panel [NHANES]    Frequency of Communication with Friends and Family: More than three times a week    Frequency of Social Gatherings with Friends and Family: More than three times a week    Attends Religious Services: More than 4 times per year    Active Member of [employer] or Organizations: Yes    Attends Engineer, structural: More than 4 times per year    Marital Status: Married  Catering manager Violence: Not At Risk (02/22/2023)   Humiliation, Afraid, Rape, and  Kick questionnaire    Fear of Current or Ex-Partner: No    Emotionally Abused: No    Physically Abused: No    Sexually Abused: No    Review of Systems:  All other review of systems negative except as mentioned in the HPI.  Physical Exam: Vital  signs in last 24 hours: BP (!) 147/76   Pulse 62   Temp 98.4 F (36.9 C)   Ht 5\' 5"  (1.651 m)   Wt 142 lb (64.4 kg)   LMP 09/10/1978   SpO2 99%   BMI 23.63 kg/m  General:   Alert, NAD Lungs:  Clear .   Heart:  Regular rate and rhythm Abdomen:  Soft, nontender and nondistended. Neuro/Psych:  Alert and cooperative. Normal mood and affect. A and O x 3  Reviewed labs, radiology imaging, old records and pertinent past GI work up  Patient is appropriate for planned procedure(s) and anesthesia in an ambulatory setting   K. Scherry Ran , MD [phone]

## 2023-05-29 NOTE — Progress Notes (Signed)
Vss nad trans to pacu 

## 2023-05-29 NOTE — Op Note (Signed)
[facility] Patient Name: Melody Sutton Procedure Date: 05/29/2023 11:29 AM MRN: [id number] Endoscopist: Napoleon Form , MD, [id number] Age: 72 Referring MD:  Date of Birth: 29-Dec-1950 Gender: Female Account #: [id number] Procedure:                Colonoscopy Indications:              High risk colon cancer surveillance: Personal                            history of colonic polyps, High risk colon cancer                            surveillance: Personal history of multiple (3 or                            more) adenomas, High risk colon cancer                            surveillance: Personal history of adenoma less than                            10 mm in size Medicines:                Monitored Anesthesia Care Procedure:                Pre-Anesthesia Assessment:                           - Prior to the procedure, a History and Physical                            was performed, and patient medications and                            allergies were reviewed. The patient's tolerance of                            previous anesthesia was also reviewed. The risks                            and benefits of the procedure and the sedation                            options and risks were discussed with the patient.                            All questions were answered, and informed consent                            was obtained. Prior Anticoagulants: The patient has                            taken no anticoagulant or antiplatelet agents. ASA  Grade Assessment: II - A patient with mild systemic                            disease. After reviewing the risks and benefits,                            the patient was deemed in satisfactory condition to                            undergo the procedure.                           After obtaining informed consent, the colonoscope                            was passed under direct vision. Throughout the                             procedure, the patient's blood pressure, pulse, and                            oxygen saturations were monitored continuously. The                            Olympus Scope [id number] was introduced through the                            anus and advanced to the the cecum, identified by                            appendiceal orifice and ileocecal valve. The                            colonoscopy was somewhat difficult due to multiple                            diverticula in the colon and restricted mobility of                            the colon. Successful completion of the procedure                            was aided by applying abdominal pressure. The                            patient tolerated the procedure well. The quality                            of the bowel preparation was good. The ileocecal                            valve, appendiceal orifice, and rectum were  photographed. Scope In: 11:34:08 AM Scope Out: 12:05:24 PM Scope Withdrawal Time: 0 hours 15 minutes 56 seconds  Total Procedure Duration: 0 hours 31 minutes 16 seconds  Findings:                 The perianal and digital rectal examinations were                            normal.                           Five sessile polyps were found in the transverse                            colon. The polyps were 4 to 7 mm in size. These                            polyps were removed with a cold snare. Resection                            and retrieval were complete.                           Multiple large-mouthed, medium-mouthed and                            small-mouthed diverticula were found in the sigmoid                            colon. There was narrowing of the colon in                            association with the diverticular opening. There                            was evidence of diverticular spasm.                            Peri-diverticular erythema was  seen. There was                            evidence of an impacted diverticulum.                           Non-bleeding external and internal hemorrhoids were                            found during retroflexion. The hemorrhoids were                            medium-sized. Complications:            No immediate complications. Estimated Blood Loss:     Estimated blood loss: none. Impression:               - Five 4 to 7 mm polyps in the transverse colon,  removed with a cold snare. Resected and retrieved.                           - Severe diverticulosis in the sigmoid colon. There                            was narrowing of the colon in association with the                            diverticular opening. There was evidence of                            diverticular spasm. Peri-diverticular erythema was                            seen. There was evidence of an impacted                            diverticulum.                           - Non-bleeding external and internal hemorrhoids. Recommendation:           - Resume previous diet.                           - Continue present medications.                           - Await pathology results.                           - No repeat colonoscopy due to age. Napoleon Form, MD 05/29/2023 12:12:08 PM This report has been signed electronically.

## 2023-05-30 ENCOUNTER — Telehealth: Payer: Self-pay

## 2023-05-30 NOTE — Telephone Encounter (Signed)
Follow up call to pt, lm for pt to call if having any difficulty with normal activities or eating and drinking.  Also to call if any other questions or concerns.  

## 2023-05-31 ENCOUNTER — Ambulatory Visit (INDEPENDENT_AMBULATORY_CARE_PROVIDER_SITE_OTHER): Payer: Medicare Other | Admitting: Family Medicine

## 2023-05-31 ENCOUNTER — Encounter: Payer: Self-pay | Admitting: Family Medicine

## 2023-05-31 VITALS — BP 140/82 | HR 87 | Temp 98.2°F | Wt 137.4 lb

## 2023-05-31 DIAGNOSIS — I10 Essential (primary) hypertension: Secondary | ICD-10-CM | POA: Diagnosis not present

## 2023-05-31 LAB — SURGICAL PATHOLOGY

## 2023-05-31 MED ORDER — ATENOLOL-CHLORTHALIDONE 100-25 MG PO TABS: 1.0000 | ORAL_TABLET | Freq: Every day | ORAL | 0 refills | Status: DC

## 2023-05-31 NOTE — Progress Notes (Signed)
Subjective:    Patient ID: Melody Sutton, female    DOB: [birth year], 72 y.o.   MRN: [id number]  HPI Here to follow up on HTN. She feels fine, but her BP at home sometimes gets up around 140/90. She recently had a colonoscopy, and apparently her BP transiently shot up to 200 systolic. They asked her to see [place] about this.   Review of Systems  Constitutional: Negative.   Respiratory: Negative.    Cardiovascular: Negative.   Neurological: Negative.        Objective:   Physical Exam Constitutional:      Appearance: Normal appearance.  Cardiovascular:     Rate and Rhythm: Normal rate and regular rhythm.     Pulses: Normal pulses.     Heart sounds: Normal heart sounds.  Pulmonary:     Effort: Pulmonary effort is normal.     Breath sounds: Normal breath sounds.  Musculoskeletal:     Right lower leg: No edema.     Left lower leg: No edema.  Neurological:     Mental Status: She is alert.           Assessment & Plan:  HTN, we will increase the Tenoretic to 100-25 once daily. She will report back in 3-4 weeks. Gershon Crane, MD

## 2023-06-04 ENCOUNTER — Encounter: Payer: Self-pay | Admitting: Gastroenterology

## 2023-07-01 ENCOUNTER — Other Ambulatory Visit: Payer: Self-pay | Admitting: Family Medicine

## 2023-07-30 ENCOUNTER — Other Ambulatory Visit: Payer: Self-pay | Admitting: Family Medicine

## 2023-07-31 ENCOUNTER — Ambulatory Visit (INDEPENDENT_AMBULATORY_CARE_PROVIDER_SITE_OTHER): Payer: Medicare Other | Admitting: Family Medicine

## 2023-07-31 ENCOUNTER — Encounter: Payer: Self-pay | Admitting: Family Medicine

## 2023-07-31 ENCOUNTER — Ambulatory Visit: Payer: Medicare Other

## 2023-07-31 VITALS — BP 118/70 | HR 61 | Temp 98.5°F | Wt 136.8 lb

## 2023-07-31 DIAGNOSIS — G8929 Other chronic pain: Secondary | ICD-10-CM

## 2023-07-31 DIAGNOSIS — M545 Low back pain, unspecified: Secondary | ICD-10-CM

## 2023-07-31 MED ORDER — METHYLPREDNISOLONE 4 MG PO TBPK: ORAL_TABLET | ORAL | 0 refills | Status: DC

## 2023-07-31 NOTE — Progress Notes (Signed)
   Subjective:    Patient ID: Melody Sutton, female    DOB: 1951/04/25, 72 y.o.   MRN: [id number]  HPI Here for low back pain. This started about one year ago, but it has been worse the past 2 weeks. No hx of trauma. No pain in the legs. She takes Meloxicam daily, and recently she has been adding Tylenol to it.    Review of Systems  Constitutional: Negative.   Respiratory: Negative.    Cardiovascular: Negative.   Musculoskeletal:  Positive for back pain.       Objective:   Physical Exam Constitutional:      General: She is not in acute distress.    Appearance: Normal appearance.  Cardiovascular:     Rate and Rhythm: Normal rate and regular rhythm.     Pulses: Normal pulses.     Heart sounds: Normal heart sounds.  Pulmonary:     Effort: Pulmonary effort is normal.     Breath sounds: Normal breath sounds.  Musculoskeletal:     Comments: Her lower back is not tender, but she is tender over both sciatic notches. The spine has full ROM. Negative SLR.   Neurological:     Mental Status: She is alert.           Assessment & Plan:  Low back pain. She will try a Medrol dose pack. We will also get Xrays of the lumbar spine today.  Gershon Crane, MD

## 2023-08-22 LAB — HM MAMMOGRAPHY

## 2023-08-23 ENCOUNTER — Encounter: Payer: Self-pay | Admitting: Family Medicine

## 2023-08-27 ENCOUNTER — Other Ambulatory Visit: Payer: Self-pay | Admitting: Family Medicine

## 2023-08-28 ENCOUNTER — Other Ambulatory Visit: Payer: Self-pay | Admitting: Family Medicine

## 2023-08-29 NOTE — Telephone Encounter (Signed)
Pt LOV was 07/31/23 Last refill was done on 05/07/23 Please advise

## 2023-09-05 ENCOUNTER — Ambulatory Visit (INDEPENDENT_AMBULATORY_CARE_PROVIDER_SITE_OTHER): Payer: Medicare Other | Admitting: Adult Health

## 2023-09-05 ENCOUNTER — Encounter: Payer: Self-pay | Admitting: Adult Health

## 2023-09-05 VITALS — BP 138/80 | HR 62 | Temp 97.9°F | Ht 65.0 in | Wt 131.0 lb

## 2023-09-05 DIAGNOSIS — J0100 Acute maxillary sinusitis, unspecified: Secondary | ICD-10-CM

## 2023-09-05 MED ORDER — AMOXICILLIN-POT CLAVULANATE 875-125 MG PO TABS: 1.0000 | ORAL_TABLET | Freq: Two times a day (BID) | ORAL | 0 refills | Status: DC

## 2023-09-05 NOTE — Progress Notes (Signed)
Subjective:    Patient ID: Melody Sutton, female    DOB: 08/17/1951, 72 y.o.   MRN: [id number]  HPI 72 year old female who  has a past medical history of Allergy, Cataract (2023), CIN III (cervical intraepithelial neoplasia grade III) with severe dysplasia, Dysmenorrhea, Granuloma annulare, Hyperlipidemia, Hypertension, and Ovarian cyst.  She presents to the office today for an acute issues.  Her symptoms started roughly 2 weeks ago at which time she was having night sweats, productive cough, sneezing, nausea, decreased appetite and fatigue.  Her night sweats, cough, nausea all resolved but now she is left with sinus pain and pressure, nasal congestion, and fatigue.  At home she has been using over-the-counter decongestant with some relief   Review of Systems See HPI   Past Medical History:  Diagnosis Date   Allergy    Cataract 2023   Dr. Vonna Kotyk   CIN III (cervical intraepithelial neoplasia grade III) with severe dysplasia    Dysmenorrhea    Granuloma annulare    Hyperlipidemia    Hypertension    Ovarian cyst     Social History   Socioeconomic History   Marital status: Married    Spouse name: Not on file   Number of children: Not on file   Years of education: Not on file   Highest education level: Some college, no degree  Occupational History   Not on file  Tobacco Use   Smoking status: Former    Current packs/day: 0.00    Average packs/day: 1 pack/day for 32.0 years (32.0 ttl pk-yrs)    Types: Cigarettes    Start date: 06/11/1959    Quit date: 06/11/1991    Years since quitting: 32.2   Smokeless tobacco: Never  Vaping Use   Vaping status: Never Used  Substance and Sexual Activity   Alcohol use: Yes    Alcohol/week: 7.0 standard drinks of alcohol    Types: 7 Glasses of wine per week    Comment: daily ( 2)    Drug use: No   Sexual activity: Yes    Partners: Male    Birth control/protection: Surgical    Comment: 1st intercourse- 16, partners-  5, married- 35  yrs   Other Topics Concern   Not on file  Social History Narrative   Not on file   Social Drivers of Health   Financial Resource Strain: Patient Declined (09/03/2023)   Overall Financial Resource Strain (CARDIA)    Difficulty of Paying Living Expenses: Patient declined  Food Insecurity: Patient Declined (09/03/2023)   Hunger Vital Sign    Worried About Running Out of Food in the Last Year: Patient declined    Ran Out of Food in the Last Year: Patient declined  Transportation Needs: No Transportation Needs (09/03/2023)   PRAPARE - Administrator, Civil Service (Medical): No    Lack of Transportation (Non-Medical): No  Physical Activity: Inactive (09/03/2023)   Exercise Vital Sign    Days of Exercise per Week: 0 days    Minutes of Exercise per Session: 0 min  Stress: No Stress Concern Present (09/03/2023)   [employer] of Occupational Health - Occupational Stress Questionnaire    Feeling of Stress : Only a little  Social Connections: Unknown (09/03/2023)   Social Connection and Isolation Panel [NHANES]    Frequency of Communication with Friends and Family: Patient declined    Frequency of Social Gatherings with Friends and Family: Patient declined    Attends Religious Services: Patient  declined    Active Member of Clubs or Organizations: No    Attends Banker Meetings: More than 4 times per year    Marital Status: Married  Catering manager Violence: Not At Risk (02/22/2023)   Humiliation, Afraid, Rape, and Kick questionnaire    Fear of Current or Ex-Partner: No    Emotionally Abused: No    Physically Abused: No    Sexually Abused: No    Past Surgical History:  Procedure Laterality Date   CATARACT EXTRACTION Bilateral 2023   COLONOSCOPY  02/17/2014   per Dr. Juanda Chance, adenomatous polyps, repeat in 7 yrs    FOOT SURGERY Left    NASAL SINUS SURGERY     30+ yeras ago   OVARIAN CYST SURGERY     POLYPECTOMY     TA+   TUBAL LIGATION     VAGINAL  HYSTERECTOMY      Family History  Problem Relation Age of Onset   Hypertension Mother    Breast cancer Maternal Aunt    Cancer Maternal Aunt        Vulvar Ca in situ   Diabetes Maternal Uncle    Colon cancer Neg Hx    Pancreatic cancer Neg Hx    Stomach cancer Neg Hx    Colon polyps Neg Hx    Esophageal cancer Neg Hx    Rectal cancer Neg Hx     No Known Allergies  Current Outpatient Medications on File Prior to Visit  Medication Sig Dispense Refill   atenolol-chlorthalidone (TENORETIC) 100-25 MG tablet TAKE 1 TABLET BY MOUTH DAILY 30 tablet 0   atorvastatin (LIPITOR) 40 MG tablet TAKE 1 TABLET BY MOUTH DAILY 90 tablet 3   Calcium-Magnesium-Vitamin D (CALCIUM 1200+D3 PO) Take by mouth.     losartan (COZAAR) 50 MG tablet Take 2 tablets (100 mg total) by mouth daily. 180 tablet 3   meloxicam (MOBIC) 15 MG tablet TAKE 1 TABLET BY MOUTH DAILY 90 tablet 3   potassium chloride SA (KLOR-CON M) 20 MEQ tablet TAKE 1 TABLET BY MOUTH DAILY 90 tablet 3   zolpidem (AMBIEN) 10 MG tablet TAKE 1 TABLET BY MOUTH AT  BEDTIME AS NEEDED FOR SLEEP 90 tablet 1   No current facility-administered medications on file prior to visit.    BP 138/80   Pulse 62   Temp 97.9 F (36.6 C) (Oral)   Ht 5\' 5"  (1.651 m)   Wt 131 lb (59.4 kg)   LMP 09/10/1978   SpO2 99%   BMI 21.80 kg/m       Objective:   Physical Exam Vitals and nursing note reviewed.  Constitutional:      Appearance: Normal appearance. She is ill-appearing.  HENT:     Nose: Congestion present. No rhinorrhea.     Right Turbinates: Enlarged and swollen.     Left Turbinates: Enlarged and swollen.     Right Sinus: Maxillary sinus tenderness present. No frontal sinus tenderness.     Left Sinus: Maxillary sinus tenderness present. No frontal sinus tenderness.     Mouth/Throat:     Pharynx: Oropharyngeal exudate present.     Tonsils: No tonsillar exudate.     Comments: + PND  Cardiovascular:     Rate and Rhythm: Normal rate and  regular rhythm.     Pulses: Normal pulses.     Heart sounds: Normal heart sounds.  Pulmonary:     Effort: Pulmonary effort is normal.     Breath sounds: Normal breath sounds.  Skin:    General: Skin is dry.  Neurological:     General: No focal deficit present.     Mental Status: She is alert and oriented to person, place, and time.  Psychiatric:        Mood and Affect: Mood normal.        Behavior: Behavior normal.        Thought Content: Thought content normal.        Judgment: Judgment normal.       Assessment & Plan:  1. Acute non-recurrent maxillary sinusitis (Primary) -Will treat for apparent sinusitis due to symptoms and duration.  She was advised follow-up if not improving over the next 3 to 4 days or sooner if symptoms worsen - amoxicillin-clavulanate (AUGMENTIN) 875-125 MG tablet; Take 1 tablet by mouth 2 (two) times daily.  Dispense: 20 tablet; Refill: 0  Shirline Frees, NP

## 2023-09-16 ENCOUNTER — Encounter: Payer: Self-pay | Admitting: Adult Health

## 2023-10-02 ENCOUNTER — Other Ambulatory Visit: Payer: Self-pay | Admitting: Family Medicine

## 2023-11-12 ENCOUNTER — Encounter: Payer: Self-pay | Admitting: Obstetrics and Gynecology

## 2023-11-12 ENCOUNTER — Other Ambulatory Visit (HOSPITAL_COMMUNITY)
Admission: RE | Admit: 2023-11-12 | Discharge: 2023-11-12 | Disposition: A | Discharge status: Home / Self Care | Source: Ambulatory Visit | Attending: Obstetrics and Gynecology | Admitting: Obstetrics and Gynecology

## 2023-11-12 ENCOUNTER — Ambulatory Visit: Payer: Self-pay | Admitting: Obstetrics and Gynecology

## 2023-11-12 VITALS — BP 144/78 | HR 65 | Temp 98.0°F | Ht 65.5 in | Wt 136.0 lb

## 2023-11-12 DIAGNOSIS — Z124 Encounter for screening for malignant neoplasm of cervix: Secondary | ICD-10-CM

## 2023-11-12 DIAGNOSIS — N9089 Other specified noninflammatory disorders of vulva and perineum: Secondary | ICD-10-CM | POA: Diagnosis not present

## 2023-11-12 DIAGNOSIS — Z87410 Personal history of cervical dysplasia: Secondary | ICD-10-CM

## 2023-11-12 DIAGNOSIS — Z8619 Personal history of other infectious and parasitic diseases: Secondary | ICD-10-CM

## 2023-11-12 DIAGNOSIS — Z01419 Encounter for gynecological examination (general) (routine) without abnormal findings: Secondary | ICD-10-CM | POA: Insufficient documentation

## 2023-11-12 DIAGNOSIS — Z9189 Other specified personal risk factors, not elsewhere classified: Secondary | ICD-10-CM

## 2023-11-12 DIAGNOSIS — Z1151 Encounter for screening for human papillomavirus (HPV): Secondary | ICD-10-CM | POA: Diagnosis not present

## 2023-11-12 DIAGNOSIS — R3915 Urgency of urination: Secondary | ICD-10-CM | POA: Diagnosis not present

## 2023-11-12 DIAGNOSIS — N951 Menopausal and female climacteric states: Secondary | ICD-10-CM

## 2023-11-12 LAB — URINALYSIS, COMPLETE W/RFL CULTURE
Bacteria, UA: NONE SEEN /HPF
Bilirubin Urine: NEGATIVE
Glucose, UA: NEGATIVE
Hyaline Cast: NONE SEEN /LPF
Ketones, ur: NEGATIVE
Leukocyte Esterase: NEGATIVE
Nitrites, Initial: NEGATIVE
Protein, ur: NEGATIVE
RBC / HPF: NONE SEEN /HPF (ref 0–2)
Specific Gravity, Urine: 1.015 (ref 1.001–1.035)
WBC, UA: NONE SEEN /HPF (ref 0–5)
pH: 7.5 (ref 5.0–8.0)

## 2023-11-12 LAB — NO CULTURE INDICATED

## 2023-11-12 NOTE — Progress Notes (Signed)
 73 y.o. G0P0000 postmenopausal female s/p TVH (CIN 3) here for annual exam. Married.  Urinary urgency, reports night sweats, and hot flashes during the day off and on.  Stopped patched 2 years ago, noticing more night sweats these days Seborrheic keratosis present on breast and limbs  Postmenopausal bleeding: none Pelvic discharge or pain: none Breast mass, nipple discharge or skin changes : none Last PAP:     Component Value Date/Time   DIAGPAP  11/12/2023 1553    - Negative for intraepithelial lesion or malignancy (NILM)   HPVHIGH Negative 11/12/2023 1553   ADEQPAP Satisfactory for evaluation. 11/12/2023 1553   Last mammogram: 08/22/23 BIRADS 1, density c Last colonoscopy: 05/29/23  Last DXA: 08/04/19, normal Sexually active: yes  Exercising: no Smoker: no  GYN HISTORY: CIN s/p TVH  OB History  Gravida Para Term Preterm AB Living  0    0 0  SAB IAB Ectopic Multiple Live Births          Past Medical History:  Diagnosis Date   Allergy    Cataract 2023   Dr. Vonna Kotyk   CIN III (cervical intraepithelial neoplasia grade III) with severe dysplasia    Dysmenorrhea    Granuloma annulare    Hyperlipidemia    Hypertension    Ovarian cyst     Past Surgical History:  Procedure Laterality Date   CATARACT EXTRACTION Bilateral 2023   COLONOSCOPY  02/17/2014   per Dr. Juanda Chance, adenomatous polyps, repeat in 7 yrs    FOOT SURGERY Left    NASAL SINUS SURGERY     30+ yeras ago   OVARIAN CYST SURGERY     POLYPECTOMY     TA+   TUBAL LIGATION     VAGINAL HYSTERECTOMY      Current Outpatient Medications on File Prior to Visit  Medication Sig Dispense Refill   atenolol-chlorthalidone (TENORETIC) 100-25 MG tablet TAKE 1 TABLET BY MOUTH DAILY 30 tablet 0   atorvastatin (LIPITOR) 40 MG tablet TAKE 1 TABLET BY MOUTH DAILY 90 tablet 3   Calcium-Magnesium-Vitamin D (CALCIUM 1200+D3 PO) Take by mouth.     losartan (COZAAR) 50 MG tablet Take 2 tablets (100 mg total) by mouth  daily. 180 tablet 3   meloxicam (MOBIC) 15 MG tablet TAKE 1 TABLET BY MOUTH DAILY 90 tablet 3   potassium chloride SA (KLOR-CON M) 20 MEQ tablet TAKE 1 TABLET BY MOUTH DAILY 90 tablet 3   zolpidem (AMBIEN) 10 MG tablet TAKE 1 TABLET BY MOUTH AT  BEDTIME AS NEEDED FOR SLEEP 90 tablet 1   No current facility-administered medications on file prior to visit.    Social History   Socioeconomic History   Marital status: Married    Spouse name: Not on file   Number of children: Not on file   Years of education: Not on file   Highest education level: Some college, no degree  Occupational History   Not on file  Tobacco Use   Smoking status: Former    Current packs/day: 0.00    Average packs/day: 1 pack/day for 32.0 years (32.0 ttl pk-yrs)    Types: Cigarettes    Start date: 06/11/1959    Quit date: 06/11/1991    Years since quitting: 32.4   Smokeless tobacco: Never  Vaping Use   Vaping status: Never Used  Substance and Sexual Activity   Alcohol use: Yes    Alcohol/week: 7.0 standard drinks of alcohol    Types: 7 Glasses of wine per week  Comment: daily ( 2)    Drug use: No   Sexual activity: Yes    Partners: Male    Birth control/protection: Surgical    Comment: 1st intercourse- 16, partners-  5, married- 35 yrs   Other Topics Concern   Not on file  Social History Narrative   Not on file   Social Drivers of Health   Financial Resource Strain: Patient Declined (09/03/2023)   Overall Financial Resource Strain (CARDIA)    Difficulty of Paying Living Expenses: Patient declined  Food Insecurity: Patient Declined (09/03/2023)   Hunger Vital Sign    Worried About Running Out of Food in the Last Year: Patient declined    Ran Out of Food in the Last Year: Patient declined  Transportation Needs: No Transportation Needs (09/03/2023)   PRAPARE - Administrator, Civil Service (Medical): No    Lack of Transportation (Non-Medical): No  Physical Activity: Inactive  (09/03/2023)   Exercise Vital Sign    Days of Exercise per Week: 0 days    Minutes of Exercise per Session: 0 min  Stress: No Stress Concern Present (09/03/2023)   [employer] of Occupational Health - Occupational Stress Questionnaire    Feeling of Stress : Only a little  Social Connections: Unknown (09/03/2023)   Social Connection and Isolation Panel [NHANES]    Frequency of Communication with Friends and Family: Patient declined    Frequency of Social Gatherings with Friends and Family: Patient declined    Attends Religious Services: Patient declined    Database administrator or Organizations: No    Attends Engineer, structural: More than 4 times per year    Marital Status: Married  Catering manager Violence: Not At Risk (02/22/2023)   Humiliation, Afraid, Rape, and Kick questionnaire    Fear of Current or Ex-Partner: No    Emotionally Abused: No    Physically Abused: No    Sexually Abused: No    Family History  Problem Relation Age of Onset   Hypertension Mother    Breast cancer Maternal Aunt    Cancer Maternal Aunt        Vulvar Ca in situ   Diabetes Maternal Uncle    Colon cancer Neg Hx    Pancreatic cancer Neg Hx    Stomach cancer Neg Hx    Colon polyps Neg Hx    Esophageal cancer Neg Hx    Rectal cancer Neg Hx     No Known Allergies    PE Today's Vitals   11/12/23 1506  BP: (!) 144/78  Pulse: 65  Temp: 98 F (36.7 C)  TempSrc: Oral  SpO2: 97%  Weight: 136 lb (61.7 kg)  Height: 5' 5.5" (1.664 m)   Body mass index is 22.29 kg/m.  Physical Exam Vitals reviewed. Exam conducted with a chaperone present.  Constitutional:      General: She is not in acute distress.    Appearance: Normal appearance.  HENT:     Head: Normocephalic and atraumatic.     Nose: Nose normal.  Eyes:     Extraocular Movements: Extraocular movements intact.     Conjunctiva/sclera: Conjunctivae normal.  Neck:     Thyroid: No thyroid mass, thyromegaly or thyroid  tenderness.  Pulmonary:     Effort: Pulmonary effort is normal.  Chest:     Chest wall: No mass or tenderness.  Breasts:    Right: Normal. No swelling, mass, nipple discharge or tenderness.     Left: Normal.  No swelling, mass, nipple discharge or tenderness.  Abdominal:     General: There is no distension.     Palpations: Abdomen is soft.     Tenderness: There is no abdominal tenderness.  Genitourinary:    General: Normal vulva.     Exam position: Lithotomy position.     Urethra: No prolapse.     Vagina: Normal. No vaginal discharge or bleeding.     Cervix: No lesion.     Adnexa: Right adnexa normal and left adnexa normal.       Comments: Cervix and uterus absent Raised, hyperpigmented area of right upper vulva Musculoskeletal:        General: Normal range of motion.     Cervical back: Normal range of motion.  Lymphadenopathy:     Upper Body:     Right upper body: No axillary adenopathy.     Left upper body: No axillary adenopathy.     Lower Body: No right inguinal adenopathy. No left inguinal adenopathy.  Skin:    General: Skin is warm and dry.  Neurological:     General: No focal deficit present.     Mental Status: She is alert.  Psychiatric:        Mood and Affect: Mood normal.        Behavior: Behavior normal.       Assessment and Plan:        Well woman exam with routine gynecological exam Assessment & Plan: Cervical cancer screening performed according to ASCCP guidelines. Encouraged annual mammogram screening Colonoscopy UTD DXA UTD Labs and immunizations with her primary Encouraged safe sexual practices as indicated Encouraged healthy lifestyle practices with diet and exercise For patients over 70yo, I recommend 1200mg  calcium daily and 800IU of vitamin D daily.    Urinary urgency -     Urinalysis,Complete w/RFL Culture Discussed lifestyle modifications for management, including avoidance of fluid prior to bed and bladder irritants. Also discussed  regular kegels.  History of cervical dysplasia -     Cytology - PAP  Cervical cancer screening -     Cytology - PAP  Vulvar lesion Assessment & Plan: RTO for punch bx  Orders: -     Biopsy vulva; Future  Vasomotor symptoms due to menopause Assessment & Plan: Briefly discussed management of VMS. Also discussed CBT and meditation as lifestyle modifications that can improve hot flashes. Patient wants to continue expectant management at this time.    Other orders -     REFLEXIVE URINE CULTURE   Rosalyn Gess, MD

## 2023-11-12 NOTE — Patient Instructions (Signed)

## 2023-11-15 ENCOUNTER — Ambulatory Visit (HOSPITAL_COMMUNITY)
Admission: EM | Admit: 2023-11-15 | Discharge: 2023-11-15 | Disposition: A | Discharge status: Home / Self Care | Attending: Family Medicine | Admitting: Family Medicine

## 2023-11-15 ENCOUNTER — Ambulatory Visit (INDEPENDENT_AMBULATORY_CARE_PROVIDER_SITE_OTHER)

## 2023-11-15 ENCOUNTER — Ambulatory Visit: Payer: Self-pay | Admitting: Family Medicine

## 2023-11-15 ENCOUNTER — Encounter (HOSPITAL_COMMUNITY): Payer: Self-pay

## 2023-11-15 DIAGNOSIS — R051 Acute cough: Secondary | ICD-10-CM | POA: Diagnosis not present

## 2023-11-15 DIAGNOSIS — J069 Acute upper respiratory infection, unspecified: Secondary | ICD-10-CM | POA: Diagnosis not present

## 2023-11-15 DIAGNOSIS — R112 Nausea with vomiting, unspecified: Secondary | ICD-10-CM | POA: Diagnosis not present

## 2023-11-15 LAB — CYTOLOGY - PAP
Comment: NEGATIVE
Diagnosis: NEGATIVE
High risk HPV: NEGATIVE

## 2023-11-15 LAB — POC COVID19/FLU A&B COMBO
Covid Antigen, POC: NEGATIVE
Influenza A Antigen, POC: NEGATIVE
Influenza B Antigen, POC: NEGATIVE

## 2023-11-15 LAB — POC RSV: RSV Antigen, POC: NEGATIVE

## 2023-11-15 MED ORDER — ONDANSETRON 4 MG PO TBDP: 4.0000 mg | ORAL_TABLET | Freq: Three times a day (TID) | ORAL | 0 refills | Status: DC | PRN

## 2023-11-15 MED ORDER — ONDANSETRON 4 MG PO TBDP
4.0000 mg | ORAL_TABLET | Freq: Once | ORAL | Status: AC
  Administered 2023-11-15: 4 mg via ORAL

## 2023-11-15 MED ORDER — ONDANSETRON 4 MG PO TBDP
ORAL_TABLET | ORAL | Status: AC
  Filled 2023-11-15: qty 1

## 2023-11-15 MED ORDER — BENZONATATE 200 MG PO CAPS: 200.0000 mg | ORAL_CAPSULE | Freq: Three times a day (TID) | ORAL | 0 refills | Status: DC | PRN

## 2023-11-15 NOTE — ED Provider Notes (Signed)
 [facility]    CSN: [id number] Arrival date & time: 11/15/23  1030      History   Chief Complaint Chief Complaint  Patient presents with   Cough   Nausea    HPI Melody Sutton is a 73 y.o. female.    Cough Associated symptoms: chills, fever, headaches, rhinorrhea and sore throat   Patient is here for URI symptoms x 2 days.  Having cough, congestion, chills, body aches, feeling feverish and nausea.  She has been vomiting.  No wheezing or sob.   She is having pain in her right rib area from coughing.   She feels that her heart is beating faster.  She has been using otc medications with little relief.  No recent sick contacts.        Past Medical History:  Diagnosis Date   Allergy    Cataract 2023   Dr. Vonna Kotyk   CIN III (cervical intraepithelial neoplasia grade III) with severe dysplasia    Dysmenorrhea    Granuloma annulare    Hyperlipidemia    Hypertension    Ovarian cyst     Patient Active Problem List   Diagnosis Date Noted   Vasomotor symptoms due to menopause 11/12/2023   Vulvar lesion 11/12/2023   Well woman exam with routine gynecological exam 11/12/2023   Throat discomfort 08/10/2022   Cough 08/10/2022   COVID-19 virus infection 11/10/2021   History of cervical dysplasia 01/29/2017   Menopausal state 09/22/2012   CIN III (cervical intraepithelial neoplasia grade III) with severe dysplasia    Dysmenorrhea    Ovarian cyst    Granuloma annulare    LIPOMA 06/09/2010   LESION, FACE 06/09/2010   FOOT PAIN, BILATERAL 06/09/2010   Hyperlipemia, mixed 08/28/2007   Essential hypertension 08/28/2007   Allergic rhinitis 08/28/2007   INSOMNIA 08/28/2007   HEADACHE 08/28/2007    Past Surgical History:  Procedure Laterality Date   CATARACT EXTRACTION Bilateral 2023   COLONOSCOPY  02/17/2014   per Dr. Juanda Chance, adenomatous polyps, repeat in 7 yrs    FOOT SURGERY Left    NASAL SINUS SURGERY     30+ yeras ago   OVARIAN CYST SURGERY      POLYPECTOMY     TA+   TUBAL LIGATION     VAGINAL HYSTERECTOMY      OB History     Gravida  0   Para      Term      Preterm      AB  0   Living  0      SAB      IAB      Ectopic      Multiple      Live Births               Home Medications    Prior to Admission medications   Medication Sig Start Date End Date Taking? Authorizing Provider  atenolol-chlorthalidone (TENORETIC) 100-25 MG tablet TAKE 1 TABLET BY MOUTH DAILY 08/29/23  Yes Nelwyn Salisbury, MD  atorvastatin (LIPITOR) 40 MG tablet TAKE 1 TABLET BY MOUTH DAILY 08/01/23  Yes Nelwyn Salisbury, MD  Calcium-Magnesium-Vitamin D (CALCIUM 1200+D3 PO) Take by mouth.   Yes [provider]  losartan (COZAAR) 50 MG tablet Take 2 tablets (100 mg total) by mouth daily. 02/25/23  Yes Nelwyn Salisbury, MD  meloxicam (MOBIC) 15 MG tablet TAKE 1 TABLET BY MOUTH DAILY 05/20/23  Yes Nelwyn Salisbury, MD  potassium chloride  SA (KLOR-CON M) 20 MEQ tablet TAKE 1 TABLET BY MOUTH DAILY 10/03/23  Yes Nelwyn Salisbury, MD  zolpidem (AMBIEN) 10 MG tablet TAKE 1 TABLET BY MOUTH AT  BEDTIME AS NEEDED FOR SLEEP 08/29/23  Yes Nelwyn Salisbury, MD    Family History Family History  Problem Relation Age of Onset   Hypertension Mother    Breast cancer Maternal Aunt    Cancer Maternal Aunt        Vulvar Ca in situ   Diabetes Maternal Uncle    Colon cancer Neg Hx    Pancreatic cancer Neg Hx    Stomach cancer Neg Hx    Colon polyps Neg Hx    Esophageal cancer Neg Hx    Rectal cancer Neg Hx     Social History Social History   Tobacco Use   Smoking status: Former    Current packs/day: 0.00    Average packs/day: 1 pack/day for 32.0 years (32.0 ttl pk-yrs)    Types: Cigarettes    Start date: 06/11/1959    Quit date: 06/11/1991    Years since quitting: 32.4   Smokeless tobacco: Never  Vaping Use   Vaping status: Never Used  Substance Use Topics   Alcohol use: Yes    Alcohol/week: 7.0 standard drinks of alcohol    Types: 7  Glasses of wine per week    Comment: daily ( 2)    Drug use: No     Allergies   Patient has no known allergies.   Review of Systems Review of Systems  Constitutional:  Positive for chills and fever.  HENT:  Positive for congestion, rhinorrhea and sore throat.   Respiratory:  Positive for cough.   Cardiovascular: Negative.   Gastrointestinal: Negative.   Musculoskeletal: Negative.   Neurological:  Positive for headaches.  Psychiatric/Behavioral: Negative.       Physical Exam Triage Vital Signs ED Triage Vitals  Encounter Vitals Group     BP 11/15/23 1113 (!) 174/80     Systolic BP Percentile --      Diastolic BP Percentile --      Pulse Rate 11/15/23 1113 (!) 107     Resp 11/15/23 1113 18     Temp 11/15/23 1113 98.3 F (36.8 C)     Temp Source 11/15/23 1113 Oral     SpO2 11/15/23 1113 95 %     Weight 11/15/23 1113 136 lb (61.7 kg)     Height 11/15/23 1113 5' 5.5" (1.664 m)     Head Circumference --      Peak Flow --      Pain Score 11/15/23 1111 6     Pain Loc --      Pain Education --      Exclude from Growth Chart --    No data found.  Updated Vital Signs BP (!) 174/80 (BP Location: Left Arm)   Pulse (!) 107   Temp 98.3 F (36.8 C) (Oral)   Resp 18   Ht 5' 5.5" (1.664 m)   Wt 61.7 kg   LMP 09/10/1978   SpO2 95%   BMI 22.29 kg/m   Visual Acuity Right Eye Distance:   Left Eye Distance:   Bilateral Distance:    Right Eye Near:   Left Eye Near:    Bilateral Near:     Physical Exam Constitutional:      Appearance: Normal appearance. She is normal weight.  HENT:     Nose: Congestion and rhinorrhea present.  Mouth/Throat:     Mouth: Mucous membranes are moist.     Pharynx: Posterior oropharyngeal erythema present.  Cardiovascular:     Rate and Rhythm: Normal rate and regular rhythm.  Pulmonary:     Effort: Pulmonary effort is normal.     Breath sounds: Normal breath sounds.  Musculoskeletal:        General: Normal range of motion.      Cervical back: Normal range of motion and neck supple. No tenderness.  Skin:    General: Skin is warm.  Neurological:     General: No focal deficit present.     Mental Status: She is alert.     UC Treatments / Results  Labs (all labs ordered are listed, but only abnormal results are displayed) Labs Reviewed  POC COVID19/FLU A&B COMBO    EKG sinus tachy, nonspecific st changes, otherwise normal ekg  Radiology No results found.  Procedures Procedures (including critical care time)  Medications Ordered in UC Medications  ondansetron (ZOFRAN-ODT) disintegrating tablet 4 mg (has no administration in time range)    Initial Impression / Assessment and Plan / UC Course  I have reviewed the triage vital signs and the nursing notes.  Pertinent labs & imaging results that were available during my care of the patient were reviewed by me and considered in my medical decision making (see chart for details).   Final Clinical Impressions(s) / UC Diagnoses   Final diagnoses:  Nausea and vomiting, unspecified vomiting type  Acute cough  Upper respiratory tract infection, unspecified type     Discharge Instructions      You were seen today for upper respiratory symptoms.  Your flu, covid and RSV were negative today.  Your chest xray appears normal.  If the radiologist reads this differently we will call to notify you.  Your symptoms appear to be due to another virus at this time.   I have sent out a medication to help the cough.  You may use tylenol/motrin for body aches and fevers.  I have sent out a medication to help with nausea as well.  If you are not improving, then please return or be seen in the ER for further testing.     ED Prescriptions     Medication Sig Dispense Auth. Provider   ondansetron (ZOFRAN-ODT) 4 MG disintegrating tablet Take 1 tablet (4 mg total) by mouth every 8 (eight) hours as needed for nausea or vomiting. 20 tablet Whitaker Holderman, MD   benzonatate  (TESSALON) 200 MG capsule Take 1 capsule (200 mg total) by mouth 3 (three) times daily as needed for cough. 21 capsule Jannifer Franklin, MD      PDMP not reviewed this encounter.   Jannifer Franklin, MD 11/15/23 1304

## 2023-11-15 NOTE — Discharge Instructions (Signed)
 You were seen today for upper respiratory symptoms.  Your flu, covid and RSV were negative today.  Your chest xray appears normal.  If the radiologist reads this differently we will call to notify you.  Your symptoms appear to be due to another virus at this time.   I have sent out a medication to help the cough.  You may use tylenol/motrin for body aches and fevers.  I have sent out a medication to help with nausea as well.  If you are not improving, then please return or be seen in the ER for further testing.

## 2023-11-15 NOTE — ED Triage Notes (Signed)
 Chief Complaint: cough, congestion, nausea, runny nose, feeling feverish, chills, and body aches. Pain in the right ribs due to the cough. Has been unable to eat.   Sick exposure: Yes- 2 weeks ago was around someone sick  Onset: 2 days   Prescriptions or OTC medications tried: Yes- Cough drops, saline nasal spray, Emergen-C drinks     with little relief  New foods, medications, or products: No  Recent Travel: No

## 2023-11-15 NOTE — Telephone Encounter (Signed)
 Pt is currently at the ED for this problem

## 2023-11-15 NOTE — Telephone Encounter (Signed)
 Chief Complaint: cough Symptoms: cough, chills, headache, congestion/sinus pressure, vomiting, nasal drainage Frequency: 3 days Pertinent Negatives: Patient denies CP, diarrhea Disposition: [] ED /[x] Urgent Care (no appt availability in office) / [] Appointment(In office/virtual)/ []  [facility]/ [] Home Care/ [] Refused Recommended Disposition /[] [facility]/ []  Follow-up with PCP Additional Notes: Patient calls reporting productive cough with yellow green phlegm x 3 days. States she feels a lot of sinus pressure, headache, congestion, some episodes of vomiting, possible fever. Per protocol, patient to be evaluated within 4 hours. First available appointment with PCP ior any provider in clinic outside of guideline. Patient declines to be seen in other clinic due to driving distance, declines VV. States she will go to UC on [address]. Provided patient with address for UC- states she is going to walk in now. Care advice reviewed, patient verbalized understanding and denies further questions at this time. Alerting PCP for review.    Copied from CRM [id number]. Topic: Clinical - Red Word Triage >> Nov 15, 2023  9:31 AM Efraim Kaufmann C wrote: Red Word that prompted transfer to Nurse Triage: patient has been sick since Wednesday. Horrible cough, drainage with mucus now changing color, headache, chest pain, she thinks she has a fever but her thermometer is broken. Reason for Disposition  [1] MILD difficulty breathing (e.g., minimal/no SOB at rest, SOB with walking, pulse <100) AND [2] still present when not coughing  Answer Assessment - Initial Assessment Questions 1. ONSET: "When did the cough begin?"      11/13/23 2. SEVERITY: "How bad is the cough today?"      Not as bad as yesterday- used cough drops and emergen-C, tylenol 3. SPUTUM: "Describe the color of your sputum" (none, dry cough; clear, white, yellow, green)     Green, yellow 4. HEMOPTYSIS: "Are you coughing up any blood?" If so  ask: "How much?" (flecks, streaks, tablespoons, etc.)     Denies 5. DIFFICULTY BREATHING: "Are you having difficulty breathing?" If Yes, ask: "How bad is it?" (e.g., mild, moderate, severe)    - MILD: No SOB at rest, mild SOB with walking, speaks normally in sentences, can lie down, no retractions, pulse < 100.    - MODERATE: SOB at rest, SOB with minimal exertion and prefers to sit, cannot lie down flat, speaks in phrases, mild retractions, audible wheezing, pulse 100-120.    - SEVERE: Very SOB at rest, speaks in single words, struggling to breathe, sitting hunched forward, retractions, pulse > 120      Mild 6. FEVER: "Do you have a fever?" If Yes, ask: "What is your temperature, how was it measured, and when did it start?"     Thinks yes, low grade, thermometer broken. 7. CARDIAC HISTORY: "Do you have any history of heart disease?" (e.g., heart attack, congestive heart failure)      Denies 8. LUNG HISTORY: "Do you have any history of lung disease?"  (e.g., pulmonary embolus, asthma, emphysema)     Denies 9. PE RISK FACTORS: "Do you have a history of blood clots?" (or: recent major surgery, recent prolonged travel, bedridden)     Denies 10. OTHER SYMPTOMS: "Do you have any other symptoms?" (e.g., runny nose, wheezing, chest pain)       Head congestion, nasal drainage, chest discomfort from coughing, hot and cold, vomiting 11. PREGNANCY: "Is there any chance you are pregnant?" "When was your last menstrual period?"       NA 12. TRAVEL: "Have you traveled out of the country in the  last month?" (e.g., travel history, exposures)       Denies  Protocols used: Cough - Acute Non-Productive-A-AH

## 2023-11-18 ENCOUNTER — Encounter: Payer: Self-pay | Admitting: Obstetrics and Gynecology

## 2023-11-19 ENCOUNTER — Encounter: Payer: Self-pay | Admitting: Obstetrics and Gynecology

## 2023-11-19 NOTE — Assessment & Plan Note (Signed)
 Briefly discussed management of VMS. Also discussed CBT and meditation as lifestyle modifications that can improve hot flashes. Patient wants to continue expectant management at this time.

## 2023-11-19 NOTE — Assessment & Plan Note (Signed)
 RTO for punch bx

## 2023-11-19 NOTE — Assessment & Plan Note (Signed)
 Cervical cancer screening performed according to ASCCP guidelines. Encouraged annual mammogram screening Colonoscopy UTD DXA UTD Labs and immunizations with her primary Encouraged safe sexual practices as indicated Encouraged healthy lifestyle practices with diet and exercise For patients over 74yo, I recommend 1200mg  calcium daily and 800IU of vitamin D daily.

## 2023-11-22 ENCOUNTER — Telehealth: Payer: Self-pay | Admitting: *Deleted

## 2023-11-22 ENCOUNTER — Encounter: Payer: Self-pay | Admitting: Family Medicine

## 2023-11-22 ENCOUNTER — Ambulatory Visit: Admitting: Family Medicine

## 2023-11-22 VITALS — BP 138/80 | HR 68 | Temp 98.1°F | Wt 128.2 lb

## 2023-11-22 DIAGNOSIS — J40 Bronchitis, not specified as acute or chronic: Secondary | ICD-10-CM

## 2023-11-22 MED ORDER — AZITHROMYCIN 250 MG PO TABS: ORAL_TABLET | ORAL | 0 refills | Status: DC

## 2023-11-22 MED ORDER — HYDROCODONE BIT-HOMATROP MBR 5-1.5 MG/5ML PO SOLN: 5.0000 mL | ORAL | 0 refills | Status: DC | PRN

## 2023-11-22 NOTE — Telephone Encounter (Signed)
Left pt a message to call the office back regarding this encounter

## 2023-11-22 NOTE — Telephone Encounter (Signed)
 Copied from CRM [id number]. Topic: Clinical - Prescription Issue >> Nov 22, 2023  1:35 PM Melody Sutton wrote: Reason for CRM:  [facility] on the medication Dr. Clent Ridges prescribed her today ( HYDROcodone bit-homatropine (HYCODAN) 5-1.5 MG/5ML syrup )  *Patient is requesting a phone call from Dr.Frys nurse to give her advise as [facility] is also out of stock*

## 2023-11-22 NOTE — Telephone Encounter (Signed)
 Copied from CRM [id number]. Topic: General - Other >> Nov 22, 2023  3:35 PM Gurney Maxin H wrote: Reason for CRM: Patient returning call to Harriett Sine at the office regarding CRM that was sent, please reach back out to patient, thanks.  [place] [phone]

## 2023-11-22 NOTE — Progress Notes (Signed)
   Subjective:    Patient ID: Melody Sutton, female    DOB: [birth year], 73 y.o.   MRN: [id number]  HPI Here for 10 days of headache, body aches, ST, and a cough. She has had 2 episodes of diarrhea. She has vomited once. She went to urgent care on 11-15-23 where she had a normal exam and a normal CXR. She tested negative for Covid and flu. She was told she had a virus, and she was given Benzonatate and Zofren. Since then her cough has begun to produce green sputum. No SOB. She is drinking fluids.    Review of Systems  Constitutional:  Positive for fatigue. Negative for fever.  HENT:  Positive for congestion and sore throat. Negative for ear pain, postnasal drip and sinus pain.   Eyes: Negative.   Respiratory:  Positive for cough and chest tightness. Negative for shortness of breath and wheezing.        Objective:   Physical Exam Constitutional:      Appearance: Normal appearance.  HENT:     Right Ear: Tympanic membrane, ear canal and external ear normal.     Left Ear: Tympanic membrane, ear canal and external ear normal.     Nose: Nose normal.     Mouth/Throat:     Pharynx: Oropharynx is clear.  Eyes:     Conjunctiva/sclera: Conjunctivae normal.  Pulmonary:     Effort: Pulmonary effort is normal.     Breath sounds: Rhonchi present. No wheezing or rales.  Lymphadenopathy:     Cervical: No cervical adenopathy.  Neurological:     Mental Status: She is alert.           Assessment & Plan:  She has developed a bronchitis on top of a viral illness. We will treat this with a Zpack. Use Hycodan as needed for the cough. We spent a total of (32   ) minutes reviewing records and discussing these issues.  Gershon Crane, MD

## 2023-11-27 ENCOUNTER — Encounter: Payer: Self-pay | Admitting: Obstetrics and Gynecology

## 2023-11-27 ENCOUNTER — Ambulatory Visit (INDEPENDENT_AMBULATORY_CARE_PROVIDER_SITE_OTHER): Admitting: Obstetrics and Gynecology

## 2023-11-27 ENCOUNTER — Other Ambulatory Visit (HOSPITAL_COMMUNITY)
Admission: RE | Admit: 2023-11-27 | Discharge: 2023-11-27 | Disposition: A | Discharge status: Home / Self Care | Source: Ambulatory Visit | Attending: Obstetrics and Gynecology | Admitting: Obstetrics and Gynecology

## 2023-11-27 ENCOUNTER — Other Ambulatory Visit: Payer: Self-pay

## 2023-11-27 VITALS — BP 138/60 | HR 76 | Temp 97.9°F | Wt 127.0 lb

## 2023-11-27 DIAGNOSIS — N9089 Other specified noninflammatory disorders of vulva and perineum: Secondary | ICD-10-CM | POA: Insufficient documentation

## 2023-11-27 MED ORDER — LIDOCAINE HCL (PF) 1 % IJ SOLN: 10.0000 mL | Freq: Once | INTRAMUSCULAR | Status: DC

## 2023-11-27 MED ORDER — HYDROCODONE BIT-HOMATROP MBR 5-1.5 MG/5ML PO SOLN: 5.0000 mL | ORAL | 0 refills | Status: DC | PRN

## 2023-11-27 MED ORDER — LIDOCAINE-EPINEPHRINE 1 %-1:100000 IJ SOLN
2.0000 mL | Freq: Once | INTRAMUSCULAR | Status: AC
Start: 2023-11-27 — End: ?

## 2023-11-27 NOTE — Patient Instructions (Signed)
 It is common to have vaginal bleeding and cramping for up to 72 hours after your biopsy. Please call our office with heavy vaginal bleeding, severe abdominal pain or fever. Avoid intercourse, tampon use, douching and baths for 7 days to decrease the risk of infection.

## 2023-11-27 NOTE — Telephone Encounter (Signed)
 Done

## 2023-11-27 NOTE — Telephone Encounter (Signed)
 Pt states that her pharmacy did not have the Hydrocodone cough syrup in stock. Called [facility] [address] state that they only have 100 mL available. Pt wants Rx sent in since she is still coughing. Please advise

## 2023-11-27 NOTE — Addendum Note (Signed)
 Addended by: Gershon Crane A on: 11/27/2023 05:08 PM   Modules accepted: Orders

## 2023-11-27 NOTE — Progress Notes (Signed)
 73 y.o. G0P0000 female postmenopausal female s/p TVH (CIN 3) here for vulvar biopsy. Married.   Patient's last menstrual period was 09/10/1978.   Seborrheic keratosis present on breast and limbs.  GYN HISTORY: CIN s/p TVH   OB History  Gravida Para Term Preterm AB Living  0    0 0  SAB IAB Ectopic Multiple Live Births          Past Medical History:  Diagnosis Date   Allergy    Cataract 2023   Dr. Vonna Kotyk   CIN III (cervical intraepithelial neoplasia grade III) with severe dysplasia    Dysmenorrhea    Granuloma annulare    Hyperlipidemia    Hypertension    Ovarian cyst     Past Surgical History:  Procedure Laterality Date   CATARACT EXTRACTION Bilateral 2023   COLONOSCOPY  02/17/2014   per Dr. Juanda Chance, adenomatous polyps, repeat in 7 yrs    FOOT SURGERY Left    NASAL SINUS SURGERY     30+ yeras ago   OVARIAN CYST SURGERY     POLYPECTOMY     TA+   TUBAL LIGATION     VAGINAL HYSTERECTOMY      Current Outpatient Medications on File Prior to Visit  Medication Sig Dispense Refill   Acetaminophen (TYLENOL PO) Take by mouth daily.     atenolol-chlorthalidone (TENORETIC) 100-25 MG tablet TAKE 1 TABLET BY MOUTH DAILY 30 tablet 0   atorvastatin (LIPITOR) 40 MG tablet TAKE 1 TABLET BY MOUTH DAILY 90 tablet 3   Calcium-Magnesium-Vitamin D (CALCIUM 1200+D3 PO) Take by mouth.     HYDROcodone bit-homatropine (HYCODAN) 5-1.5 MG/5ML syrup Take 5 mLs by mouth every 4 (four) hours as needed. 240 mL 0   losartan (COZAAR) 50 MG tablet Take 2 tablets (100 mg total) by mouth daily. 180 tablet 3   meloxicam (MOBIC) 15 MG tablet TAKE 1 TABLET BY MOUTH DAILY 90 tablet 3   potassium chloride SA (KLOR-CON M) 20 MEQ tablet TAKE 1 TABLET BY MOUTH DAILY 90 tablet 3   zolpidem (AMBIEN) 10 MG tablet TAKE 1 TABLET BY MOUTH AT  BEDTIME AS NEEDED FOR SLEEP 90 tablet 1   azithromycin (ZITHROMAX Z-PAK) 250 MG tablet As directed 6 each 0   benzonatate (TESSALON) 200 MG capsule Take 1 capsule (200 mg  total) by mouth 3 (three) times daily as needed for cough. (Patient not taking: Reported on 11/27/2023) 21 capsule 0   No current facility-administered medications on file prior to visit.    No Known Allergies    PE Today's Vitals   11/27/23 1431  BP: 138/60  Pulse: 76  Temp: 97.9 F (36.6 C)  TempSrc: Oral  SpO2: 96%  Weight: 127 lb (57.6 kg)   Body mass index is 20.81 kg/m.  Physical Exam Vitals reviewed. Exam conducted with a chaperone present.  Constitutional:      General: She is not in acute distress.    Appearance: Normal appearance.  HENT:     Head: Normocephalic and atraumatic.     Nose: Nose normal.  Eyes:     Extraocular Movements: Extraocular movements intact.     Conjunctiva/sclera: Conjunctivae normal.  Pulmonary:     Effort: Pulmonary effort is normal.  Abdominal:     Palpations: Abdomen is soft.  Genitourinary:    General: Normal vulva.     Exam position: Lithotomy position.     Urethra: No prolapse.       Comments: Raised, hyperpigmented area of right  upper vulva Musculoskeletal:        General: Normal range of motion.     Cervical back: Normal range of motion.  Lymphadenopathy:     Lower Body: No right inguinal adenopathy. No left inguinal adenopathy.  Skin:    General: Skin is warm and dry.  Neurological:     General: No focal deficit present.     Mental Status: She is alert.  Psychiatric:        Mood and Affect: Mood normal.        Behavior: Behavior normal.       Procedure: Indication for biopsy: vulvar lesion  The indications for vulvar biopsy were reviewed. Risks of the biopsy including pain, bleeding, infection, inadequate specimen, and need for additional procedures were discussed. The patient stated understanding and agreed to undergo procedure today. Consent was signed, and an adequate time out performed.   The patient's vulva was prepped with Betadine. 1% lidocaine with epinephrine was injected into the upper right vulva. A  3-mm punch biopsy was done at 10:00, biopsy tissue was picked up with sterile forceps and sterile scissors were used to excise the lesion.  Small bleeding was noted and hemostasis was achieved using silver nitrate sticks.    The patient tolerated the procedure well.   Assessment and Plan:        Vulvar lesion -     Biopsy vulva -     Surgical pathology -     Lidocaine-EPINEPHrine   Will call with results RTO for bleeding, pain, or any other concerns  Rosalyn Gess, MD

## 2023-11-29 LAB — SURGICAL PATHOLOGY

## 2023-12-11 ENCOUNTER — Other Ambulatory Visit: Payer: Self-pay | Admitting: Family Medicine

## 2023-12-11 DIAGNOSIS — I10 Essential (primary) hypertension: Secondary | ICD-10-CM

## 2024-02-26 ENCOUNTER — Ambulatory Visit (INDEPENDENT_AMBULATORY_CARE_PROVIDER_SITE_OTHER): Payer: Medicare Other

## 2024-02-26 VITALS — BP 110/62 | HR 60 | Temp 98.6°F | Ht 65.5 in | Wt 130.5 lb

## 2024-02-26 DIAGNOSIS — Z0000 Encounter for general adult medical examination without abnormal findings: Secondary | ICD-10-CM | POA: Diagnosis not present

## 2024-02-26 NOTE — Progress Notes (Signed)
 Subjective:   Melody Sutton is a 73 y.o. who presents for a Medicare Wellness preventive visit.  As a reminder, Annual Wellness Visits don't include a physical exam, and some assessments may be limited, especially if this visit is performed virtually. We may recommend an in-person follow-up visit with your provider if needed.  Visit Complete: In person    Persons Participating in Visit: Patient.  AWV Questionnaire: Yes: Patient Medicare AWV questionnaire was completed by the patient on 02/26/24; I have confirmed that all information answered by patient is correct and no changes since this date.  Cardiac Risk Factors include: advanced age (>16men, >40 women);hypertension     Objective:    Today's Vitals   02/26/24 1346  BP: 110/62  Pulse: 60  Temp: 98.6 F (37 C)  TempSrc: Oral  SpO2: 98%  Weight: 130 lb 8 oz (59.2 kg)  Height: 5' 5.5 (1.664 m)   Body mass index is 21.39 kg/m.     02/26/2024    2:03 PM 02/22/2023    1:55 PM 02/16/2022   11:36 AM 11/18/2020    2:20 PM 02/07/2018    4:36 PM 02/03/2014    4:37 PM  Advanced Directives  Does Patient Have a Medical Advance Directive? Yes Yes Yes Yes Yes  Patient has advance directive, copy not in chart   Type of Advance Directive Healthcare Power of [place];Living will Healthcare Power of [place];Living will Healthcare Power of [place];Living will Healthcare Power of [place];Living will    Does patient want to make changes to medical advance directive?   No - Patient declined No - Patient declined  No change requested   Copy of Healthcare Power of Attorney in Chart? No - copy requested No - copy requested No - copy requested No - copy requested    Pre-existing out of facility DNR order (yellow form or pink MOST form)      No      Data saved with a previous flowsheet row definition    Current Medications (verified) Outpatient Encounter Medications as of 02/26/2024  Medication Sig   Acetaminophen (TYLENOL PO) Take by  mouth daily.   atenolol -chlorthalidone  (TENORETIC ) 100-25 MG tablet TAKE 1 TABLET BY MOUTH DAILY   atorvastatin  (LIPITOR) 40 MG tablet TAKE 1 TABLET BY MOUTH DAILY   Calcium -Magnesium-Vitamin D  (CALCIUM  1200+D3 PO) Take by mouth.   HYDROcodone  bit-homatropine (HYCODAN) 5-1.5 MG/5ML syrup Take 5 mLs by mouth every 4 (four) hours as needed for cough.   losartan  (COZAAR ) 50 MG tablet TAKE 2 TABLETS BY MOUTH DAILY   meloxicam  (MOBIC ) 15 MG tablet TAKE 1 TABLET BY MOUTH DAILY   potassium chloride  SA (KLOR-CON  M) 20 MEQ tablet TAKE 1 TABLET BY MOUTH DAILY   zolpidem  (AMBIEN ) 10 MG tablet TAKE 1 TABLET BY MOUTH AT  BEDTIME AS NEEDED FOR SLEEP   Facility-Administered Encounter Medications as of 02/26/2024  Medication   lidocaine -EPINEPHrine  (XYLOCAINE  W/EPI) 1 %-1:100000 (with pres) injection 2 mL    Allergies (verified) Patient has no known allergies.   History: Past Medical History:  Diagnosis Date   Allergy    Cataract 2023   Dr. Lenna Quinton   CIN III (cervical intraepithelial neoplasia grade III) with severe dysplasia    Dysmenorrhea    Granuloma annulare    Hyperlipidemia    Hypertension    Ovarian cyst    Past Surgical History:  Procedure Laterality Date   CATARACT EXTRACTION Bilateral 2023   COLONOSCOPY  02/17/2014   per Dr. Grandville Lax, adenomatous polyps,  repeat in 7 yrs    FOOT SURGERY Left    NASAL SINUS SURGERY     30+ yeras ago   OVARIAN CYST SURGERY     POLYPECTOMY     TA+   TUBAL LIGATION     VAGINAL HYSTERECTOMY     Family History  Problem Relation Age of Onset   Hypertension Mother    Breast cancer Maternal Aunt    Cancer Maternal Aunt        Vulvar Ca in situ   Diabetes Maternal Uncle    Colon cancer Neg Hx    Pancreatic cancer Neg Hx    Stomach cancer Neg Hx    Colon polyps Neg Hx    Esophageal cancer Neg Hx    Rectal cancer Neg Hx    Social History   Socioeconomic History   Marital status: Married    Spouse name: Not on file   Number of children: Not on  file   Years of education: Not on file   Highest education level: Some college, no degree  Occupational History   Not on file  Tobacco Use   Smoking status: Former    Current packs/day: 0.00    Average packs/day: 1 pack/day for 32.0 years (32.0 ttl pk-yrs)    Types: Cigarettes    Start date: 06/11/1959    Quit date: 06/11/1991    Years since quitting: 32.7   Smokeless tobacco: Never  Vaping Use   Vaping status: Never Used  Substance and Sexual Activity   Alcohol use: Yes    Alcohol/week: 7.0 standard drinks of alcohol    Types: 7 Glasses of wine per week    Comment: daily ( 2)    Drug use: No   Sexual activity: Yes    Partners: Male    Birth control/protection: Surgical    Comment: 1st intercourse- 16, partners-  5, married- 35 yrs   Other Topics Concern   Not on file  Social History Narrative   Not on file   Social Drivers of Health   Financial Resource Strain: Low Risk  (02/26/2024)   Overall Financial Resource Strain (CARDIA)    Difficulty of Paying Living Expenses: Not hard at all  Food Insecurity: No Food Insecurity (02/26/2024)   Hunger Vital Sign    Worried About Running Out of Food in the Last Year: Never true    Ran Out of Food in the Last Year: Never true  Transportation Needs: No Transportation Needs (02/26/2024)   PRAPARE - Administrator, Civil Service (Medical): No    Lack of Transportation (Non-Medical): No  Physical Activity: Inactive (02/26/2024)   Exercise Vital Sign    Days of Exercise per Week: 0 days    Minutes of Exercise per Session: 0 min  Stress: No Stress Concern Present (02/26/2024)   [employer] of Occupational Health - Occupational Stress Questionnaire    Feeling of Stress: Not at all  Social Connections: Moderately Isolated (02/26/2024)   Social Connection and Isolation Panel    Frequency of Communication with Friends and Family: More than three times a week    Frequency of Social Gatherings with Friends and Family: More  than three times a week    Attends Religious Services: Never    Database administrator or Organizations: No    Attends Banker Meetings: Never    Marital Status: Married    Tobacco Counseling Counseling given: Not Answered    Clinical Intake:  Pre-visit  preparation completed: Yes  Pain : No/denies pain     BMI - recorded: 21.39 Nutritional Status: BMI of 19-24  Normal Nutritional Risks: None Diabetes: No  Lab Results  Component Value Date   HGBA1C 5.8 02/25/2023   HGBA1C 5.8 08/09/2021     How often do you need to have someone help you when you read instructions, pamphlets, or other written materials from your doctor or pharmacy?: 1 - Never  Interpreter Needed?: No  Information entered by :: Farris Hong LPN   Activities of Daily Living     02/26/2024    2:00 PM 02/26/2024   10:07 AM  In your present state of health, do you have any difficulty performing the following activities:  Hearing? 1 1  Vision? 1 1  Difficulty concentrating or making decisions? 0 0  Walking or climbing stairs? 1 1  Comment Due to Arthritis pain. Followed by medical attention   Dressing or bathing? 0 0  Doing errands, shopping? 0 0  Preparing Food and eating ? N N  Using the Toilet? N N  In the past six months, have you accidently leaked urine? N N  Do you have problems with loss of bowel control? N N  Managing your Medications? N N  Managing your Finances? N N  Housekeeping or managing your Housekeeping? N N    Patient Care Team: Donley Furth, MD as PCP - General  I have updated your Care Teams any recent Medical Services you may have received from other providers in the past year.     Assessment:   This is a routine wellness examination for Malee.  Hearing/Vision screen Hearing Screening - Comments:: Denies hearing difficulties   Vision Screening - Comments:: Wears rx glasses - up to date with routine eye exams with  Dr Jorge Newcomer   Goals Addressed                This Visit's Progress     Increase physical activity (pt-stated)        Get more active       Depression Screen     02/26/2024    1:49 PM 11/12/2023    3:19 PM 02/22/2023    1:33 PM 02/16/2022   11:30 AM 10/18/2021    4:08 PM 08/09/2021   11:23 AM 11/18/2020    2:27 PM  PHQ 2/9 Scores  PHQ - 2 Score 0 0 0 0 0 0 2  PHQ- 9 Score     3 0 9    Fall Risk     02/26/2024    2:02 PM 02/26/2024   10:07 AM 11/12/2023    3:19 PM 02/22/2023    1:54 PM 02/19/2023    9:54 AM  Fall Risk   Falls in the past year? 0 0 0 0 0  Number falls in past yr: 0 0 0 0   Injury with Fall? 0 0 0 0   Risk for fall due to : No Fall Risks  No Fall Risks No Fall Risks   Follow up Falls evaluation completed  Falls evaluation completed Falls prevention discussed     MEDICARE RISK AT HOME:  Medicare Risk at Home Any stairs in or around the home?: Yes If so, are there any without handrails?: No Home free of loose throw rugs in walkways, pet beds, electrical cords, etc?: Yes Adequate lighting in your home to reduce risk of falls?: Yes Life alert?: No Use of a cane, walker or w/c?: No  Grab bars in the bathroom?: No Shower chair or bench in shower?: No Elevated toilet seat or a handicapped toilet?: No  TIMED UP AND GO:  Was the test performed?  Yes  Length of time to ambulate 10 feet: 10 sec Gait steady and fast without use of assistive device  Cognitive Function: 6CIT completed    02/07/2018    4:40 PM  MMSE - Mini Mental State Exam  Not completed: --        02/26/2024    2:03 PM 02/22/2023    1:55 PM 02/16/2022   11:36 AM  6CIT Screen  What Year? 0 points 0 points 0 points  What month? 0 points 0 points 0 points  What time? 0 points 0 points 0 points  Count back from 20 0 points 0 points 0 points  Months in reverse 0 points 0 points 0 points  Repeat phrase 0 points 0 points 0 points  Total Score 0 points 0 points 0 points    Immunizations Immunization History  Administered Date(s)  Administered   Fluad Quad(high Dose 65+) 05/29/2019, 06/02/2020   Influenza Split 08/03/2011   Influenza Whole 09/05/2009, 06/09/2010   Influenza, High Dose Seasonal PF 07/24/2018   PFIZER(Purple Top)SARS-COV-2 Vaccination 11/05/2019, 12/02/2019, 07/23/2020   Pneumococcal Polysaccharide-23 01/23/2019, 06/02/2020   Td 09/06/2010   Zoster Recombinant(Shingrix) 03/24/2021   Zoster, Live 03/11/2017    Screening Tests Health Maintenance  Topic Date Due   Zoster Vaccines- Shingrix (2 of 2) 05/19/2021   Pneumococcal Vaccine: 50+ Years (2 of 2 - PCV) 06/02/2021   COVID-19 Vaccine (4 - 2024-25 season) 05/12/2023   INFLUENZA VACCINE  04/10/2024   MAMMOGRAM  08/21/2024   Medicare Annual Wellness (AWV)  02/25/2025   DEXA SCAN  Completed   Hepatitis C Screening  Completed   HPV VACCINES  Aged Out   Meningococcal B Vaccine  Aged Out   DTaP/Tdap/Td  Discontinued   Colonoscopy  Discontinued    Health Maintenance  Health Maintenance Due  Topic Date Due   Zoster Vaccines- Shingrix (2 of 2) 05/19/2021   Pneumococcal Vaccine: 50+ Years (2 of 2 - PCV) 06/02/2021   COVID-19 Vaccine (4 - 2024-25 season) 05/12/2023   Health Maintenance Items Addressed:   Additional Screening:  Vision Screening: Recommended annual ophthalmology exams for early detection of glaucoma and other disorders of the eye. Would you like a referral to an eye doctor? No    Dental Screening: Recommended annual dental exams for proper oral hygiene  Community Resource Referral / Chronic Care Management: CRR required this visit?  No   CCM required this visit?  No   Plan:    I have personally reviewed and noted the following in the patient's chart:   Medical and social history Use of alcohol, tobacco or illicit drugs  Current medications and supplements including opioid prescriptions. Patient is not currently taking opioid prescriptions. Functional ability and status Nutritional status Physical  activity Advanced directives List of other physicians Hospitalizations, surgeries, and ER visits in previous 12 months Vitals Screenings to include cognitive, depression, and falls Referrals and appointments  In addition, I have reviewed and discussed with patient certain preventive protocols, quality metrics, and best practice recommendations. A written personalized care plan for preventive services as well as general preventive health recommendations were provided to patient.   Dewayne Ford, LPN   1/61/0960   After Visit Summary: (In Person-Printed) AVS printed and given to the patient  Notes: Nothing significant to report at this  time.

## 2024-02-26 NOTE — Patient Instructions (Addendum)
 Ms. Melody Sutton , Thank you for taking time out of your busy schedule to complete your Annual Wellness Visit with me. I enjoyed our conversation and look forward to speaking with you again next year. I, as well as your care team,  appreciate your ongoing commitment to your health goals. Please review the following plan we discussed and let me know if I can assist you in the future. Your Game plan/ To Do List    Referrals: If you haven't heard from the office you've been referred to, please reach out to them at the phone provided.    Follow up Visits: Next Medicare AWV with our clinical staff: 03/03/25 @ 1:40p   Have you seen your provider in the last 6 months (3 months if uncontrolled diabetes)? 11/22/23 Next Office Visit with your provider:   Clinician Recommendations:  Aim for 30 minutes of exercise or brisk walking, 6-8 glasses of water, and 5 servings of fruits and vegetables each day.        This is a list of the screening recommended for you and due dates:  Health Maintenance  Topic Date Due   Zoster (Shingles) Vaccine (2 of 2) 05/19/2021   Pneumococcal Vaccine for age over 22 (2 of 2 - PCV) 06/02/2021   COVID-19 Vaccine (4 - 2024-25 season) 05/12/2023   Flu Shot  04/10/2024   Mammogram  08/21/2024   Medicare Annual Wellness Visit  02/25/2025   DEXA scan (bone density measurement)  Completed   Hepatitis C Screening  Completed   HPV Vaccine  Aged Out   Meningitis B Vaccine  Aged Out   DTaP/Tdap/Td vaccine  Discontinued   Colon Cancer Screening  Discontinued    Advanced directives: (Copy Requested) Please bring a copy of your health care power of attorney and living will to the office to be added to your chart at your convenience. You can mail to [facility] [address] [place], [place] [address] or email to [email or web address] Advance Care Planning is important because it:  [x]  Makes sure you receive the medical care that is consistent with your values,  goals, and preferences  [x]  It provides guidance to your family and loved ones and reduces their decisional burden about whether or not they are making the right decisions based on your wishes.  Follow the link provided in your after visit summary or read over the paperwork we have mailed to you to help you started getting your Advance Directives in place. If you need assistance in completing these, please reach out to us  so that we can help you!  See attachments for Preventive Care and Fall Prevention Tips.

## 2024-02-29 ENCOUNTER — Other Ambulatory Visit: Payer: Self-pay | Admitting: Family Medicine

## 2024-03-02 NOTE — Telephone Encounter (Signed)
 Pt LOV was on 11/22/23 Last refill was done on 08/29/23 Please advise

## 2024-03-04 ENCOUNTER — Other Ambulatory Visit: Payer: Self-pay | Admitting: Family Medicine

## 2024-03-04 DIAGNOSIS — I10 Essential (primary) hypertension: Secondary | ICD-10-CM

## 2024-03-19 ENCOUNTER — Ambulatory Visit: Payer: Self-pay

## 2024-03-19 ENCOUNTER — Encounter: Payer: Self-pay | Admitting: Emergency Medicine

## 2024-03-19 ENCOUNTER — Ambulatory Visit: Admitting: Emergency Medicine

## 2024-03-19 VITALS — BP 180/94 | HR 62 | Temp 98.4°F | Ht 65.5 in | Wt 131.0 lb

## 2024-03-19 DIAGNOSIS — I10 Essential (primary) hypertension: Secondary | ICD-10-CM

## 2024-03-19 MED ORDER — VALSARTAN 320 MG PO TABS: 320.0000 mg | ORAL_TABLET | Freq: Every day | ORAL | 3 refills | Status: DC

## 2024-03-19 NOTE — Assessment & Plan Note (Signed)
 BP Readings from Last 3 Encounters:  03/19/24 (!) 180/94  02/26/24 110/62  11/27/23 138/60  Uncontrolled hypertension Clinically stable.  Asymptomatic. Cardiovascular risks associated with uncontrolled hypertension discussed Diet and nutrition discussed Recommend to continue Tenoretic  100-25 mg daily Recommend to stop losartan  and start valsartan  320 mg daily ED precautions given Advised to follow-up with PCP next week

## 2024-03-19 NOTE — Telephone Encounter (Signed)
 FYI Only or Action Required?: FYI only for provider.  Patient was last seen in primary care on 11/22/2023 by Melody Sutton LABOR, MD.  Called Nurse Triage reporting Hypertension.  Symptoms began today.  Interventions attempted: Prescription medications: Losartan .  Symptoms are: gradually worsening.  Triage Disposition: See HCP Within 4 Hours (Or PCP Triage)  Patient/caregiver understands and will follow disposition?: Yes   **Pt. Is currently at office wanting a B/P check**          Copied from CRM [id number]. Topic: Clinical - Red Word Triage >> Mar 19, 2024  1:16 PM Cleave MATSU wrote: Red Word that prompted transfer to Nurse Triage: pt is having high blood pressure Reason for Disposition  [1] Systolic BP >= 200 OR Diastolic >= 120 AND [2] having NO cardiac or neurologic symptoms  Answer Assessment - Initial Assessment Questions 1. BLOOD PRESSURE: What is your blood pressure? Did you take at least two measurements 5 minutes apart?     190/122   2. ONSET: When did you take your blood pressure?      45 minutes  3. HOW: How did you take your blood pressure? (e.g., automatic home BP monitor, visiting nurse)     Home B/P cuff  4. HISTORY: Do you have a history of high blood pressure?     HTN  5. MEDICINES: Are you taking any medicines for blood pressure? Have you missed any doses recently?      Yes, as prescribed, this AM  6. OTHER SYMPTOMS: Do you have any symptoms? (e.g., blurred vision, chest pain, difficulty breathing, headache, weakness)     No    Patient is at [facility] currently  Protocols used: Blood Pressure - High-A-AH

## 2024-03-19 NOTE — Patient Instructions (Signed)
 Continue Tenoretic  Stop losartan  Start valsartan  Continue monitoring blood pressure readings at home daily for the next several weeks and follow-up with your PCP within the next 2 weeks.  Hypertension, Adult High blood pressure (hypertension) is when the force of blood pumping through the arteries is too strong. The arteries are the blood vessels that carry blood from the heart throughout the body. Hypertension forces the heart to work harder to pump blood and may cause arteries to become narrow or stiff. Untreated or uncontrolled hypertension can lead to a heart attack, heart failure, a stroke, kidney disease, and other problems. A blood pressure reading consists of a higher number over a lower number. Ideally, your blood pressure should be below 120/80. The first (top) number is called the systolic pressure. It is a measure of the pressure in your arteries as your heart beats. The second (bottom) number is called the diastolic pressure. It is a measure of the pressure in your arteries as the heart relaxes. What are the causes? The exact cause of this condition is not known. There are some conditions that result in high blood pressure. What increases the risk? Certain factors may make you more likely to develop high blood pressure. Some of these risk factors are under your control, including: Smoking. Not getting enough exercise or physical activity. Being overweight. Having too much fat, sugar, calories, or salt (sodium) in your diet. Drinking too much alcohol. Other risk factors include: Having a personal history of heart disease, diabetes, high cholesterol, or kidney disease. Stress. Having a family history of high blood pressure and high cholesterol. Having obstructive sleep apnea. Age. The risk increases with age. What are the signs or symptoms? High blood pressure may not cause symptoms. Very high blood pressure (hypertensive crisis) may cause: Headache. Fast or irregular  heartbeats (palpitations). Shortness of breath. Nosebleed. Nausea and vomiting. Vision changes. Severe chest pain, dizziness, and seizures. How is this diagnosed? This condition is diagnosed by measuring your blood pressure while you are seated, with your arm resting on a flat surface, your legs uncrossed, and your feet flat on the floor. The cuff of the blood pressure monitor will be placed directly against the skin of your upper arm at the level of your heart. Blood pressure should be measured at least twice using the same arm. Certain conditions can cause a difference in blood pressure between your right and left arms. If you have a high blood pressure reading during one visit or you have normal blood pressure with other risk factors, you may be asked to: Return on a different day to have your blood pressure checked again. Monitor your blood pressure at home for 1 week or longer. If you are diagnosed with hypertension, you may have other blood or imaging tests to help your health care provider understand your overall risk for other conditions. How is this treated? This condition is treated by making healthy lifestyle changes, such as eating healthy foods, exercising more, and reducing your alcohol intake. You may be referred for counseling on a healthy diet and physical activity. Your health care provider may prescribe medicine if lifestyle changes are not enough to get your blood pressure under control and if: Your systolic blood pressure is above 130. Your diastolic blood pressure is above 80. Your personal target blood pressure may vary depending on your medical conditions, your age, and other factors. Follow these instructions at home: Eating and drinking  Eat a diet that is high in fiber and potassium, and low  in sodium, added sugar, and fat. An example of this eating plan is called the DASH diet. DASH stands for Dietary Approaches to Stop Hypertension. To eat this way: Eat plenty of  fresh fruits and vegetables. Try to fill one half of your plate at each meal with fruits and vegetables. Eat whole grains, such as whole-wheat pasta, brown rice, or whole-grain bread. Fill about one fourth of your plate with whole grains. Eat or drink low-fat dairy products, such as skim milk or low-fat yogurt. Avoid fatty cuts of meat, processed or cured meats, and poultry with skin. Fill about one fourth of your plate with lean proteins, such as fish, chicken without skin, beans, eggs, or tofu. Avoid pre-made and processed foods. These tend to be higher in sodium, added sugar, and fat. Reduce your daily sodium intake. Many people with hypertension should eat less than 1,500 mg of sodium a day. Do not drink alcohol if: Your health care provider tells you not to drink. You are pregnant, may be pregnant, or are planning to become pregnant. If you drink alcohol: Limit how much you have to: 0-1 drink a day for women. 0-2 drinks a day for men. Know how much alcohol is in your drink. In the [place] one drink equals one 12 oz bottle of beer (355 mL), one 5 oz glass of wine (148 mL), or one 1 oz glass of hard liquor (44 mL). Lifestyle  Work with your health care provider to maintain a healthy body weight or to lose weight. Ask what an ideal weight is for you. Get at least 30 minutes of exercise that causes your heart to beat faster (aerobic exercise) most days of the week. Activities may include walking, swimming, or biking. Include exercise to strengthen your muscles (resistance exercise), such as Pilates or lifting weights, as part of your weekly exercise routine. Try to do these types of exercises for 30 minutes at least 3 days a week. Do not use any products that contain nicotine or tobacco. These products include cigarettes, chewing tobacco, and vaping devices, such as e-cigarettes. If you need help quitting, ask your health care provider. Monitor your blood pressure at home as told by your health  care provider. Keep all follow-up visits. This is important. Medicines Take over-the-counter and prescription medicines only as told by your health care provider. Follow directions carefully. Blood pressure medicines must be taken as prescribed. Do not skip doses of blood pressure medicine. Doing this puts you at risk for problems and can make the medicine less effective. Ask your health care provider about side effects or reactions to medicines that you should watch for. Contact a health care provider if you: Think you are having a reaction to a medicine you are taking. Have headaches that keep coming back (recurring). Feel dizzy. Have swelling in your ankles. Have trouble with your vision. Get help right away if you: Develop a severe headache or confusion. Have unusual weakness or numbness. Feel faint. Have severe pain in your chest or abdomen. Vomit repeatedly. Have trouble breathing. These symptoms may be an emergency. Get help right away. Call 911. Do not wait to see if the symptoms will go away. Do not drive yourself to the hospital. Summary Hypertension is when the force of blood pumping through your arteries is too strong. If this condition is not controlled, it may put you at risk for serious complications. Your personal target blood pressure may vary depending on your medical conditions, your age, and other factors. For  most people, a normal blood pressure is less than 120/80. Hypertension is treated with lifestyle changes, medicines, or a combination of both. Lifestyle changes include losing weight, eating a healthy, low-sodium diet, exercising more, and limiting alcohol. This information is not intended to replace advice given to you by your health care provider. Make sure you discuss any questions you have with your health care provider. Document Revised: 07/04/2021 Document Reviewed: 07/04/2021 Elsevier Patient Education  2024 [employer].

## 2024-03-19 NOTE — Progress Notes (Signed)
 Melody Sutton 73 y.o.   Chief Complaint  Patient presents with   Hypertension    Patient states her bp has been 200/140 this morning. She doesn't check bp regularly. No headaches, she already has lens issues so has issues with sight already.     HISTORY OF PRESENT ILLNESS: This is a 73 y.o. female complaining of elevated blood pressure readings at home History of hypertension.  Presently on Tenoretic  100-25 and losartan  100 mg daily Asymptomatic.  Hypertension Pertinent negatives include no chest pain, headaches, palpitations or shortness of breath.     Prior to Admission medications   Medication Sig Start Date End Date Taking? Authorizing Provider  Acetaminophen (TYLENOL PO) Take by mouth daily.   Yes [provider]  atenolol -chlorthalidone  (TENORETIC ) 100-25 MG tablet TAKE 1 TABLET BY MOUTH DAILY 08/29/23  Yes Johnny Garnette LABOR, MD  atorvastatin  (LIPITOR) 40 MG tablet TAKE 1 TABLET BY MOUTH DAILY 08/01/23  Yes Johnny Garnette LABOR, MD  Calcium -Magnesium-Vitamin D  (CALCIUM  1200+D3 PO) Take by mouth.   Yes [provider]  HYDROcodone  bit-homatropine (HYCODAN) 5-1.5 MG/5ML syrup Take 5 mLs by mouth every 4 (four) hours as needed for cough. 11/27/23  Yes Johnny Garnette LABOR, MD  losartan  (COZAAR ) 50 MG tablet TAKE 2 TABLETS BY MOUTH DAILY 12/12/23  Yes Johnny Garnette LABOR, MD  meloxicam  (MOBIC ) 15 MG tablet TAKE 1 TABLET BY MOUTH DAILY 05/20/23  Yes Johnny Garnette LABOR, MD  potassium chloride  SA (KLOR-CON  M) 20 MEQ tablet TAKE 1 TABLET BY MOUTH DAILY 10/03/23  Yes Johnny Garnette LABOR, MD  zolpidem  (AMBIEN ) 10 MG tablet TAKE 1 TABLET BY MOUTH AT  BEDTIME AS NEEDED FOR SLEEP 03/02/24  Yes Johnny Garnette LABOR, MD    No Known Allergies  Patient Active Problem List   Diagnosis Date Noted   Vasomotor symptoms due to menopause 11/12/2023   Vulvar lesion 11/12/2023   Well woman exam with routine gynecological exam 11/12/2023   Throat discomfort 08/10/2022   Cough 08/10/2022   COVID-19 virus infection  11/10/2021   History of cervical dysplasia 01/29/2017   Menopausal state 09/22/2012   CIN III (cervical intraepithelial neoplasia grade III) with severe dysplasia    Dysmenorrhea    Ovarian cyst    Granuloma annulare    LIPOMA 06/09/2010   LESION, FACE 06/09/2010   FOOT PAIN, BILATERAL 06/09/2010   Hyperlipemia, mixed 08/28/2007   Essential hypertension 08/28/2007   Allergic rhinitis 08/28/2007   INSOMNIA 08/28/2007   HEADACHE 08/28/2007    Past Medical History:  Diagnosis Date   Allergy    Cataract 2023   Dr. Lavonia   CIN III (cervical intraepithelial neoplasia grade III) with severe dysplasia    Dysmenorrhea    Granuloma annulare    Hyperlipidemia    Hypertension    Ovarian cyst     Past Surgical History:  Procedure Laterality Date   CATARACT EXTRACTION Bilateral 2023   COLONOSCOPY  02/17/2014   per Dr. Obie, adenomatous polyps, repeat in 7 yrs    FOOT SURGERY Left    NASAL SINUS SURGERY     30+ yeras ago   OVARIAN CYST SURGERY     POLYPECTOMY     TA+   TUBAL LIGATION     VAGINAL HYSTERECTOMY      Social History   Socioeconomic History   Marital status: Married    Spouse name: Not on file   Number of children: Not on file   Years of education: Not on file  Highest education level: Some college, no degree  Occupational History   Not on file  Tobacco Use   Smoking status: Former    Current packs/day: 0.00    Average packs/day: 1 pack/day for 32.0 years (32.0 ttl pk-yrs)    Types: Cigarettes    Start date: 06/11/1959    Quit date: 06/11/1991    Years since quitting: 32.7   Smokeless tobacco: Never  Vaping Use   Vaping status: Never Used  Substance and Sexual Activity   Alcohol use: Yes    Alcohol/week: 7.0 standard drinks of alcohol    Types: 7 Glasses of wine per week    Comment: daily ( 2)    Drug use: No   Sexual activity: Yes    Partners: Male    Birth control/protection: Surgical    Comment: 1st intercourse- 16, partners-  5, married- 34  yrs   Other Topics Concern   Not on file  Social History Narrative   Not on file   Social Drivers of Health   Financial Resource Strain: Low Risk  (02/26/2024)   Overall Financial Resource Strain (CARDIA)    Difficulty of Paying Living Expenses: Not hard at all  Food Insecurity: No Food Insecurity (02/26/2024)   Hunger Vital Sign    Worried About Running Out of Food in the Last Year: Never true    Ran Out of Food in the Last Year: Never true  Transportation Needs: No Transportation Needs (02/26/2024)   PRAPARE - Administrator, Civil Service (Medical): No    Lack of Transportation (Non-Medical): No  Physical Activity: Inactive (02/26/2024)   Exercise Vital Sign    Days of Exercise per Week: 0 days    Minutes of Exercise per Session: 0 min  Stress: No Stress Concern Present (02/26/2024)   [employer] of Occupational Health - Occupational Stress Questionnaire    Feeling of Stress: Not at all  Social Connections: Moderately Isolated (02/26/2024)   Social Connection and Isolation Panel    Frequency of Communication with Friends and Family: More than three times a week    Frequency of Social Gatherings with Friends and Family: More than three times a week    Attends Religious Services: Never    Database administrator or Organizations: No    Attends Banker Meetings: Never    Marital Status: Married  Catering manager Violence: Not At Risk (02/26/2024)   Humiliation, Afraid, Rape, and Kick questionnaire    Fear of Current or Ex-Partner: No    Emotionally Abused: No    Physically Abused: No    Sexually Abused: No    Family History  Problem Relation Age of Onset   Hypertension Mother    Breast cancer Maternal Aunt    Cancer Maternal Aunt        Vulvar Ca in situ   Diabetes Maternal Uncle    Colon cancer Neg Hx    Pancreatic cancer Neg Hx    Stomach cancer Neg Hx    Colon polyps Neg Hx    Esophageal cancer Neg Hx    Rectal cancer Neg Hx       Review of Systems  Constitutional: Negative.  Negative for chills and fever.  HENT: Negative.  Negative for congestion and sore throat.   Respiratory: Negative.  Negative for cough and shortness of breath.   Cardiovascular: Negative.  Negative for chest pain and palpitations.  Gastrointestinal:  Negative for abdominal pain, diarrhea, nausea and vomiting.  Genitourinary: Negative.  Negative for dysuria and hematuria.  Skin: Negative.  Negative for rash.  Neurological: Negative.  Negative for dizziness and headaches.  All other systems reviewed and are negative.   Today's Vitals   03/19/24 1413  BP: (!) 180/94  Pulse: 62  Temp: 98.4 F (36.9 C)  TempSrc: Oral  SpO2: 98%  Weight: 131 lb (59.4 kg)  Height: 5' 5.5 (1.664 m)   Body mass index is 21.47 kg/m.   Physical Exam Vitals reviewed.  Constitutional:      Appearance: Normal appearance.  HENT:     Head: Normocephalic.  Eyes:     Extraocular Movements: Extraocular movements intact.  Cardiovascular:     Rate and Rhythm: Normal rate and regular rhythm.     Pulses: Normal pulses.     Heart sounds: Normal heart sounds.  Pulmonary:     Effort: Pulmonary effort is normal.     Breath sounds: Normal breath sounds.  Abdominal:     Palpations: Abdomen is soft.     Tenderness: There is no abdominal tenderness.  Musculoskeletal:     Right lower leg: No edema.     Left lower leg: No edema.  Skin:    General: Skin is warm and dry.  Neurological:     Mental Status: She is alert and oriented to person, place, and time.  Psychiatric:        Mood and Affect: Mood normal.        Behavior: Behavior normal.      ASSESSMENT & PLAN: A total of 40 minutes was spent with the patient and counseling/coordination of care regarding preparing for this visit, review of most recent office visit notes, review of chronic medical conditions under management, review of all medications, diagnosis of hypertension and cardiovascular risk  associated with this condition, changes made with her antihypertensive medication, prognosis, ED precautions, documentation, and need for follow-up with PCP next week.  Problem List Items Addressed This Visit       Cardiovascular and Mediastinum   Essential hypertension - Primary   BP Readings from Last 3 Encounters:  03/19/24 (!) 180/94  02/26/24 110/62  11/27/23 138/60  Uncontrolled hypertension Clinically stable.  Asymptomatic. Cardiovascular risks associated with uncontrolled hypertension discussed Diet and nutrition discussed Recommend to continue Tenoretic  100-25 mg daily Recommend to stop losartan  and start valsartan  320 mg daily ED precautions given Advised to follow-up with PCP next week       Relevant Medications   valsartan  (DIOVAN ) 320 MG tablet   Patient Instructions  Continue Tenoretic  Stop losartan  Start valsartan  Continue monitoring blood pressure readings at home daily for the next several weeks and follow-up with your PCP within the next 2 weeks.  Hypertension, Adult High blood pressure (hypertension) is when the force of blood pumping through the arteries is too strong. The arteries are the blood vessels that carry blood from the heart throughout the body. Hypertension forces the heart to work harder to pump blood and may cause arteries to become narrow or stiff. Untreated or uncontrolled hypertension can lead to a heart attack, heart failure, a stroke, kidney disease, and other problems. A blood pressure reading consists of a higher number over a lower number. Ideally, your blood pressure should be below 120/80. The first (top) number is called the systolic pressure. It is a measure of the pressure in your arteries as your heart beats. The second (bottom) number is called the diastolic pressure. It is a measure of the pressure in your  arteries as the heart relaxes. What are the causes? The exact cause of this condition is not known. There are some conditions  that result in high blood pressure. What increases the risk? Certain factors may make you more likely to develop high blood pressure. Some of these risk factors are under your control, including: Smoking. Not getting enough exercise or physical activity. Being overweight. Having too much fat, sugar, calories, or salt (sodium) in your diet. Drinking too much alcohol. Other risk factors include: Having a personal history of heart disease, diabetes, high cholesterol, or kidney disease. Stress. Having a family history of high blood pressure and high cholesterol. Having obstructive sleep apnea. Age. The risk increases with age. What are the signs or symptoms? High blood pressure may not cause symptoms. Very high blood pressure (hypertensive crisis) may cause: Headache. Fast or irregular heartbeats (palpitations). Shortness of breath. Nosebleed. Nausea and vomiting. Vision changes. Severe chest pain, dizziness, and seizures. How is this diagnosed? This condition is diagnosed by measuring your blood pressure while you are seated, with your arm resting on a flat surface, your legs uncrossed, and your feet flat on the floor. The cuff of the blood pressure monitor will be placed directly against the skin of your upper arm at the level of your heart. Blood pressure should be measured at least twice using the same arm. Certain conditions can cause a difference in blood pressure between your right and left arms. If you have a high blood pressure reading during one visit or you have normal blood pressure with other risk factors, you may be asked to: Return on a different day to have your blood pressure checked again. Monitor your blood pressure at home for 1 week or longer. If you are diagnosed with hypertension, you may have other blood or imaging tests to help your health care provider understand your overall risk for other conditions. How is this treated? This condition is treated by making  healthy lifestyle changes, such as eating healthy foods, exercising more, and reducing your alcohol intake. You may be referred for counseling on a healthy diet and physical activity. Your health care provider may prescribe medicine if lifestyle changes are not enough to get your blood pressure under control and if: Your systolic blood pressure is above 130. Your diastolic blood pressure is above 80. Your personal target blood pressure may vary depending on your medical conditions, your age, and other factors. Follow these instructions at home: Eating and drinking  Eat a diet that is high in fiber and potassium, and low in sodium, added sugar, and fat. An example of this eating plan is called the DASH diet. DASH stands for Dietary Approaches to Stop Hypertension. To eat this way: Eat plenty of fresh fruits and vegetables. Try to fill one half of your plate at each meal with fruits and vegetables. Eat whole grains, such as whole-wheat pasta, brown rice, or whole-grain bread. Fill about one fourth of your plate with whole grains. Eat or drink low-fat dairy products, such as skim milk or low-fat yogurt. Avoid fatty cuts of meat, processed or cured meats, and poultry with skin. Fill about one fourth of your plate with lean proteins, such as fish, chicken without skin, beans, eggs, or tofu. Avoid pre-made and processed foods. These tend to be higher in sodium, added sugar, and fat. Reduce your daily sodium intake. Many people with hypertension should eat less than 1,500 mg of sodium a day. Do not drink alcohol if: Your health care provider  tells you not to drink. You are pregnant, may be pregnant, or are planning to become pregnant. If you drink alcohol: Limit how much you have to: 0-1 drink a day for women. 0-2 drinks a day for men. Know how much alcohol is in your drink. In the [place] one drink equals one 12 oz bottle of beer (355 mL), one 5 oz glass of wine (148 mL), or one 1 oz glass of hard  liquor (44 mL). Lifestyle  Work with your health care provider to maintain a healthy body weight or to lose weight. Ask what an ideal weight is for you. Get at least 30 minutes of exercise that causes your heart to beat faster (aerobic exercise) most days of the week. Activities may include walking, swimming, or biking. Include exercise to strengthen your muscles (resistance exercise), such as Pilates or lifting weights, as part of your weekly exercise routine. Try to do these types of exercises for 30 minutes at least 3 days a week. Do not use any products that contain nicotine or tobacco. These products include cigarettes, chewing tobacco, and vaping devices, such as e-cigarettes. If you need help quitting, ask your health care provider. Monitor your blood pressure at home as told by your health care provider. Keep all follow-up visits. This is important. Medicines Take over-the-counter and prescription medicines only as told by your health care provider. Follow directions carefully. Blood pressure medicines must be taken as prescribed. Do not skip doses of blood pressure medicine. Doing this puts you at risk for problems and can make the medicine less effective. Ask your health care provider about side effects or reactions to medicines that you should watch for. Contact a health care provider if you: Think you are having a reaction to a medicine you are taking. Have headaches that keep coming back (recurring). Feel dizzy. Have swelling in your ankles. Have trouble with your vision. Get help right away if you: Develop a severe headache or confusion. Have unusual weakness or numbness. Feel faint. Have severe pain in your chest or abdomen. Vomit repeatedly. Have trouble breathing. These symptoms may be an emergency. Get help right away. Call 911. Do not wait to see if the symptoms will go away. Do not drive yourself to the hospital. Summary Hypertension is when the force of blood  pumping through your arteries is too strong. If this condition is not controlled, it may put you at risk for serious complications. Your personal target blood pressure may vary depending on your medical conditions, your age, and other factors. For most people, a normal blood pressure is less than 120/80. Hypertension is treated with lifestyle changes, medicines, or a combination of both. Lifestyle changes include losing weight, eating a healthy, low-sodium diet, exercising more, and limiting alcohol. This information is not intended to replace advice given to you by your health care provider. Make sure you discuss any questions you have with your health care provider. Document Revised: 07/04/2021 Document Reviewed: 07/04/2021 Elsevier Patient Education  2024 [employer]    Emil Schaumann, MD [facility]

## 2024-03-30 ENCOUNTER — Encounter: Payer: Self-pay | Admitting: Family Medicine

## 2024-03-30 ENCOUNTER — Ambulatory Visit: Admitting: Family Medicine

## 2024-03-30 VITALS — BP 140/78 | HR 71 | Temp 98.2°F | Wt 128.0 lb

## 2024-03-30 DIAGNOSIS — I10 Essential (primary) hypertension: Secondary | ICD-10-CM | POA: Diagnosis not present

## 2024-03-30 DIAGNOSIS — R739 Hyperglycemia, unspecified: Secondary | ICD-10-CM | POA: Diagnosis not present

## 2024-03-30 DIAGNOSIS — F5101 Primary insomnia: Secondary | ICD-10-CM | POA: Diagnosis not present

## 2024-03-30 DIAGNOSIS — E782 Mixed hyperlipidemia: Secondary | ICD-10-CM

## 2024-03-30 DIAGNOSIS — E871 Hypo-osmolality and hyponatremia: Secondary | ICD-10-CM

## 2024-03-30 LAB — BASIC METABOLIC PANEL WITH GFR
BUN: 20 mg/dL (ref 6–23)
CO2: 29 meq/L (ref 19–32)
Calcium: 9.8 mg/dL (ref 8.4–10.5)
Chloride: 93 meq/L — ABNORMAL LOW (ref 96–112)
Creatinine, Ser: 0.64 mg/dL (ref 0.40–1.20)
GFR: 87.4 mL/min (ref >60.00)
Glucose, Bld: 83 mg/dL (ref 70–99)
Potassium: 3.7 meq/L (ref 3.5–5.1)
Sodium: 132 meq/L — ABNORMAL LOW (ref 135–145)

## 2024-03-30 LAB — CBC WITH DIFFERENTIAL/PLATELET
Basophils Absolute: 0.1 K/uL (ref 0.0–0.1)
Basophils Relative: 0.9 % (ref 0.0–3.0)
Eosinophils Absolute: 0.4 K/uL (ref 0.0–0.7)
Eosinophils Relative: 4.4 % (ref 0.0–5.0)
HCT: 39.8 % (ref 36.0–46.0)
Hemoglobin: 13.5 g/dL (ref 12.0–15.0)
Lymphocytes Relative: 17.6 % (ref 12.0–46.0)
Lymphs Abs: 1.4 K/uL (ref 0.7–4.0)
MCHC: 33.9 g/dL (ref 30.0–36.0)
MCV: 90.3 fl (ref 78.0–100.0)
Monocytes Absolute: 0.8 K/uL (ref 0.1–1.0)
Monocytes Relative: 9.9 % (ref 3.0–12.0)
Neutro Abs: 5.3 K/uL (ref 1.4–7.7)
Neutrophils Relative %: 67.2 % (ref 43.0–77.0)
Platelets: 342 K/uL (ref 150.0–400.0)
RBC: 4.4 Mil/uL (ref 3.87–5.11)
RDW: 12.8 % (ref 11.5–15.5)
WBC: 7.9 K/uL (ref 4.0–10.5)

## 2024-03-30 LAB — LIPID PANEL
Cholesterol: 197 mg/dL (ref 0–200)
HDL: 91.9 mg/dL (ref >39.00)
LDL Cholesterol: 93 mg/dL (ref 0–99)
NonHDL: 105.38
Total CHOL/HDL Ratio: 2
Triglycerides: 64 mg/dL (ref 0.0–149.0)
VLDL: 12.8 mg/dL (ref 0.0–40.0)

## 2024-03-30 LAB — HEMOGLOBIN A1C: Hgb A1c MFr Bld: 5.8 % (ref 4.6–6.5)

## 2024-03-30 LAB — HEPATIC FUNCTION PANEL
ALT: 16 U/L (ref 0–35)
AST: 21 U/L (ref 0–37)
Albumin: 4.7 g/dL (ref 3.5–5.2)
Alkaline Phosphatase: 49 U/L (ref 39–117)
Bilirubin, Direct: 0.1 mg/dL (ref 0.0–0.3)
Total Bilirubin: 0.6 mg/dL (ref 0.2–1.2)
Total Protein: 7.6 g/dL (ref 6.0–8.3)

## 2024-03-30 LAB — TSH: TSH: 1.09 u[IU]/mL (ref 0.35–5.50)

## 2024-03-30 MED ORDER — VALSARTAN 320 MG PO TABS: 320.0000 mg | ORAL_TABLET | Freq: Every day | ORAL | 3 refills | Status: AC

## 2024-03-30 MED ORDER — ATENOLOL-CHLORTHALIDONE 100-25 MG PO TABS: 1.0000 | ORAL_TABLET | Freq: Every day | ORAL | 3 refills | Status: DC

## 2024-03-30 NOTE — Progress Notes (Signed)
 Subjective:    Patient ID: Melody Sutton, female    DOB: 1951/02/15, 73 y.o.   MRN: [id number]  HPI Here to follow up on issues. She was seen on 03-19-24 and her BP had shot up to 180/94. She had not been checking this regularly at home. She felt fine that day, no headache or chest pain or SOB. The Losartan  was stopped and changed to Valsartan . Since then the BP has been stable and she feels fine. She sleeps well with Zolpidem .    Review of Systems  Constitutional: Negative.   HENT: Negative.    Eyes: Negative.   Respiratory: Negative.    Cardiovascular: Negative.   Gastrointestinal: Negative.   Genitourinary:  Negative for decreased urine volume, difficulty urinating, dyspareunia, dysuria, enuresis, flank pain, frequency, hematuria, pelvic pain and urgency.  Musculoskeletal: Negative.   Skin: Negative.   Neurological: Negative.  Negative for headaches.  Psychiatric/Behavioral: Negative.         Objective:   Physical Exam Constitutional:      General: She is not in acute distress.    Appearance: Normal appearance. She is well-developed.  HENT:     Head: Normocephalic and atraumatic.     Right Ear: External ear normal.     Left Ear: External ear normal.     Nose: Nose normal.     Mouth/Throat:     Pharynx: No oropharyngeal exudate.  Eyes:     General: No scleral icterus.    Conjunctiva/sclera: Conjunctivae normal.     Pupils: Pupils are equal, round, and reactive to light.  Neck:     Thyroid : No thyromegaly.     Vascular: No JVD.  Cardiovascular:     Rate and Rhythm: Normal rate and regular rhythm.     Pulses: Normal pulses.     Heart sounds: Normal heart sounds. No murmur heard.    No friction rub. No gallop.  Pulmonary:     Effort: Pulmonary effort is normal. No respiratory distress.     Breath sounds: Normal breath sounds. No wheezing or rales.  Chest:     Chest wall: No tenderness.  Abdominal:     General: Bowel sounds are normal. There is no distension.      Palpations: Abdomen is soft. There is no mass.     Tenderness: There is no abdominal tenderness. There is no guarding or rebound.  Musculoskeletal:        General: No tenderness. Normal range of motion.     Cervical back: Normal range of motion and neck supple.  Lymphadenopathy:     Cervical: No cervical adenopathy.  Skin:    General: Skin is warm and dry.     Findings: No erythema or rash.  Neurological:     General: No focal deficit present.     Mental Status: She is alert and oriented to person, place, and time.     Cranial Nerves: No cranial nerve deficit.     Motor: No abnormal muscle tone.     Coordination: Coordination normal.     Deep Tendon Reflexes: Reflexes are normal and symmetric. Reflexes normal.  Psychiatric:        Mood and Affect: Mood normal.        Behavior: Behavior normal.        Thought Content: Thought content normal.        Judgment: Judgment normal.           Assessment & Plan:  Her HTN is now well  controlled. Her insomnia is stable. We will get labs to check lipids, etc. Medications were refilled. We spent a total of (32   ) minutes reviewing records and discussing these issues.  Garnette Olmsted, MD

## 2024-03-31 ENCOUNTER — Ambulatory Visit: Payer: Self-pay | Admitting: Family Medicine

## 2024-03-31 DIAGNOSIS — E871 Hypo-osmolality and hyponatremia: Secondary | ICD-10-CM | POA: Insufficient documentation

## 2024-03-31 NOTE — Addendum Note (Signed)
 Addended by: JOHNNY SENIOR A on: 03/31/2024 12:47 PM   Modules accepted: Orders

## 2024-04-02 ENCOUNTER — Other Ambulatory Visit: Payer: Self-pay | Admitting: Family Medicine

## 2024-04-02 DIAGNOSIS — E871 Hypo-osmolality and hyponatremia: Secondary | ICD-10-CM

## 2024-04-24 ENCOUNTER — Telehealth: Payer: Self-pay | Admitting: Family Medicine

## 2024-04-24 NOTE — Telephone Encounter (Signed)
 Copied from CRM #[id number]. Topic: General - Call Back - No Documentation >> Apr 24, 2024  9:00 AM [place] E wrote: Reason for CRM: Patient stated she missed a call from the office yesterday, agent could not locate what this was in regards too. Callback number [phone].

## 2024-04-24 NOTE — Telephone Encounter (Signed)
 I have not attempted to contact patient as of today

## 2024-04-28 ENCOUNTER — Other Ambulatory Visit (INDEPENDENT_AMBULATORY_CARE_PROVIDER_SITE_OTHER)

## 2024-04-28 DIAGNOSIS — E871 Hypo-osmolality and hyponatremia: Secondary | ICD-10-CM

## 2024-04-28 LAB — BASIC METABOLIC PANEL WITH GFR
BUN: 12 mg/dL (ref 6–23)
CO2: 31 meq/L (ref 19–32)
Calcium: 10.4 mg/dL (ref 8.4–10.5)
Chloride: 91 meq/L — ABNORMAL LOW (ref 96–112)
Creatinine, Ser: 0.69 mg/dL (ref 0.40–1.20)
GFR: 85.78 mL/min (ref >60.00)
Glucose, Bld: 119 mg/dL — ABNORMAL HIGH (ref 70–99)
Potassium: 3.6 meq/L (ref 3.5–5.1)
Sodium: 132 meq/L — ABNORMAL LOW (ref 135–145)

## 2024-05-04 ENCOUNTER — Ambulatory Visit: Admitting: Family Medicine

## 2024-05-04 ENCOUNTER — Encounter: Payer: Self-pay | Admitting: Family Medicine

## 2024-05-04 VITALS — BP 110/62 | HR 61 | Temp 98.1°F | Wt 123.4 lb

## 2024-05-04 DIAGNOSIS — I10 Essential (primary) hypertension: Secondary | ICD-10-CM | POA: Diagnosis not present

## 2024-05-04 DIAGNOSIS — E871 Hypo-osmolality and hyponatremia: Secondary | ICD-10-CM

## 2024-05-04 DIAGNOSIS — G4730 Sleep apnea, unspecified: Secondary | ICD-10-CM

## 2024-05-04 DIAGNOSIS — F411 Generalized anxiety disorder: Secondary | ICD-10-CM | POA: Diagnosis not present

## 2024-05-04 MED ORDER — ATENOLOL 100 MG PO TABS: 100.0000 mg | ORAL_TABLET | Freq: Every day | ORAL | 3 refills | Status: DC

## 2024-05-04 MED ORDER — ESCITALOPRAM OXALATE 10 MG PO TABS: 10.0000 mg | ORAL_TABLET | Freq: Every day | ORAL | 2 refills | Status: DC

## 2024-05-04 NOTE — Progress Notes (Signed)
   Subjective:    Patient ID: Melody Sutton, female    DOB: 23-May-1951, 73 y.o.   MRN: [id number]  HPI Here for several issues. First we have been following her BP. She had been getting readings of 140's over 90's at home, but these have settled down lately. We took labs when she was here on 03-30-24 and her sodium had dropped to 132. The other labs were unremarkable. We asked her to reduce her fluid intake, and she has done so. However follow up labs on 04-28-24 showed the sodium to still be low at 132. Her renal function and potassium have been fine. She describes feeling a lot of stress the past few months, and as she talked about this she became tearful. She has had a number of medical problems this year, including a issue with her left retinas. She has had a surgery on this , and another one is pending. She worries about this, and she worries about her husband who has a number of medical problems as well. She notes that when she feels stressed, her BP and pulse rate go up. Finally she asks about having a sleep study. Her husband says she snores loudly and she often wakes up gasping for breath.    Review of Systems  Constitutional: Negative.   Eyes:  Positive for visual disturbance.  Respiratory: Negative.    Cardiovascular: Negative.   Neurological: Negative.   Psychiatric/Behavioral:  Positive for sleep disturbance. Negative for agitation, behavioral problems, confusion, dysphoric mood and hallucinations. The patient is nervous/anxious.        Objective:   Physical Exam Constitutional:      Appearance: Normal appearance.  Cardiovascular:     Rate and Rhythm: Normal rate and regular rhythm.     Pulses: Normal pulses.     Heart sounds: Normal heart sounds.  Pulmonary:     Effort: Pulmonary effort is normal.     Breath sounds: Normal breath sounds.  Neurological:     Mental Status: She is alert and oriented to person, place, and time.  Psychiatric:        Behavior: Behavior normal.         Thought Content: Thought content normal.     Comments: Anxious and tearful            Assessment & Plan:  Her HTN has been well controlled most of the time, and when it does get out of control this is usually due to her stress levels. She will try taking Lexapro  10 mg daily for this. Her hyponatremia may be exacerbated by her diuretic, so we will stop Atenol-Chlorthalidone  and she will take just Atenolol  100 mg daily. We will refer her to Pulmonary to evaluate likely sleep apnea. She will return here in one month to recheck HTN, to check another sodium level, and to follow up on anxiety. Garnette Olmsted, MD

## 2024-05-13 ENCOUNTER — Encounter: Payer: Self-pay | Admitting: Sleep Medicine

## 2024-05-13 ENCOUNTER — Ambulatory Visit (INDEPENDENT_AMBULATORY_CARE_PROVIDER_SITE_OTHER): Admitting: Sleep Medicine

## 2024-05-13 VITALS — BP 180/100 | HR 57 | Temp 97.6°F | Ht 65.5 in | Wt 126.8 lb

## 2024-05-13 DIAGNOSIS — I10 Essential (primary) hypertension: Secondary | ICD-10-CM | POA: Diagnosis not present

## 2024-05-13 DIAGNOSIS — F411 Generalized anxiety disorder: Secondary | ICD-10-CM

## 2024-05-13 DIAGNOSIS — G4700 Insomnia, unspecified: Secondary | ICD-10-CM | POA: Diagnosis not present

## 2024-05-13 DIAGNOSIS — G4733 Obstructive sleep apnea (adult) (pediatric): Secondary | ICD-10-CM

## 2024-05-13 DIAGNOSIS — F5104 Psychophysiologic insomnia: Secondary | ICD-10-CM

## 2024-05-13 NOTE — Progress Notes (Signed)
 Name:Melody Sutton MRN: [id number] DOB: 1950-11-18   CHIEF COMPLAINT:  EXCESSIVE DAYTIME SLEEPINESS   HISTORY OF PRESENT ILLNESS: [name] is a 73 y.o. w/ a h/o HTN, anxiety, depression and insomnia who presents for c/o loud snoring and excessive daytime sleepiness which has been present for several years. Reports nocturnal awakenings due to nocturia, however does not have difficulty falling back to sleep. Reports a 20 lb weight gain over the last few years. Admits to dry mouth. Denies morning headaches, RLS symptoms, dream enactment, cataplexy, hypnagogic or hypnapompic hallucinations. Reports a family history of sleep apnea. Denies drowsy driving. Drinks 2 cups of coffee daily, drinks 2 glasses of wine nightly, former smoker, smokes marijuana almost daily.   Bedtime 10:30 pm Sleep onset 15 mins with Ambien  Rise time 7-7:30 am   EPWORTH SLEEP SCORE 17    05/13/2024    2:23 PM  Results of the Epworth flowsheet  Sitting and reading 3  Watching TV 3  Sitting, inactive in a public place (e.g. a theatre or a meeting) 3  As a passenger in a car for an hour without a break 1  Lying down to rest in the afternoon when circumstances permit 3  Sitting and talking to someone 1  Sitting quietly after a lunch without alcohol 3  In a car, while stopped for a few minutes in traffic 0  Total score 17     PAST MEDICAL HISTORY :   has a past medical history of Allergy, Cataract (2023), CIN III (cervical intraepithelial neoplasia grade III) with severe dysplasia, Dysmenorrhea, Granuloma annulare, Hyperlipidemia, Hypertension, and Ovarian cyst.  has a past surgical history that includes Vaginal hysterectomy; Tubal ligation; Ovarian cyst surgery; Colonoscopy (05/29/2023); Polypectomy; Nasal sinus surgery; Foot surgery (Left); and Cataract extraction (Bilateral, 2023). Prior to Admission medications   Medication Sig Start Date End Date Taking? Authorizing Provider  Acetaminophen (TYLENOL  PO) Take by mouth daily.    [provider]  atenolol  (TENORMIN ) 100 MG tablet Take 1 tablet (100 mg total) by mouth daily. 05/04/24   Johnny Garnette LABOR, MD  atorvastatin  (LIPITOR) 40 MG tablet TAKE 1 TABLET BY MOUTH DAILY 08/01/23   Johnny Garnette LABOR, MD  Calcium -Magnesium-Vitamin D  (CALCIUM  1200+D3 PO) Take by mouth.    [provider]  escitalopram  (LEXAPRO ) 10 MG tablet Take 1 tablet (10 mg total) by mouth daily. 05/04/24   Johnny Garnette LABOR, MD  meloxicam  (MOBIC ) 15 MG tablet TAKE 1 TABLET BY MOUTH DAILY 04/03/24   Johnny Garnette LABOR, MD  potassium chloride  SA (KLOR-CON  M) 20 MEQ tablet TAKE 1 TABLET BY MOUTH DAILY 10/03/23   Johnny Garnette LABOR, MD  valsartan  (DIOVAN ) 320 MG tablet Take 1 tablet (320 mg total) by mouth daily. 03/30/24   Johnny Garnette LABOR, MD  zolpidem  (AMBIEN ) 10 MG tablet TAKE 1 TABLET BY MOUTH AT  BEDTIME AS NEEDED FOR SLEEP 03/02/24   Johnny Garnette LABOR, MD   No Known Allergies  FAMILY HISTORY:  family history includes Breast cancer in her maternal aunt; Cancer in her maternal aunt; Diabetes in her maternal uncle; Hypertension in her mother. SOCIAL HISTORY:  reports that she quit smoking about 32 years ago. Her smoking use included cigarettes. She started smoking about 64 years ago. She has a 32 pack-year smoking history. She has never used smokeless tobacco. She reports current alcohol use of about 7.0 standard drinks of alcohol per week. She reports current drug use. Drug: Marijuana.   Review  of Systems:  Gen:  Denies  fever, sweats, chills weight loss  HEENT: Denies blurred vision, double vision, ear pain, eye pain, hearing loss, nose bleeds, sore throat Cardiac:  No dizziness, chest pain or heaviness, chest tightness,edema, No JVD Resp:   No cough, -sputum production, -shortness of breath,-wheezing, -hemoptysis,  Gi: Denies swallowing difficulty, stomach pain, nausea or vomiting, diarrhea, constipation, bowel incontinence Gu:  Denies bladder incontinence, burning urine Ext:    Denies Joint pain, stiffness or swelling Skin: Denies  skin rash, easy bruising or bleeding or hives Endoc:  Denies polyuria, polydipsia , polyphagia or weight change Psych:   Denies depression, insomnia or hallucinations  Other:  All other systems negative  VITAL SIGNS: BP (!) 180/100   Pulse (!) 57   Temp 97.6 F (36.4 C) (Temporal)   Ht 5' 5.5 (1.664 m)   Wt 126 lb 12.8 oz (57.5 kg)   LMP 09/10/1978   SpO2 99%   BMI 20.78 kg/m    Physical Examination:   General Appearance: No distress  EYES PERRLA, EOM intact.   NECK Supple, No JVD Pulmonary: normal breath sounds, No wheezing.  CardiovascularNormal S1,S2.  No m/r/g.   Abdomen: Benign, Soft, non-tender. Skin:   warm, no rashes, no ecchymosis  Extremities: normal, no cyanosis, clubbing. Neuro:without focal findings,  speech normal  PSYCHIATRIC: Mood, affect within normal limits.   ASSESSMENT AND PLAN  OSA I suspect that OSA is likely present due to clinical presentation. Discussed the consequences of untreated sleep apnea. Advised not to drive drowsy for safety of patient and others. Will complete further evaluation with a home sleep study and follow up to review results.    HTN Stable, on current management. Following with PCP.   Anxiety Stable, on current management.   Insomnia Counseled patient on stimulus control and improving sleep hygiene practices.    MEDICATION ADJUSTMENTS/LABS AND TESTS ORDERED: Recommend Sleep Study   Patient  satisfied with Plan of action and management. All questions answered  Follow up to review HST results and treatment plan.   I spent a total of 58 minutes reviewing chart data, face-to-face evaluation with the patient, counseling and coordination of care as detailed above.    Phebe Dettmer, M.D.  Sleep Medicine [facility]

## 2024-05-13 NOTE — Patient Instructions (Signed)
 Melody Sutton

## 2024-05-26 ENCOUNTER — Other Ambulatory Visit: Payer: Self-pay | Admitting: Family Medicine

## 2024-06-03 ENCOUNTER — Encounter

## 2024-06-03 DIAGNOSIS — G4733 Obstructive sleep apnea (adult) (pediatric): Secondary | ICD-10-CM

## 2024-06-06 ENCOUNTER — Emergency Department (HOSPITAL_BASED_OUTPATIENT_CLINIC_OR_DEPARTMENT_OTHER)
Admission: EM | Admit: 2024-06-06 | Discharge: 2024-06-06 | Disposition: A | Discharge status: Home / Self Care | Attending: Emergency Medicine | Admitting: Emergency Medicine

## 2024-06-06 ENCOUNTER — Encounter (HOSPITAL_BASED_OUTPATIENT_CLINIC_OR_DEPARTMENT_OTHER): Payer: Self-pay | Admitting: Emergency Medicine

## 2024-06-06 ENCOUNTER — Other Ambulatory Visit: Payer: Self-pay

## 2024-06-06 ENCOUNTER — Emergency Department (HOSPITAL_BASED_OUTPATIENT_CLINIC_OR_DEPARTMENT_OTHER)

## 2024-06-06 DIAGNOSIS — I10 Essential (primary) hypertension: Secondary | ICD-10-CM | POA: Diagnosis not present

## 2024-06-06 DIAGNOSIS — D72829 Elevated white blood cell count, unspecified: Secondary | ICD-10-CM | POA: Insufficient documentation

## 2024-06-06 DIAGNOSIS — Z79899 Other long term (current) drug therapy: Secondary | ICD-10-CM | POA: Diagnosis not present

## 2024-06-06 DIAGNOSIS — R519 Headache, unspecified: Secondary | ICD-10-CM

## 2024-06-06 LAB — CBC WITH DIFFERENTIAL/PLATELET
Abs Immature Granulocytes: 0.02 K/uL (ref 0.00–0.07)
Basophils Absolute: 0.1 K/uL (ref 0.0–0.1)
Basophils Relative: 1 %
Eosinophils Absolute: 0.3 K/uL (ref 0.0–0.5)
Eosinophils Relative: 3 %
HCT: 40.2 % (ref 36.0–46.0)
Hemoglobin: 13.8 g/dL (ref 12.0–15.0)
Immature Granulocytes: 0 %
Lymphocytes Relative: 21 %
Lymphs Abs: 2.3 K/uL (ref 0.7–4.0)
MCH: 30.4 pg (ref 26.0–34.0)
MCHC: 34.3 g/dL (ref 30.0–36.0)
MCV: 88.5 fL (ref 80.0–100.0)
Monocytes Absolute: 0.9 K/uL (ref 0.1–1.0)
Monocytes Relative: 8 %
Neutro Abs: 7.2 K/uL (ref 1.7–7.7)
Neutrophils Relative %: 67 %
Platelets: 297 K/uL (ref 150–400)
RBC: 4.54 MIL/uL (ref 3.87–5.11)
RDW: 13.1 % (ref 11.5–15.5)
WBC: 10.7 K/uL — ABNORMAL HIGH (ref 4.0–10.5)
nRBC: 0 % (ref 0.0–0.2)

## 2024-06-06 LAB — T4, FREE: Free T4: 0.72 ng/dL (ref 0.61–1.12)

## 2024-06-06 LAB — BASIC METABOLIC PANEL WITH GFR
Anion gap: 14 (ref 5–15)
BUN: 12 mg/dL (ref 8–23)
CO2: 24 mmol/L (ref 22–32)
Calcium: 10.3 mg/dL (ref 8.9–10.3)
Chloride: 100 mmol/L (ref 98–111)
Creatinine, Ser: 0.61 mg/dL (ref 0.44–1.00)
GFR, Estimated: >60 mL/min (ref >60)
Glucose, Bld: 95 mg/dL (ref 70–99)
Potassium: 4 mmol/L (ref 3.5–5.1)
Sodium: 138 mmol/L (ref 135–145)

## 2024-06-06 LAB — TSH: TSH: 2.13 u[IU]/mL (ref 0.350–4.500)

## 2024-06-06 MED ORDER — AMLODIPINE BESYLATE 5 MG PO TABS: 10.0000 mg | ORAL_TABLET | Freq: Every day | ORAL | 2 refills | Status: DC

## 2024-06-06 MED ORDER — LORAZEPAM 1 MG PO TABS
2.0000 mg | ORAL_TABLET | Freq: Once | ORAL | Status: AC
  Administered 2024-06-06: 2 mg via ORAL
  Filled 2024-06-06: qty 2

## 2024-06-06 MED ORDER — AMLODIPINE BESYLATE 5 MG PO TABS
10.0000 mg | ORAL_TABLET | Freq: Once | ORAL | Status: AC
  Administered 2024-06-06: 10 mg via ORAL
  Filled 2024-06-06: qty 2

## 2024-06-06 NOTE — ED Provider Notes (Signed)
 Melody Sutton EMERGENCY DEPARTMENT AT [facility] Provider Note   CSN: [id number] Arrival date & time: 06/06/24  1551     Patient presents with: Hypertension   Melody Sutton is a 73 y.o. female.   73 year old female with a history of hypertension who presents to the emergency department with elevated blood pressure and headache.  For the month of September patient has noted that her blood pressures have been in the 170s to 200s at home.  Says that it often corresponds with anxiety.  Recently was found to be hyponatremic so was taken off her chlorthalidone  and is now only on atenolol  100 mg daily and valsartan  320 mg daily.  Said that her blood pressure was in the mid 200s systolic today and decided to come into the emergency department.  Says that she has had a dull pressure in the front of her head for several weeks as well.  Gradual onset.  No weakness or numbness of her arms or legs.  No facial droop or difficulty speaking.  No chest pain or shortness of breath.       Prior to Admission medications   Medication Sig Start Date End Date Taking? Authorizing Provider  amLODipine (NORVASC) 5 MG tablet Take 2 tablets (10 mg total) by mouth daily. 06/06/24  Yes Melody Lamar BROCKS, MD  Acetaminophen (TYLENOL PO) Take by mouth daily.    [provider]  atenolol  (TENORMIN ) 100 MG tablet Take 1 tablet (100 mg total) by mouth daily. 05/04/24   Johnny Garnette LABOR, MD  atorvastatin  (LIPITOR) 40 MG tablet TAKE 1 TABLET BY MOUTH DAILY 08/01/23   Johnny Garnette LABOR, MD  Calcium -Magnesium-Vitamin D  (CALCIUM  1200+D3 PO) Take by mouth.    [provider]  escitalopram  (LEXAPRO ) 10 MG tablet TAKE 1 TABLET BY MOUTH EVERY DAY 05/26/24   Johnny Garnette LABOR, MD  meloxicam  (MOBIC ) 15 MG tablet TAKE 1 TABLET BY MOUTH DAILY 04/03/24   Johnny Garnette LABOR, MD  potassium chloride  SA (KLOR-CON  M) 20 MEQ tablet TAKE 1 TABLET BY MOUTH DAILY 10/03/23   Johnny Garnette LABOR, MD  prednisoLONE acetate (PRED FORTE) 1 %  ophthalmic suspension Place 1 drop into the left eye 3 (three) times daily. 04/21/24   [provider]  valsartan  (DIOVAN ) 320 MG tablet Take 1 tablet (320 mg total) by mouth daily. 03/30/24   Johnny Garnette LABOR, MD  zolpidem  (AMBIEN ) 10 MG tablet TAKE 1 TABLET BY MOUTH AT  BEDTIME AS NEEDED FOR SLEEP 03/02/24   Johnny Garnette LABOR, MD    Allergies: Patient has no known allergies.    Review of Systems  Updated Vital Signs BP (!) 219/96   Pulse 62   Temp 98.3 F (36.8 C) (Oral)   Resp 20   Ht 5' 5.5 (1.664 m)   Wt 59 kg   LMP 09/10/1978   SpO2 100%   BMI 21.30 kg/m   Physical Exam Vitals and nursing note reviewed.  Constitutional:      General: She is not in acute distress.    Appearance: She is well-developed.  HENT:     Head: Normocephalic and atraumatic.     Right Ear: External ear normal.     Left Ear: External ear normal.     Nose: Nose normal.  Eyes:     Extraocular Movements: Extraocular movements intact.     Conjunctiva/sclera: Conjunctivae normal.     Pupils: Pupils are equal, round, and reactive to light.  Cardiovascular:     Rate and  Rhythm: Normal rate and regular rhythm.     Heart sounds: No murmur heard. Pulmonary:     Effort: Pulmonary effort is normal. No respiratory distress.     Breath sounds: Normal breath sounds.  Musculoskeletal:     Cervical back: Normal range of motion and neck supple.     Right lower leg: No edema.     Left lower leg: No edema.  Skin:    General: Skin is warm and dry.  Neurological:     Mental Status: She is alert and oriented to person, place, and time. Mental status is at baseline.     Comments: NIHSS Exam  Level of Consciousness: Alert  LOC Questions: Answers Month and Age Correctly  LOC Commands: Opens and Closes Eyes and Hands on command  Best Gaze: Horizontal ocular movements intact  Visual Fields: No visual field loss  Facial Palsy: None  L Upper Extremity Motor: No drift after 10 seconds  R Upper Extremity Motor:  No drift after 10 seconds  L Lower extremity Motor: No drift after 5 seconds  R Lower extremity Motor: No drift after 5 seconds  Ataxia: Absent  Sensory: Intact sensation to light touch on face, arms, trunk, and legs bilaterally  Best Language: No aphasia  Dysarthria: No dysarthria  Neglect: No visual or sensory neglect  Able to walk without assistance with normal gait  Psychiatric:        Mood and Affect: Mood normal.     (all labs ordered are listed, but only abnormal results are displayed) Labs Reviewed  CBC WITH DIFFERENTIAL/PLATELET - Abnormal; Notable for the following components:      Result Value   WBC 10.7 (*)    All other components within normal limits  TSH  BASIC METABOLIC PANEL WITH GFR  T4, FREE    EKG: EKG Interpretation Date/Time:  Saturday June 06 2024 16:20:28 EDT Ventricular Rate:  65 PR Interval:  163 QRS Duration:  81 QT Interval:  462 QTC Calculation: 481 R Axis:   29  Text Interpretation: Sinus rhythm Consider left ventricular hypertrophy Confirmed by Melody Charleston [phone]) on 06/06/2024 6:02:06 PM  Radiology: CT Head Wo Contrast Result Date: 06/06/2024 EXAM: CT HEAD WITHOUT CONTRAST 06/06/2024 05:07:59 PM TECHNIQUE: CT of the head was performed without the administration of intravenous contrast. Automated exposure control, iterative reconstruction, and/or weight based adjustment of the mA/kV was utilized to reduce the radiation dose to as low as reasonably achievable. COMPARISON: None available. CLINICAL HISTORY: headache. FINDINGS: BRAIN AND VENTRICLES: Confluent periventricular and subcortical white matter hypoattenuation is moderately advanced for age. Atherosclerotic calcifications are present in the cavernous carotid arteries bilaterally. No acute hemorrhage. No evidence of acute infarct. No hydrocephalus. No extra-axial collection. No mass effect or midline shift. No hyperdense vessel is present. ORBITS: Bilateral lens replacements are noted.  The globes and orbits are otherwise within normal limits. SINUSES: No acute abnormality. SOFT TISSUES AND SKULL: No acute soft tissue abnormality. No skull fracture. IMPRESSION: 1. No acute intracranial abnormality. 2. Moderately advanced confluent periventricular and subcortical white matter hypoattenuation for age. This most likely reflects the sequelae of chronic microvascular ischemia. Electronically signed by: Lonni Necessary MD 06/06/2024 05:17 PM EDT RP Workstation: [id number]     Procedures   Medications Ordered in the ED  LORazepam (ATIVAN) tablet 2 mg (2 mg Oral Given 06/06/24 1657)  amLODipine (NORVASC) tablet 10 mg (10 mg Oral Given 06/06/24 1756)  Medical Decision Making Amount and/or Complexity of Data Reviewed Labs: ordered. Radiology: ordered.  Risk Prescription drug management.   73 year old female with history of hypertension who presents emergency department for elevated blood pressure and headache  Initial Ddx:  Hypertensive emergency, ICH, stroke, MI, CHF, anxiety, hyperthyroidism  MDM/Course:  Patient presents emergency department elevated blood pressure.  Reports that she has been somewhat anxious as well recently and thinks that it could potentially be related.  Has been going on for approximately a month.  Does have a mild headache that has been present for several weeks as well.  No concern for thunderclap headache.  On arrival is hypertensive to 251/119.  She has a nonfocal neurologic exam.  No meningismus.  No other signs of hypertensive emergency at this point in time.  Had a head CT that did not show acute abnormality.  CBC and BMP unremarkable as well.  Did send thyroid  studies with her anxiety and TSH was normal.  Free T4 is pending.  Given Ativan for anxiety upon re-evaluation was feeling [place] but was still markedly hypertensive to the 230s.  Was given 10 mg of amlodipine and did have some improvement of her blood  pressure to 219/96.  Performed shared decision making with the patient and did offer admission for blood pressure control especially given the degree of her hypertension at this point in time.  She stated that she preferred to go home and give the amlodipine some time to work over the next few days and follow-up with her primary doctor.  Strict return precautions discussed prior to discharge.  This patient presents to the ED for concern of complaints listed in HPI, this involves an extensive number of treatment options, and is a complaint that carries with it a high risk of complications and morbidity. Disposition including potential need for admission considered.   Dispo: DC Home. Return precautions discussed including, but not limited to, those listed in the AVS. Allowed pt time to ask questions which were answered fully prior to dc.  Additional history obtained from spouse Records reviewed Outpatient Clinic Notes The following labs were independently interpreted: Chemistry and show no acute abnormality I independently reviewed the following imaging with scope of interpretation limited to determining acute life threatening conditions related to emergency care: CT Head and agree with the radiologist interpretation with the following exceptions: none I personally reviewed and interpreted cardiac monitoring: normal sinus rhythm  I personally reviewed and interpreted the pt's EKG: see above for interpretation  I have reviewed the patients home medications and made adjustments as needed Social Determinants of health:  Geriatric  Portions of this note were generated with Scientist, clinical (histocompatibility and immunogenetics). Dictation errors may occur despite best attempts at proofreading.     Final diagnoses:  Uncontrolled hypertension  Nonintractable headache, unspecified chronicity pattern, unspecified headache type    ED Discharge Orders          Ordered    amLODipine (NORVASC) 5 MG tablet  Daily        06/06/24  1823               Melody Lamar BROCKS, MD 06/06/24 1927

## 2024-06-06 NOTE — ED Notes (Signed)
 Notified MD of pt BP 251/119

## 2024-06-06 NOTE — ED Triage Notes (Signed)
 Pt c/o elevated BP with max reading at home 226/110 with recent readings consistently over 200 systolic. Pt has 2 rx for antihypertensives and takes both daily as prescribed. No visual changes, dizziness. Pt reports mild head pressure.

## 2024-06-06 NOTE — ED Notes (Signed)
 Patient discharged home via private vehicle. NADN. Discharge instructions reviewed with patient and significant other. All questions answered. No other concerns at this time.

## 2024-06-06 NOTE — Discharge Instructions (Signed)
 You were seen for your high blood pressure (hypertension) in the emergency department.   At home, please take the medications (amlodipine, atenolol , and valsartan ) that you were prescribed for your blood pressure.    Check your MyChart online for the results of any tests that had not resulted by the time you left the emergency department.   Follow-up with your primary doctor in 2-3 days regarding your visit.  Please talk to them about your blood pressure recordings at home.  Return immediately to the emergency department if you experience any of the following: Severe headache, vision changes, numbness or weakness of your arms or legs, difficulty breathing, chest pain, or any other concerning symptoms.    Thank you for visiting our Emergency Department. It was a pleasure taking care of you today.

## 2024-06-09 ENCOUNTER — Encounter: Payer: Self-pay | Admitting: Family Medicine

## 2024-06-09 ENCOUNTER — Ambulatory Visit: Admitting: Family Medicine

## 2024-06-09 VITALS — BP 152/80 | HR 71 | Temp 98.3°F | Wt 121.6 lb

## 2024-06-09 DIAGNOSIS — I10 Essential (primary) hypertension: Secondary | ICD-10-CM

## 2024-06-09 DIAGNOSIS — F419 Anxiety disorder, unspecified: Secondary | ICD-10-CM | POA: Diagnosis not present

## 2024-06-09 MED ORDER — CARVEDILOL 12.5 MG PO TABS: 12.5000 mg | ORAL_TABLET | Freq: Two times a day (BID) | ORAL | 3 refills | Status: DC

## 2024-06-09 MED ORDER — AMLODIPINE BESYLATE 10 MG PO TABS: 10.0000 mg | ORAL_TABLET | Freq: Every day | ORAL | 3 refills | Status: DC

## 2024-06-09 MED ORDER — ESCITALOPRAM OXALATE 10 MG PO TABS: 20.0000 mg | ORAL_TABLET | Freq: Every day | ORAL | Status: DC

## 2024-06-09 NOTE — Progress Notes (Signed)
   Subjective:    Patient ID: Melody Sutton, female    DOB: 11-29-1950, 73 y.o.   MRN: [id number]  HPI Here to follow up on an ED visit on 06-06-24 for high BP readings. We saw her on 05-04-24 and we stopped her Chlorthalidone  and increased her Atenolol  to 100 mg daily. She presented to the ED with a mild headache, and her systolic readings at home had been from the 170's to the 200's. Her exam, EKG, labs, and head CT were al unremarkable. She was started on Amlodipine 10 mg daily and told to follow up with us . Since then she has not had a headache but she continues to feel stressed. We had also started her on Lexapro  10 mg daily for anxiety. She says this has not done much for her.    Review of Systems  Constitutional: Negative.   Respiratory: Negative.    Cardiovascular: Negative.   Neurological: Negative.        Objective:   Physical Exam Constitutional:      Appearance: Normal appearance.  Cardiovascular:     Rate and Rhythm: Normal rate and regular rhythm.     Pulses: Normal pulses.     Heart sounds: Normal heart sounds.  Pulmonary:     Effort: Pulmonary effort is normal.     Breath sounds: Normal breath sounds.  Neurological:     Mental Status: She is alert.           Assessment & Plan:  For the anxiety, she will increase the Lexapro  to 20 mg daily. For the HTN, we will stop the Atenolol  and will start her on Carvedilol 12.5 mg BID. Follow up here in 2-3 weeks. I personally spent a total of 35 minutes in the care of the patient today including getting/reviewing separately obtained history, performing a medically appropriate exam/evaluation, independently interpreting results, and communicating results.  Garnette Olmsted, MD

## 2024-06-10 ENCOUNTER — Telehealth: Payer: Self-pay | Admitting: Family Medicine

## 2024-06-10 NOTE — Telephone Encounter (Signed)
 Copied from CRM [id number]. Topic: Referral - Request for Referral >> Jun 10, 2024 11:14 AM Drema MATSU wrote: Did the patient discuss referral with their provider in the last year? No (If No - schedule appointment) (If Yes - send message)  Appointment offered? Yes  Type of order/referral and detailed reason for visit: referral to Audiology for hearing test to get hearing aids  Preference of office, provider, location: close by   If referral order, have you been seen by this specialty before? No (If Yes, this issue or another issue? When? Where?  Can we respond through MyChart? Yes

## 2024-06-16 DIAGNOSIS — G4733 Obstructive sleep apnea (adult) (pediatric): Secondary | ICD-10-CM | POA: Diagnosis not present

## 2024-06-18 ENCOUNTER — Ambulatory Visit: Payer: Self-pay

## 2024-06-24 ENCOUNTER — Encounter: Payer: Self-pay | Admitting: Sleep Medicine

## 2024-06-24 ENCOUNTER — Ambulatory Visit: Admitting: Sleep Medicine

## 2024-06-24 VITALS — BP 132/72 | HR 63 | Temp 98.0°F | Ht 65.5 in | Wt 123.6 lb

## 2024-06-24 DIAGNOSIS — I10 Essential (primary) hypertension: Secondary | ICD-10-CM

## 2024-06-24 DIAGNOSIS — G4733 Obstructive sleep apnea (adult) (pediatric): Secondary | ICD-10-CM

## 2024-06-24 NOTE — Patient Instructions (Signed)

## 2024-06-24 NOTE — Progress Notes (Signed)
 Name:Melody Sutton MRN: [id number] DOB: 06-28-1951   CHIEF COMPLAINT:  CPAP F/U   HISTORY OF PRESENT ILLNESS: Melody Sutton is a 73 y.o. w/ a h/o HTN, anxiety, depression and insomnia who presents to follow up on HST results. The patient underwent HST which revealed severe OSA (AHI 39, O2 nadir 69%).     EPWORTH SLEEP SCORE 17    05/13/2024    2:23 PM  Results of the Epworth flowsheet  Sitting and reading 3  Watching TV 3  Sitting, inactive in a public place (e.g. a theatre or a meeting) 3  As a passenger in a car for an hour without a break 1  Lying down to rest in the afternoon when circumstances permit 3  Sitting and talking to someone 1  Sitting quietly after a lunch without alcohol 3  In a car, while stopped for a few minutes in traffic 0  Total score 17    PAST MEDICAL HISTORY :   has a past medical history of Allergy, Cataract (2023), CIN III (cervical intraepithelial neoplasia grade III) with severe dysplasia, Dysmenorrhea, Granuloma annulare, Hyperlipidemia, Hypertension, and Ovarian cyst.  has a past surgical history that includes Vaginal hysterectomy; Tubal ligation; Ovarian cyst surgery; Colonoscopy (05/29/2023); Polypectomy; Nasal sinus surgery; Foot surgery (Left); and Cataract extraction (Bilateral, 2023). Prior to Admission medications   Medication Sig Start Date End Date Taking? Authorizing Provider  Acetaminophen (TYLENOL PO) Take by mouth daily.    [provider]  atenolol  (TENORMIN ) 100 MG tablet Take 1 tablet (100 mg total) by mouth daily. 05/04/24   Johnny Garnette LABOR, MD  atorvastatin  (LIPITOR) 40 MG tablet TAKE 1 TABLET BY MOUTH DAILY 08/01/23   Johnny Garnette LABOR, MD  Calcium -Magnesium-Vitamin D  (CALCIUM  1200+D3 PO) Take by mouth.    [provider]  escitalopram  (LEXAPRO ) 10 MG tablet Take 1 tablet (10 mg total) by mouth daily. 05/04/24   Johnny Garnette LABOR, MD  meloxicam  (MOBIC ) 15 MG tablet TAKE 1 TABLET BY MOUTH DAILY 04/03/24   Johnny Garnette LABOR, MD  potassium chloride  SA (KLOR-CON  M) 20 MEQ tablet TAKE 1 TABLET BY MOUTH DAILY 10/03/23   Johnny Garnette LABOR, MD  valsartan  (DIOVAN ) 320 MG tablet Take 1 tablet (320 mg total) by mouth daily. 03/30/24   Johnny Garnette LABOR, MD  zolpidem  (AMBIEN ) 10 MG tablet TAKE 1 TABLET BY MOUTH AT  BEDTIME AS NEEDED FOR SLEEP 03/02/24   Johnny Garnette LABOR, MD   No Known Allergies  FAMILY HISTORY:  family history includes Breast cancer in her maternal aunt; Cancer in her maternal aunt; Diabetes in her maternal uncle; Hypertension in her mother. SOCIAL HISTORY:  reports that she quit smoking about 33 years ago. Her smoking use included cigarettes. She started smoking about 65 years ago. She has a 32 pack-year smoking history. She has never used smokeless tobacco. She reports current alcohol use of about 7.0 standard drinks of alcohol per week. She reports current drug use. Drug: Marijuana.   Review of Systems:  Gen:  Denies  fever, sweats, chills weight loss  HEENT: Denies blurred vision, double vision, ear pain, eye pain, hearing loss, nose bleeds, sore throat Cardiac:  No dizziness, chest pain or heaviness, chest tightness,edema, No JVD Resp:   No cough, -sputum production, -shortness of breath,-wheezing, -hemoptysis,  Gi: Denies swallowing difficulty, stomach pain, nausea or vomiting, diarrhea, constipation, bowel incontinence Gu:  Denies bladder incontinence, burning urine Ext:   Denies Joint pain, stiffness or  swelling Skin: Denies  skin rash, easy bruising or bleeding or hives Endoc:  Denies polyuria, polydipsia , polyphagia or weight change Psych:   Denies depression, insomnia or hallucinations  Other:  All other systems negative  VITAL SIGNS: BP 132/72   Pulse 63   Temp 98 F (36.7 C) (Temporal)   Ht 5' 5.5 (1.664 m)   Wt 123 lb 9.6 oz (56.1 kg)   LMP 09/10/1978   SpO2 97%   BMI 20.26 kg/m    Physical Examination:   General Appearance: No distress  EYES PERRLA, EOM intact.   NECK  Supple, No JVD Pulmonary: normal breath sounds, No wheezing.  CardiovascularNormal S1,S2.  No m/r/g.   Abdomen: Benign, Soft, non-tender. Skin:   warm, no rashes, no ecchymosis  Extremities: normal, no cyanosis, clubbing. Neuro:without focal findings,  speech normal  PSYCHIATRIC: Mood, affect within normal limits.   ASSESSMENT AND PLAN  OSA Reviewed HST results with patient. Starting patient on APAP therapy set to 4-16 cm H2O. Discussed the consequences of untreated sleep apnea. Advised not to drive drowsy for safety of patient and others. Will follow up in 3 months.    HTN Stable, on current management. Following with PCP.    Patient  satisfied with Plan of action and management. All questions answered  I spent a total of 34 minutes reviewing chart data, face-to-face evaluation with the patient, counseling and coordination of care as detailed above.    Garl Speigner, M.D.  Sleep Medicine [facility]

## 2024-06-26 ENCOUNTER — Encounter: Payer: Self-pay | Admitting: Adult Health

## 2024-06-26 ENCOUNTER — Other Ambulatory Visit: Payer: Self-pay

## 2024-06-26 ENCOUNTER — Ambulatory Visit: Admitting: Adult Health

## 2024-06-26 VITALS — BP 120/80 | HR 70 | Temp 97.9°F | Ht 65.5 in | Wt 123.0 lb

## 2024-06-26 DIAGNOSIS — J029 Acute pharyngitis, unspecified: Secondary | ICD-10-CM | POA: Diagnosis not present

## 2024-06-26 DIAGNOSIS — J0100 Acute maxillary sinusitis, unspecified: Secondary | ICD-10-CM | POA: Diagnosis not present

## 2024-06-26 DIAGNOSIS — G4733 Obstructive sleep apnea (adult) (pediatric): Secondary | ICD-10-CM

## 2024-06-26 LAB — POC COVID19 BINAXNOW: SARS Coronavirus 2 Ag: NEGATIVE

## 2024-06-26 LAB — POCT RAPID STREP A (OFFICE): Rapid Strep A Screen: NEGATIVE

## 2024-06-26 MED ORDER — AMOXICILLIN-POT CLAVULANATE 875-125 MG PO TABS: 1.0000 | ORAL_TABLET | Freq: Two times a day (BID) | ORAL | 0 refills | Status: DC

## 2024-06-26 NOTE — Progress Notes (Signed)
 Subjective:    Patient ID: Melody Sutton, female    DOB: 05-Oct-1950, 73 y.o.   MRN: [id number]  HPI 73 year old female who  has a past medical history of Allergy, Cataract (2023), CIN III (cervical intraepithelial neoplasia grade III) with severe dysplasia, Dysmenorrhea, Granuloma annulare, Hyperlipidemia, Hypertension, and Ovarian cyst.  Discussed the use of AI scribe software for clinical note transcription with the patient, who gave verbal consent to proceed.  History of Present Illness   She presents to the office today for an acute issue.   Symptoms began three days ago, including cough, nasal congestion, headache, sinus pressure, tooth pain, sore throat, and a low-grade fever. The cough initially produced constant sputum but now only occasionally brings up mucus. Sinus pressure is across the top of her face, with a burning sensation in her teeth.  She experiences recurrent sinus infections almost twice a year for over forty years,  She has been using HydroVet, which provided some relief.        Review of Systems See HPI   Past Medical History:  Diagnosis Date   Allergy    Cataract 2023   Dr. Lavonia   CIN III (cervical intraepithelial neoplasia grade III) with severe dysplasia    Dysmenorrhea    Granuloma annulare    Hyperlipidemia    Hypertension    Ovarian cyst     Social History   Socioeconomic History   Marital status: Married    Spouse name: Not on file   Number of children: Not on file   Years of education: Not on file   Highest education level: Some college, no degree  Occupational History   Not on file  Tobacco Use   Smoking status: Former    Current packs/day: 0.00    Average packs/day: 1 pack/day for 32.0 years (32.0 ttl pk-yrs)    Types: Cigarettes    Start date: 06/11/1959    Quit date: 06/11/1991    Years since quitting: 33.0   Smokeless tobacco: Never  Vaping Use   Vaping status: Never Used  Substance and Sexual Activity   Alcohol use:  Yes    Alcohol/week: 7.0 standard drinks of alcohol    Types: 7 Glasses of wine per week    Comment: daily ( 2)    Drug use: Yes    Types: Marijuana   Sexual activity: Yes    Partners: Male    Birth control/protection: Surgical    Comment: 1st intercourse- 16, partners-  5, married- 35 yrs   Other Topics Concern   Not on file  Social History Narrative   Not on file   Social Drivers of Health   Financial Resource Strain: Low Risk  (02/26/2024)   Overall Financial Resource Strain (CARDIA)    Difficulty of Paying Living Expenses: Not hard at all  Food Insecurity: No Food Insecurity (02/26/2024)   Hunger Vital Sign    Worried About Running Out of Food in the Last Year: Never true    Ran Out of Food in the Last Year: Never true  Transportation Needs: No Transportation Needs (02/26/2024)   PRAPARE - Administrator, Civil Service (Medical): No    Lack of Transportation (Non-Medical): No  Physical Activity: Inactive (02/26/2024)   Exercise Vital Sign    Days of Exercise per Week: 0 days    Minutes of Exercise per Session: 0 min  Stress: No Stress Concern Present (02/26/2024)   [employer] of Occupational Health -  Occupational Stress Questionnaire    Feeling of Stress: Not at all  Social Connections: Moderately Isolated (02/26/2024)   Social Connection and Isolation Panel    Frequency of Communication with Friends and Family: More than three times a week    Frequency of Social Gatherings with Friends and Family: More than three times a week    Attends Religious Services: Never    Database administrator or Organizations: No    Attends Banker Meetings: Never    Marital Status: Married  Catering manager Violence: Not At Risk (02/26/2024)   Humiliation, Afraid, Rape, and Kick questionnaire    Fear of Current or Ex-Partner: No    Emotionally Abused: No    Physically Abused: No    Sexually Abused: No    Past Surgical History:  Procedure Laterality Date    CATARACT EXTRACTION Bilateral 2023   COLONOSCOPY  05/29/2023   per Dr. Shila, precancerous polyps, no repeats needed   FOOT SURGERY Left    NASAL SINUS SURGERY     30+ yeras ago   OVARIAN CYST SURGERY     POLYPECTOMY     TA+   TUBAL LIGATION     VAGINAL HYSTERECTOMY      Family History  Problem Relation Age of Onset   Hypertension Mother    Breast cancer Maternal Aunt    Cancer Maternal Aunt        Vulvar Ca in situ   Diabetes Maternal Uncle    Colon cancer Neg Hx    Pancreatic cancer Neg Hx    Stomach cancer Neg Hx    Colon polyps Neg Hx    Esophageal cancer Neg Hx    Rectal cancer Neg Hx     No Known Allergies  Current Outpatient Medications on File Prior to Visit  Medication Sig Dispense Refill   Acetaminophen (TYLENOL PO) Take by mouth daily.     amLODipine (NORVASC) 10 MG tablet Take 1 tablet (10 mg total) by mouth daily. 30 tablet 3   atorvastatin  (LIPITOR) 40 MG tablet TAKE 1 TABLET BY MOUTH DAILY 90 tablet 3   Calcium -Magnesium-Vitamin D  (CALCIUM  1200+D3 PO) Take by mouth.     carvedilol (COREG) 12.5 MG tablet Take 1 tablet (12.5 mg total) by mouth 2 (two) times daily with a meal. 60 tablet 3   escitalopram  (LEXAPRO ) 10 MG tablet Take 2 tablets (20 mg total) by mouth daily.     meloxicam  (MOBIC ) 15 MG tablet TAKE 1 TABLET BY MOUTH DAILY 90 tablet 3   potassium chloride  SA (KLOR-CON  M) 20 MEQ tablet TAKE 1 TABLET BY MOUTH DAILY 90 tablet 3   prednisoLONE acetate (PRED FORTE) 1 % ophthalmic suspension Place 1 drop into the left eye 3 (three) times daily.     valsartan  (DIOVAN ) 320 MG tablet Take 1 tablet (320 mg total) by mouth daily. 90 tablet 3   zolpidem  (AMBIEN ) 10 MG tablet TAKE 1 TABLET BY MOUTH AT  BEDTIME AS NEEDED FOR SLEEP 90 tablet 1   Current Facility-Administered Medications on File Prior to Visit  Medication Dose Route Frequency Provider Last Rate Last Admin   lidocaine -EPINEPHrine  (XYLOCAINE  W/EPI) 1 %-1:100000 (with pres) injection 2 mL  2 mL  Infiltration Once         BP 120/80   Pulse 70   Temp 97.9 F (36.6 C) (Oral)   Ht 5' 5.5 (1.664 m)   Wt 123 lb (55.8 kg)   LMP 09/10/1978   SpO2 92%  BMI 20.16 kg/m       Objective:   Physical Exam Vitals and nursing note reviewed.  Constitutional:      Appearance: Normal appearance. She is ill-appearing.  HENT:     Nose: Congestion present. No rhinorrhea.     Right Turbinates: Enlarged and swollen.     Left Turbinates: Enlarged and swollen.     Right Sinus: Maxillary sinus tenderness present.     Left Sinus: Maxillary sinus tenderness present.     Mouth/Throat:     Pharynx: No pharyngeal swelling, oropharyngeal exudate, posterior oropharyngeal erythema or postnasal drip.     Tonsils: No tonsillar exudate or tonsillar abscesses.  Cardiovascular:     Rate and Rhythm: Normal rate and regular rhythm.     Pulses: Normal pulses.     Heart sounds: Normal heart sounds.  Pulmonary:     Effort: Pulmonary effort is normal.     Breath sounds: Normal breath sounds.  Skin:    General: Skin is warm and dry.  Neurological:     General: No focal deficit present.     Mental Status: She is alert and oriented to person, place, and time.  Psychiatric:        Mood and Affect: Mood normal.        Behavior: Behavior normal.        Thought Content: Thought content normal.        Judgment: Judgment normal.        Assessment & Plan:  1. Acute non-recurrent maxillary sinusitis (Primary) - She does appear to be acutely ill. Will start her on Augmentin . She can continue with already prescribed Hydromet.  - Follow up if not improving in the next 2-3 days  - amoxicillin -clavulanate (AUGMENTIN ) 875-125 MG tablet; Take 1 tablet by mouth 2 (two) times daily.  Dispense: 20 tablet; Refill: 0  2. Sore throat  - POC COVID-19- negative - POC Rapid Strep A- negative   Darleene Shape, NP  I personally spent a total of 31 minutes in the care of the patient today including preparing to see the  patient, getting/reviewing separately obtained history, performing a medically appropriate exam/evaluation, counseling and educating, placing orders, documenting clinical information in the EHR, and independently interpreting results.

## 2024-06-26 NOTE — Addendum Note (Signed)
 Addended by: Sophira Rumler J on: 06/26/2024 11:13 AM   Modules accepted: Orders

## 2024-06-30 ENCOUNTER — Encounter: Payer: Self-pay | Admitting: Family Medicine

## 2024-06-30 DIAGNOSIS — H9190 Unspecified hearing loss, unspecified ear: Secondary | ICD-10-CM

## 2024-06-30 NOTE — Telephone Encounter (Signed)
 No hearing test referral in pt chart, please advise if ok to place referral

## 2024-06-30 NOTE — Telephone Encounter (Signed)
 I sent in the referral to Audiology

## 2024-07-07 ENCOUNTER — Ambulatory Visit: Admitting: Family Medicine

## 2024-07-07 ENCOUNTER — Encounter: Payer: Self-pay | Admitting: Family Medicine

## 2024-07-07 VITALS — BP 118/78 | HR 92 | Temp 98.2°F | Wt 123.8 lb

## 2024-07-07 DIAGNOSIS — J0100 Acute maxillary sinusitis, unspecified: Secondary | ICD-10-CM | POA: Diagnosis not present

## 2024-07-07 DIAGNOSIS — F419 Anxiety disorder, unspecified: Secondary | ICD-10-CM | POA: Diagnosis not present

## 2024-07-07 DIAGNOSIS — I10 Essential (primary) hypertension: Secondary | ICD-10-CM

## 2024-07-07 MED ORDER — ESCITALOPRAM OXALATE 20 MG PO TABS: 20.0000 mg | ORAL_TABLET | Freq: Every day | ORAL | 3 refills | Status: DC

## 2024-07-07 MED ORDER — CARVEDILOL 12.5 MG PO TABS: 12.5000 mg | ORAL_TABLET | Freq: Two times a day (BID) | ORAL | 3 refills | Status: AC

## 2024-07-07 MED ORDER — AMLODIPINE BESYLATE 10 MG PO TABS: 10.0000 mg | ORAL_TABLET | Freq: Every day | ORAL | 3 refills | Status: AC

## 2024-07-07 MED ORDER — LEVOFLOXACIN 500 MG PO TABS: 500.0000 mg | ORAL_TABLET | Freq: Every day | ORAL | 0 refills | Status: AC

## 2024-07-07 NOTE — Progress Notes (Signed)
   Subjective:    Patient ID: Melody Sutton, female    DOB: 1950-10-08, 73 y.o.   MRN: [id number]  HPI Here to follow up on HTN and anxiety, as well as a sinus infection. When we saw her 4 weeks ago we changed her BP medications to Amlodipine, Valsartan , and Carvedilol, and we increased her Lexapro  to 20 mg daily. She is veyr pleased with the results. Her BP has been well controlled at home, and her anxiety is more manageable. Also she was seen here on 06-26-24 with a sinusitis, and she was given 10 days of Augmentin . She says this did not help at all. She still has sinus congestion, blowing green mucus from the nose, PND, and a dry cough. No fever.    Review of Systems  Constitutional: Negative.   HENT:  Positive for congestion, postnasal drip and sinus pressure. Negative for ear pain and sore throat.   Eyes: Negative.   Respiratory:  Positive for cough. Negative for shortness of breath and wheezing.   Cardiovascular: Negative.   Psychiatric/Behavioral:  Negative for agitation and dysphoric mood. The patient is not nervous/anxious.        Objective:   Physical Exam Constitutional:      Appearance: Normal appearance. She is not ill-appearing.  HENT:     Right Ear: Tympanic membrane, ear canal and external ear normal.     Left Ear: Tympanic membrane, ear canal and external ear normal.     Nose: Nose normal.     Mouth/Throat:     Pharynx: Oropharynx is clear.  Eyes:     Conjunctiva/sclera: Conjunctivae normal.  Cardiovascular:     Rate and Rhythm: Normal rate and regular rhythm.     Pulses: Normal pulses.     Heart sounds: Normal heart sounds.  Pulmonary:     Effort: Pulmonary effort is normal.     Breath sounds: Normal breath sounds.  Lymphadenopathy:     Cervical: No cervical adenopathy.  Neurological:     Mental Status: She is alert.  Psychiatric:        Mood and Affect: Mood normal.        Behavior: Behavior normal.        Thought Content: Thought content normal.            Assessment & Plan:  Her HTN and anxiety are now well controlled. For the sinusitis, she will try 10 days of Levaquin.  Garnette Olmsted, MD

## 2024-07-21 ENCOUNTER — Encounter: Payer: Self-pay | Admitting: Family Medicine

## 2024-07-21 NOTE — Telephone Encounter (Signed)
 I already did this on 07-07-24

## 2024-08-05 ENCOUNTER — Encounter: Payer: Self-pay | Admitting: Family Medicine

## 2024-08-27 LAB — HM MAMMOGRAPHY

## 2024-08-31 ENCOUNTER — Encounter: Payer: Self-pay | Admitting: Family Medicine

## 2024-09-19 ENCOUNTER — Other Ambulatory Visit: Payer: Self-pay | Admitting: Family Medicine

## 2024-09-21 ENCOUNTER — Telehealth: Payer: Self-pay | Admitting: *Deleted

## 2024-09-21 ENCOUNTER — Other Ambulatory Visit: Payer: Self-pay

## 2024-09-21 MED ORDER — ESCITALOPRAM OXALATE 20 MG PO TABS: 20.0000 mg | ORAL_TABLET | Freq: Every day | ORAL | 0 refills | Status: AC

## 2024-09-21 NOTE — Telephone Encounter (Signed)
 Spoke with [facility] pharmacy advised of pt request to refill the Lexapro  new dose of 20 mg, Agent stated that they will be shipping out pt prescription ASAP, short term for 7 days supply sent to local pharmacy since pt only has 1 tablet left . Pt notified

## 2024-09-21 NOTE — Telephone Encounter (Signed)
 Copied from CRM [id number]. Topic: Clinical - Medication Question >> Sep 21, 2024  9:38 AM [name] wrote: Reason for CRM: Patient called regarding prescription  escitalopram  (LEXAPRO ) 20 MG tablet  stated the pharmacy that came in is for 10 MG instead of 20 MG , Been taking 2 so she is out early , wants to know if she can call a one month   [id number] - [place], [place] - [address] AT [facility] OF [address] [place] [address] Phone: [phone] Fax: [phone] Hours: Open 24 hours

## 2024-09-29 ENCOUNTER — Other Ambulatory Visit: Payer: Self-pay | Admitting: Family Medicine

## 2024-10-05 ENCOUNTER — Ambulatory Visit: Admitting: Sleep Medicine

## 2024-10-13 ENCOUNTER — Ambulatory Visit: Admitting: Sleep Medicine

## 2024-10-19 ENCOUNTER — Ambulatory Visit: Admitting: Sleep Medicine

## 2025-03-03 ENCOUNTER — Ambulatory Visit
